# Patient Record
Sex: Male | Born: 1965 | Race: White | Hispanic: No | Marital: Married | Smoking: Never smoker
Health system: Southern US, Community
[De-identification: ages and names within clinical notes are randomized; demographics above are authoritative.]

## PROBLEM LIST (undated history)

## (undated) DIAGNOSIS — E782 Mixed hyperlipidemia: Secondary | ICD-10-CM

## (undated) DIAGNOSIS — K859 Acute pancreatitis without necrosis or infection, unspecified: Secondary | ICD-10-CM

## (undated) DIAGNOSIS — J189 Pneumonia, unspecified organism: Secondary | ICD-10-CM

## (undated) DIAGNOSIS — G4730 Sleep apnea, unspecified: Secondary | ICD-10-CM

## (undated) DIAGNOSIS — I10 Essential (primary) hypertension: Secondary | ICD-10-CM

## (undated) DIAGNOSIS — E119 Type 2 diabetes mellitus without complications: Secondary | ICD-10-CM

## (undated) DIAGNOSIS — N200 Calculus of kidney: Secondary | ICD-10-CM

## (undated) DIAGNOSIS — IMO0002 Reserved for concepts with insufficient information to code with codable children: Secondary | ICD-10-CM

## (undated) HISTORY — PX: EYE SURGERY: SHX253

---

## 1997-01-31 DIAGNOSIS — K859 Acute pancreatitis without necrosis or infection, unspecified: Secondary | ICD-10-CM

## 1997-01-31 HISTORY — DX: Acute pancreatitis without necrosis or infection, unspecified: K85.90

## 2001-05-16 ENCOUNTER — Encounter: Payer: Self-pay | Admitting: Emergency Medicine

## 2001-05-16 ENCOUNTER — Emergency Department (HOSPITAL_COMMUNITY): Admission: EM | Admit: 2001-05-16 | Discharge: 2001-05-16 | Payer: Self-pay | Admitting: Emergency Medicine

## 2002-05-31 ENCOUNTER — Emergency Department (HOSPITAL_COMMUNITY): Admission: EM | Admit: 2002-05-31 | Discharge: 2002-05-31 | Payer: Self-pay | Admitting: *Deleted

## 2002-06-01 ENCOUNTER — Emergency Department (HOSPITAL_COMMUNITY): Admission: EM | Admit: 2002-06-01 | Discharge: 2002-06-01 | Payer: Self-pay | Admitting: Emergency Medicine

## 2009-11-16 ENCOUNTER — Emergency Department (HOSPITAL_COMMUNITY): Admission: EM | Admit: 2009-11-16 | Discharge: 2009-11-16 | Payer: Self-pay | Admitting: Emergency Medicine

## 2011-06-15 ENCOUNTER — Encounter (INDEPENDENT_AMBULATORY_CARE_PROVIDER_SITE_OTHER): Payer: BC Managed Care – PPO | Admitting: Ophthalmology

## 2011-06-15 DIAGNOSIS — H4310 Vitreous hemorrhage, unspecified eye: Secondary | ICD-10-CM

## 2011-06-15 DIAGNOSIS — E11359 Type 2 diabetes mellitus with proliferative diabetic retinopathy without macular edema: Secondary | ICD-10-CM

## 2011-06-15 DIAGNOSIS — H43819 Vitreous degeneration, unspecified eye: Secondary | ICD-10-CM

## 2011-06-15 DIAGNOSIS — H35039 Hypertensive retinopathy, unspecified eye: Secondary | ICD-10-CM

## 2011-06-15 DIAGNOSIS — H2510 Age-related nuclear cataract, unspecified eye: Secondary | ICD-10-CM

## 2011-06-23 ENCOUNTER — Encounter (INDEPENDENT_AMBULATORY_CARE_PROVIDER_SITE_OTHER): Payer: BC Managed Care – PPO | Admitting: Ophthalmology

## 2011-06-23 DIAGNOSIS — E1065 Type 1 diabetes mellitus with hyperglycemia: Secondary | ICD-10-CM

## 2011-06-23 DIAGNOSIS — H4310 Vitreous hemorrhage, unspecified eye: Secondary | ICD-10-CM

## 2011-06-23 DIAGNOSIS — E11311 Type 2 diabetes mellitus with unspecified diabetic retinopathy with macular edema: Secondary | ICD-10-CM

## 2011-06-23 DIAGNOSIS — H35039 Hypertensive retinopathy, unspecified eye: Secondary | ICD-10-CM

## 2011-06-23 DIAGNOSIS — H43819 Vitreous degeneration, unspecified eye: Secondary | ICD-10-CM

## 2011-06-23 DIAGNOSIS — E1039 Type 1 diabetes mellitus with other diabetic ophthalmic complication: Secondary | ICD-10-CM

## 2011-06-23 DIAGNOSIS — E11359 Type 2 diabetes mellitus with proliferative diabetic retinopathy without macular edema: Secondary | ICD-10-CM

## 2011-06-23 DIAGNOSIS — I10 Essential (primary) hypertension: Secondary | ICD-10-CM

## 2011-06-29 LAB — PULMONARY FUNCTION TEST

## 2011-07-04 ENCOUNTER — Other Ambulatory Visit (INDEPENDENT_AMBULATORY_CARE_PROVIDER_SITE_OTHER): Payer: BC Managed Care – PPO | Admitting: Ophthalmology

## 2011-07-04 DIAGNOSIS — H3581 Retinal edema: Secondary | ICD-10-CM

## 2011-07-21 ENCOUNTER — Encounter (INDEPENDENT_AMBULATORY_CARE_PROVIDER_SITE_OTHER): Payer: BC Managed Care – PPO | Admitting: Ophthalmology

## 2011-07-21 DIAGNOSIS — H43819 Vitreous degeneration, unspecified eye: Secondary | ICD-10-CM

## 2011-07-21 DIAGNOSIS — E1139 Type 2 diabetes mellitus with other diabetic ophthalmic complication: Secondary | ICD-10-CM

## 2011-07-21 DIAGNOSIS — E11311 Type 2 diabetes mellitus with unspecified diabetic retinopathy with macular edema: Secondary | ICD-10-CM

## 2011-07-21 DIAGNOSIS — I10 Essential (primary) hypertension: Secondary | ICD-10-CM

## 2011-07-21 DIAGNOSIS — E11359 Type 2 diabetes mellitus with proliferative diabetic retinopathy without macular edema: Secondary | ICD-10-CM

## 2011-07-21 DIAGNOSIS — H35039 Hypertensive retinopathy, unspecified eye: Secondary | ICD-10-CM

## 2011-08-03 ENCOUNTER — Ambulatory Visit (INDEPENDENT_AMBULATORY_CARE_PROVIDER_SITE_OTHER): Payer: BC Managed Care – PPO | Admitting: Ophthalmology

## 2011-08-03 DIAGNOSIS — E1039 Type 1 diabetes mellitus with other diabetic ophthalmic complication: Secondary | ICD-10-CM

## 2011-08-03 DIAGNOSIS — E11359 Type 2 diabetes mellitus with proliferative diabetic retinopathy without macular edema: Secondary | ICD-10-CM

## 2011-08-03 DIAGNOSIS — H4310 Vitreous hemorrhage, unspecified eye: Secondary | ICD-10-CM

## 2011-08-03 NOTE — H&P (Signed)
[name] is an 46 y.o. male.   Chief Complaint: poor vision left eye HPI: longstanding diabetic with vitreous hemorrhage left eye  No past medical history on file.  No past surgical history on file.  No family history on file. Social History:  does not have a smoking history on file. He does not have any smokeless tobacco history on file. His alcohol and drug histories not on file.  Allergies: Allergies not on file  No prescriptions prior to admission    Review of systems otherwise negative  There were no vitals taken for this visit.  Physical exam: Mental status: oriented x3. Eyes: See eye exam associated with this date of surgery in media tab.  Scanned in by scanning center Ears, Nose, Throat: within normal limits Neck: Within Normal limits General: within normal limits Chest: Within normal limits Breast: deferred Heart: Within normal limits Abdomen: Within normal limits LARGE GU: deferred Extremities: within normal limits Skin: within normal limits  Assessment/Plan Vitreous hemorrhage, proliferative diabetic retinopathy Plan: To [facility] for pars plana vitrectomy left eye with laser treatment, gas injection, membrane peel.  Sherrie George 08/03/2011, 12:07 PM

## 2011-08-08 ENCOUNTER — Encounter: Payer: Self-pay | Admitting: Cardiovascular Disease

## 2011-08-19 ENCOUNTER — Encounter (HOSPITAL_COMMUNITY): Payer: Self-pay

## 2011-08-29 ENCOUNTER — Encounter (HOSPITAL_COMMUNITY): Payer: Self-pay | Admitting: *Deleted

## 2011-08-29 ENCOUNTER — Encounter (HOSPITAL_COMMUNITY): Payer: Self-pay | Admitting: Vascular Surgery

## 2011-08-29 MED ORDER — GATIFLOXACIN 0.5 % OP SOLN: 1.0000 [drp] | OPHTHALMIC | Status: DC | PRN

## 2011-08-29 MED ORDER — PHENYLEPHRINE HCL 2.5 % OP SOLN: 1.0000 [drp] | OPHTHALMIC | Status: DC | PRN

## 2011-08-29 MED ORDER — DEXTROSE 5 % IV SOLN
3.0000 g | INTRAVENOUS | Status: DC
  Filled 2011-08-29: qty 3000

## 2011-08-29 MED ORDER — TROPICAMIDE 1 % OP SOLN: 1.0000 [drp] | OPHTHALMIC | Status: DC | PRN

## 2011-08-29 MED ORDER — CYCLOPENTOLATE HCL 1 % OP SOLN: 1.0000 [drp] | OPHTHALMIC | Status: DC | PRN

## 2011-08-29 NOTE — Consult Note (Signed)
Anesthesia Chart Review:  Patient is a 46 year old male scheduled for pars plana vitrectomy with laser treatment, gas injection, membrane peel, left eye on 08/30/11 by Dr. Ashley Royalty.  He is scheduled to be a same day work-up per Dr. Anastasio Auerbach office.  History includes morbid obesity (last documented weight in Epic is 310 with BMI 50), non-smoker, HTN, DM2, pancreatitis '99, OSA type sleeping patterns but no official diagnosis, hypercholesterolemia, kidney stones.  Endocrinologist is Dr. Lacretia Nicks. Adela Lank.  Labs from his office on 07/01/11 indicate that he probably has some underlying CKD as his Cr then was 1.8.  His PCP is Dr. Jeannetta Nap with [facility].  He is out of the office this afternoon, but I spoke with his PA Rosanne Ashing.  He apparently gave medical clearance for this procedure.  EKG (PCP) on 06/23/11 showed ST @ 121, right axis deviation, right BBB, anteroseptal infarct (age undetermined), inferior ST/T wave abnormality, low voltage in precordial leads.  He had a cardiopulmonary exercise test on 06/29/11 that showed an "abnormal response", indeterminate for myocardial dysfunction due to suboptimal peak cardiovascular stress load, severe resting tachycardia, accelerated diastolic blood pressure response with exercise, risk assessment for high risk surgery was indeterminate.  I left a message for Dr. Marylen Ponto to contact me.  I also reviewed with Anesthesiologist Dr. Jacklynn Bue.  Based on his risk factors and stress test results, recommend pre-operative Cardiology evaluation.  I notified Dr. Ashley Royalty.  I called an spoke with Mr. Heinlen as well.  I notified him that [facility] can see him on 09/19/11.  Currently he is very upset, and isn't sure if he will even agree to see a Cardiologist or have eye surgery.  He is going to contact Dr. Anastasio Auerbach office.  I updated Lisa at Dr. Anastasio Auerbach office.  Shonna Chock, PA-C

## 2011-08-30 ENCOUNTER — Ambulatory Visit (HOSPITAL_COMMUNITY): Admission: RE | Admit: 2011-08-30 | Payer: BC Managed Care – PPO | Source: Ambulatory Visit | Admitting: Ophthalmology

## 2011-08-30 ENCOUNTER — Encounter (HOSPITAL_COMMUNITY): Admission: RE | Payer: Self-pay | Source: Ambulatory Visit

## 2011-08-30 HISTORY — DX: Acute pancreatitis without necrosis or infection, unspecified: K85.90

## 2011-08-30 HISTORY — DX: Mixed hyperlipidemia: E78.2

## 2011-08-30 HISTORY — DX: Essential (primary) hypertension: I10

## 2011-08-30 HISTORY — DX: Sleep apnea, unspecified: G47.30

## 2011-08-30 SURGERY — PARS PLANA VITRECTOMY WITH 25 GAUGE
Anesthesia: General | Laterality: Left

## 2011-09-06 ENCOUNTER — Ambulatory Visit (INDEPENDENT_AMBULATORY_CARE_PROVIDER_SITE_OTHER): Payer: BC Managed Care – PPO | Admitting: Ophthalmology

## 2011-09-19 ENCOUNTER — Encounter: Payer: Self-pay | Admitting: *Deleted

## 2011-09-19 ENCOUNTER — Encounter: Payer: Self-pay | Admitting: Cardiovascular Disease

## 2011-09-19 ENCOUNTER — Ambulatory Visit (INDEPENDENT_AMBULATORY_CARE_PROVIDER_SITE_OTHER): Payer: BC Managed Care – PPO | Admitting: Cardiovascular Disease

## 2011-09-19 VITALS — BP 132/77 | HR 119 | Wt 309.0 lb

## 2011-09-19 DIAGNOSIS — Z01810 Encounter for preprocedural cardiovascular examination: Secondary | ICD-10-CM

## 2011-09-19 DIAGNOSIS — R0600 Dyspnea, unspecified: Secondary | ICD-10-CM

## 2011-09-19 DIAGNOSIS — R0989 Other specified symptoms and signs involving the circulatory and respiratory systems: Secondary | ICD-10-CM

## 2011-09-19 DIAGNOSIS — I10 Essential (primary) hypertension: Secondary | ICD-10-CM | POA: Insufficient documentation

## 2011-09-19 DIAGNOSIS — N189 Chronic kidney disease, unspecified: Secondary | ICD-10-CM | POA: Insufficient documentation

## 2011-09-19 DIAGNOSIS — R0609 Other forms of dyspnea: Secondary | ICD-10-CM

## 2011-09-19 DIAGNOSIS — I4892 Unspecified atrial flutter: Secondary | ICD-10-CM

## 2011-09-19 DIAGNOSIS — E782 Mixed hyperlipidemia: Secondary | ICD-10-CM | POA: Insufficient documentation

## 2011-09-19 DIAGNOSIS — K859 Acute pancreatitis without necrosis or infection, unspecified: Secondary | ICD-10-CM | POA: Insufficient documentation

## 2011-09-19 DIAGNOSIS — G4730 Sleep apnea, unspecified: Secondary | ICD-10-CM | POA: Insufficient documentation

## 2011-09-19 DIAGNOSIS — E119 Type 2 diabetes mellitus without complications: Secondary | ICD-10-CM

## 2011-09-19 DIAGNOSIS — E669 Obesity, unspecified: Secondary | ICD-10-CM | POA: Insufficient documentation

## 2011-09-19 DIAGNOSIS — I4891 Unspecified atrial fibrillation: Secondary | ICD-10-CM

## 2011-09-19 DIAGNOSIS — E1111 Type 2 diabetes mellitus with ketoacidosis with coma: Secondary | ICD-10-CM | POA: Insufficient documentation

## 2011-09-19 DIAGNOSIS — H4310 Vitreous hemorrhage, unspecified eye: Secondary | ICD-10-CM

## 2011-09-19 DIAGNOSIS — Z01818 Encounter for other preprocedural examination: Secondary | ICD-10-CM

## 2011-09-19 DIAGNOSIS — IMO0002 Reserved for concepts with insufficient information to code with codable children: Secondary | ICD-10-CM

## 2011-09-19 HISTORY — DX: Unspecified atrial fibrillation: I48.91

## 2011-09-19 HISTORY — DX: Reserved for concepts with insufficient information to code with codable children: IMO0002

## 2011-09-19 LAB — CBC WITH DIFFERENTIAL/PLATELET
Basophils Relative: 0.3 % (ref 0.0–3.0)
Eosinophils Relative: 3.7 % (ref 0.0–5.0)
HCT: 47.9 % (ref 39.0–52.0)
MCV: 83.8 fl (ref 78.0–100.0)
Monocytes Absolute: 0.9 10*3/uL (ref 0.1–1.0)
Neutrophils Relative %: 65.2 % (ref 43.0–77.0)
RBC: 5.72 Mil/uL (ref 4.22–5.81)
WBC: 9.5 10*3/uL (ref 4.5–10.5)

## 2011-09-19 LAB — BASIC METABOLIC PANEL
BUN: 34 mg/dL — ABNORMAL HIGH (ref 6–23)
CO2: 33 mEq/L — ABNORMAL HIGH (ref 19–32)
Chloride: 97 mEq/L (ref 96–112)
GFR: 66.05 mL/min (ref >60.00)
Glucose, Bld: 177 mg/dL — ABNORMAL HIGH (ref 70–99)
Potassium: 3.8 mEq/L (ref 3.5–5.1)

## 2011-09-19 NOTE — Patient Instructions (Addendum)
Your physician recommends that you schedule a follow-up appointment in: AFTER TEST DONE  WITH DR [facility] Your physician has recommended you make the following change in your medication: INCREASE TENORMIN TO  100 MG 2 TABS TWICE DAILY START XARELTO  20 MG  1 WITH EVENING MEAL You have been referred to  EP  FIRST AVAILABLE FOR POSSIBLE  FLUTTER ABLATION Your physician has requested that you have a lexiscan myoview. For further information please visit [email or web address] Please follow instruction sheet, as given. 2 DAY  PROTOCOL DX PRE OP Your physician has requested that you have an echocardiogram. Echocardiography is a painless test that uses sound waves to create images of your heart. It provides your doctor with information about the size and shape of your heart and how well your heart's chambers and valves are working. This procedure takes approximately one hour. There are no restrictions for this procedure. DX PRE OP  Your physician has requested that you have a TEE/Cardioversion. During a TEE, sound waves are used to create images of your heart. It provides your doctor with information about the size and shape of your heart and how well your heart's chambers and valves are working. In this test, a transducer is attached to the end of a flexible tube that is guided down you throat and into your esophagus (the tube leading from your mouth to your stomach) to get a more detailed image of your heart. Once the TEE has determined that a blood clot is not present, the cardioversion begins. Electrical Cardioversion uses a jolt of electricity to your heart either through paddles or wired patches attached to your chest. This is a controlled, usually prescheduled, procedure. This procedure is done at the hospital and you are not awake during the procedure. You usually go home the day of the procedure. Please see the instruction sheet given to you today for more information. DX AFLUTTER  DO NEXT WEEK  WITH DR  Eden Emms  AFTER TESTS Your physician recommends that you return for lab work in: TODAY  BMET CBC INR  DX V72.81  A FLUTTER

## 2011-09-19 NOTE — Assessment & Plan Note (Signed)
Cholesterol is at goal.  Continue current dose of statin and diet Rx.  No myalgias or side effects.  F/U  LFT's in 6 months. No results found for this basename: LDLCALC             

## 2011-09-19 NOTE — Assessment & Plan Note (Signed)
Responable fo rapid HR. Multiple issues regarding anticoagulation and rate control as well as Rx.  Increase atenolol.  Echo and two day myovue. Xarelto will check Cr.  TEE/DCC next week and F/U EPS for ? Flutter ablation.  Risk of stroke and rational for Rx discussed with patient

## 2011-09-19 NOTE — Progress Notes (Signed)
Patient ID: [name], male   DOB: 02/05/1965, 46 y.o.   MRN: [id number] 46 yo DM 21 years.  Sent by Dr Mathews for preop clearance Morbidly obese.  Poor diabetic control with A1 as high as 12.  No previous cardiac issues Had a cardiopulm stress test at [facility] 5/29  And could not exercise but 2.5 minutes with no obvious cardiac limitation.  Noted to have higher HR last few months.  Preop evaluation HR 115 ECG read as sinus tachycardia but is in fib/flutter.  Confirmed on ECG in our office Flutter rate of 119 on Atenolol 75mg/day already.  Denies chest pain palpitations.  Sees Dr Powell not clear if kidneys a bad.  Had edema and lost 40 lbs after being put on lasix.  More dyspnic last few weeks  Has comtemplated bariatric surgery .  Seen Das[facility] at [facility]  Dad had successful surgery.    Has diabetic retinopathy.  Has had injections and laser surgery before  Needed more surgery on left eye requiring anesthesia which was canceled due to need for cardiac  evaluaiton and tachycardia  Long discussion with patient about diagnosis of flutter.  Need for anticoagulation  Use xarelto and dose depending on BMET check today.  Continue beta blocker for rate control Will need echo and ischemic evaluation.  Discussed TEE/DCC as quickest way to restore rhythm   ROS: Denies fever, malais, weight loss, blurry vision, decreased visual acuity, cough, sputum, SOB, hemoptysis, pleuritic pain, palpitaitons, heartburn, abdominal pain, melena, lower extremity edema, claudication, or rash.  All other systems reviewed and negative   General: Affect appropriate Obese white male HEENT: normal Neck supple with no adenopathy JVP normal no bruits no thyromegaly Lungs clear with no wheezing and good diaphragmatic motion Heart:  S1/S2 no murmur,rub, gallop or click PMI normal Abdomen: benighn, BS positve, no tenderness, no AAA no bruit.  No HSM or HJR Distal pulses intact with no bruits Trace edema Neuro  non-focal Skin warm and dry No muscular weakness  Medications Current Outpatient Prescriptions  Medication Sig Dispense Refill  . aspirin EC 81 MG tablet Take 81 mg by mouth daily.      . atenolol (TENORMIN) 50 MG tablet 2 tabs am and 1 tab pm      . Canagliflozin (INVOKANA) 300 MG TABS Take 1 tablet by mouth daily.      . furosemide (LASIX) 40 MG tablet Take 40 mg by mouth daily.      . gemfibrozil (LOPID) 600 MG tablet Take 600 mg by mouth 2 (two) times daily before a meal.      . insulin aspart protamine-insulin aspart (NOVOLOG 70/30) (70-30) 100 UNIT/ML injection Inject 80 Units into the skin 2 (two) times daily with a meal.      . lisinopril (PRINIVIL,ZESTRIL) 20 MG tablet Take 20 mg by mouth daily.      . metFORMIN (GLUCOPHAGE) 1000 MG tablet Take 1,000 mg by mouth 2 (two) times daily with a meal.      . niacin 500 MG tablet Take 500 mg by mouth 2 (two) times daily with a meal.      . pravastatin (PRAVACHOL) 40 MG tablet Take 40 mg by mouth 2 (two) times daily.      . vitamin C (ASCORBIC ACID) 500 MG tablet Take 500 mg by mouth daily.       No current facility-administered medications for this visit.   Facility-Administered Medications Ordered in Other Visits  Medication Dose Route Frequency Provider   Last Rate Last Dose  . ceFAZolin (ANCEF) 3 g in dextrose 5 % 50 mL IVPB  3 g Intravenous 30 min Pre-Op John D Matthews, MD      . cyclopentolate (CYCLODRYL,CYCLOGYL) 1 % ophthalmic solution 1 drop  1 drop Left Eye PRN John D Matthews, MD      . gatifloxacin (ZYMAXID) 0.5 % ophthalmic drops 1 drop  1 drop Left Eye PRN John D Matthews, MD      . phenylephrine (MYDFRIN) 2.5 % ophthalmic solution 1 drop  1 drop Left Eye PRN John D Matthews, MD      . tropicamide (MYDRIACYL) 1 % ophthalmic solution 1 drop  1 drop Left Eye PRN John D Matthews, MD        Allergies Review of patient's allergies indicates no known allergies.  Family History: No family history on file.  Social  History: History   Social History  . Marital Status: Married    Spouse Name: N/A    Number of Children: N/A  . Years of Education: N/A   Occupational History  . Not on file.   Social History Main Topics  . Smoking status: Never Smoker   . Smokeless tobacco: Not on file  . Alcohol Use: No     Monthly - < 3 beers a month  . Drug Use: No  . Sexually Active:    Other Topics Concern  . Not on file   Social History Narrative  . No narrative on file    Electrocardiogram: Atrial flutter rate 119  RBBB  Assessment and Plan   

## 2011-09-19 NOTE — Assessment & Plan Note (Signed)
Will check BMET today to see if xarelto needs to be decreased in dosage  Try to get notes form Dr Lowell Guitar

## 2011-09-19 NOTE — Assessment & Plan Note (Signed)
Explained to him the connection between his obesity poor BS control and eye problems.  Again bariatric surgery would be his best option Discussed low carb diet.  Target hemoglobin A1c is 6.5 or less.  Continue current medications.

## 2011-09-19 NOTE — Assessment & Plan Note (Signed)
Well controlled.  Continue current medications and low sodium Dash type diet.    

## 2011-09-19 NOTE — Assessment & Plan Note (Signed)
He should have bariatric surgery.  Only thing that would materially change his life.  Will fix heart issues first then he will F/U in [facility]

## 2011-09-19 NOTE — Assessment & Plan Note (Signed)
Will have to postpone eye surgery for time being

## 2011-09-19 NOTE — Assessment & Plan Note (Signed)
Clinicall diagnosis  Advised patient to get tested as there is a relationship between this and PAF.  He would have trouble wearing CPAP.

## 2011-09-20 ENCOUNTER — Telehealth: Payer: Self-pay | Admitting: *Deleted

## 2011-09-20 ENCOUNTER — Encounter: Payer: Self-pay | Admitting: *Deleted

## 2011-09-20 NOTE — Telephone Encounter (Signed)
LEFT MESSAGE FOR PT TO CALL BACK RE SCHEDULING TEE CARDIOVERSION NEXT WEEK FOR A FLUTTER .[name]

## 2011-09-20 NOTE — Telephone Encounter (Signed)
TEE CARDIOVERSION SCHEDULED FOR 09-30-11 AT 12:00 WITH DR Eden Emms

## 2011-09-21 ENCOUNTER — Ambulatory Visit (HOSPITAL_COMMUNITY): Payer: BC Managed Care – PPO | Attending: Cardiology

## 2011-09-21 ENCOUNTER — Other Ambulatory Visit: Payer: Self-pay

## 2011-09-21 DIAGNOSIS — I4892 Unspecified atrial flutter: Secondary | ICD-10-CM | POA: Insufficient documentation

## 2011-09-21 DIAGNOSIS — I10 Essential (primary) hypertension: Secondary | ICD-10-CM | POA: Insufficient documentation

## 2011-09-21 DIAGNOSIS — E119 Type 2 diabetes mellitus without complications: Secondary | ICD-10-CM | POA: Insufficient documentation

## 2011-09-21 DIAGNOSIS — Z01818 Encounter for other preprocedural examination: Secondary | ICD-10-CM

## 2011-09-21 DIAGNOSIS — R0600 Dyspnea, unspecified: Secondary | ICD-10-CM

## 2011-09-23 ENCOUNTER — Other Ambulatory Visit: Payer: Self-pay | Admitting: Cardiovascular Disease

## 2011-09-23 DIAGNOSIS — I4892 Unspecified atrial flutter: Secondary | ICD-10-CM

## 2011-09-26 ENCOUNTER — Telehealth: Payer: Self-pay | Admitting: Cardiovascular Disease

## 2011-09-26 MED ORDER — RIVAROXABAN 20 MG PO TABS: 20.0000 mg | ORAL_TABLET | Freq: Every day | ORAL | Status: DC

## 2011-09-26 NOTE — Telephone Encounter (Signed)
Pt as given samples of xarelto, and rx was to be called into [facility] [place], and as of today that there, pls call in as ap

## 2011-09-29 ENCOUNTER — Ambulatory Visit (HOSPITAL_COMMUNITY): Payer: BC Managed Care – PPO | Admitting: Radiology

## 2011-09-29 ENCOUNTER — Encounter (HOSPITAL_COMMUNITY): Payer: Self-pay | Admitting: Radiology

## 2011-09-29 NOTE — Progress Notes (Deleted)
[facility] SITE 3 NUCLEAR MED [address] [place] [address] [phone]  Cardiology Nuclear Med Study  [name] is a 46 y.o. male     MRN : [id number]     DOB: 26-Apr-1965  Procedure Date: 09/29/2011  Nuclear Med Background Indication for Stress Test:  Evaluation for Ischemia, Abnormal EKG(Atrial Flutter), and Pending Surgical Clearance for (L) eye surgery by Dr. Alan Mulder History:  {CHL HISTORY STRESS OZHY:86578} Cardiac Risk Factors: Hypertension, IDDM Type 2, Lipids and RBBB  Symptoms:  {CHL SYMPTOMS STRESS IONG:29528413}   Nuclear Pre-Procedure Caffeine/Decaff Intake:  None > 12 hrs NPO After: 7:30am   Lungs:  {Exam; lungs brief:12271} O2 Sat: ***% on {Exam; oxygen delivery:30093}. IV 0.9% NS with Angio Cath:  22g  IV Site: R Antecubital x 1, tolerated  IV Started by:  Irean Hong, RN  Chest Size (in):  58 Cup Size: n/a  Height: 5\' 6"  (1.676 m)  Weight:  306 lb (138.801 kg)  BMI:  Body mass index is 49.39 kg/(m^2). Tech Comments:  Held atenolol x 24 hrs. Full dose Novolog insulin 70/30 this am with breakfast    Nuclear Med Study 1 or 2 day study: 2 day  Stress Test Type:  {CHL STRESS TEST KGMW:10272536}  Reading MD: Charlton Haws, MD  Order Authorizing Provider:  Charlton Haws, MD  Resting Radionuclide: Technetium 28m Tetrofosmin  Resting Radionuclide Dose: *** mCi   Stress Radionuclide:  Technetium 25m Tetrofosmin  Stress Radionuclide Dose: *** mCi           Stress Protocol Rest HR: *** Stress HR: ***  Rest BP: *** Stress BP: ***  Exercise Time (min): {NA AND WILDCARD:21589} METS: {NA AND UYQIHKVQ:25956}          Dose of Adenosine (mg):  {NA AND LOVFIEPP:29518} Dose of Lexiscan: {CHL CARD WILDCARD AND 0.4:21590} mg  Dose of Atropine (mg): {NA AND ACZYSAYT:01601} Dose of Dobutamine: {NA AND WILDCARD:21589} mcg/kg/min (at max HR)  Stress Test Technologist: {CHL LB STRESS TEST TECHNOLOGIST:21021024}  Nuclear Technologist:  {CHL LB  NUCLEAR TECHNOLOGIST:21021025}     Rest Procedure:  {CHL REST PROCEDURE NUCLEAR:21021027} Rest ECG: {CHL REST UXN:23557}  Stress Procedure:  {CHL STRESS PROCEDURE NUCLEAR:21021028} Stress ECG: {CHL CAR STRESS ECG:21561}  QPS Raw Data Images:  {CHL RAW DATA IMAGES NUC:21021029} Stress Images:  {CHL STRESS IMAGES NUC:21021030} Rest Images:  {CHL REST IMAGES NUC:21021031} Subtraction (SDS):  {CHL SUBTRACTION (SDS) NUC:21021032} Transient Ischemic Dilatation (Normal <1.22):  *** Lung/Heart Ratio (Normal <0.45):  ***  Quantitative Gated Spect Images QGS EDV:  *** ml QGS ESV:  *** ml  Impression Exercise Capacity:  {CHL EXERCISE CAPACITY NUC:21021037} BP Response:  {CHL BP RESPONSE NUC:21021038} Clinical Symptoms:  {CHL CLINICAL SYMPTOMS NUC:21021039} ECG Impression:  {CHL ECG IMPRESSION NUC:21021040} Comparison with Prior Nuclear Study: {CHL NUCLEAR STUDY COMPARISON:21562}  Overall Impression:  {CHL OVERALL IMPRESSION NUC:21021041}  LV Ejection Fraction: {CHL CARD STUDY NOT GATED:21592:o}.  LV Wall Motion:  {CHL CARD QGS:21591:o}

## 2011-09-29 NOTE — Progress Notes (Signed)
Patient ID: [name], male   DOB: May 12, 1965, 46 y.o.   MRN: [id number] Patient came in today for a Lexiscan Myoview, but due to resting HR of 143 bpm, Dr. Eden Emms was consulted and study was cancelled and rescheduled for next week. He is scheduled for a cardioversion tomorrow, 8/30, and a Rest only Myoview 9/03, and Lexiscan only 10/06/11. He was also given his AM dose of Atenolol 50 mg.(x2).    W.Jisele Liska,RT-N

## 2011-09-30 ENCOUNTER — Encounter (HOSPITAL_COMMUNITY): Payer: Self-pay | Admitting: Anesthesiology

## 2011-09-30 ENCOUNTER — Ambulatory Visit (HOSPITAL_COMMUNITY): Payer: BC Managed Care – PPO | Admitting: Anesthesiology

## 2011-09-30 ENCOUNTER — Ambulatory Visit (HOSPITAL_COMMUNITY)
Admission: RE | Admit: 2011-09-30 | Discharge: 2011-09-30 | Disposition: A | Discharge status: Home / Self Care | Payer: BC Managed Care – PPO | Source: Ambulatory Visit | Attending: Cardiovascular Disease | Admitting: Cardiovascular Disease

## 2011-09-30 ENCOUNTER — Encounter (HOSPITAL_COMMUNITY)
Admission: RE | Disposition: A | Discharge status: Home / Self Care | Payer: Self-pay | Source: Ambulatory Visit | Attending: Cardiovascular Disease

## 2011-09-30 ENCOUNTER — Encounter (HOSPITAL_COMMUNITY): Payer: Self-pay | Admitting: *Deleted

## 2011-09-30 DIAGNOSIS — Z794 Long term (current) use of insulin: Secondary | ICD-10-CM | POA: Insufficient documentation

## 2011-09-30 DIAGNOSIS — Z79899 Other long term (current) drug therapy: Secondary | ICD-10-CM | POA: Insufficient documentation

## 2011-09-30 DIAGNOSIS — I4510 Unspecified right bundle-branch block: Secondary | ICD-10-CM | POA: Insufficient documentation

## 2011-09-30 DIAGNOSIS — E669 Obesity, unspecified: Secondary | ICD-10-CM | POA: Insufficient documentation

## 2011-09-30 DIAGNOSIS — E11319 Type 2 diabetes mellitus with unspecified diabetic retinopathy without macular edema: Secondary | ICD-10-CM | POA: Insufficient documentation

## 2011-09-30 DIAGNOSIS — I4892 Unspecified atrial flutter: Secondary | ICD-10-CM

## 2011-09-30 DIAGNOSIS — Z7902 Long term (current) use of antithrombotics/antiplatelets: Secondary | ICD-10-CM | POA: Insufficient documentation

## 2011-09-30 DIAGNOSIS — E1139 Type 2 diabetes mellitus with other diabetic ophthalmic complication: Secondary | ICD-10-CM | POA: Insufficient documentation

## 2011-09-30 HISTORY — DX: Reserved for concepts with insufficient information to code with codable children: IMO0002

## 2011-09-30 HISTORY — PX: TEE WITHOUT CARDIOVERSION: SHX5443

## 2011-09-30 HISTORY — PX: CARDIOVERSION: SHX1299

## 2011-09-30 SURGERY — ECHOCARDIOGRAM, TRANSESOPHAGEAL
Anesthesia: Monitor Anesthesia Care

## 2011-09-30 MED ORDER — SODIUM CHLORIDE 0.9 % IV SOLN
INTRAVENOUS | Status: DC | PRN
  Administered 2011-09-30: 12:00:00 via INTRAVENOUS

## 2011-09-30 MED ORDER — SODIUM CHLORIDE 0.9 % IV SOLN: INTRAVENOUS | Status: DC

## 2011-09-30 MED ORDER — BUTAMBEN-TETRACAINE-BENZOCAINE 2-2-14 % EX AERO
INHALATION_SPRAY | CUTANEOUS | Status: DC | PRN
  Administered 2011-09-30: 2 via TOPICAL

## 2011-09-30 MED ORDER — MIDAZOLAM HCL 10 MG/2ML IJ SOLN
INTRAMUSCULAR | Status: DC | PRN
  Administered 2011-09-30: 3 mg via INTRAVENOUS
  Administered 2011-09-30: 2 mg via INTRAVENOUS

## 2011-09-30 MED ORDER — MIDAZOLAM HCL 5 MG/ML IJ SOLN
INTRAMUSCULAR | Status: AC
  Filled 2011-09-30: qty 1

## 2011-09-30 MED ORDER — FENTANYL CITRATE 0.05 MG/ML IJ SOLN
INTRAMUSCULAR | Status: AC
  Filled 2011-09-30: qty 2

## 2011-09-30 MED ORDER — PROPOFOL 10 MG/ML IV EMUL
INTRAVENOUS | Status: DC | PRN
  Administered 2011-09-30: 40 mg via INTRAVENOUS

## 2011-09-30 MED ORDER — FENTANYL CITRATE 0.05 MG/ML IJ SOLN
INTRAMUSCULAR | Status: DC | PRN
  Administered 2011-09-30 (×2): 25 ug via INTRAVENOUS

## 2011-09-30 NOTE — Interval H&P Note (Signed)
History and Physical Interval Note:  09/30/2011 10:32 AM  [name]  has presented today for surgery, with the diagnosis of aflutter  The various methods of treatment have been discussed with the patient and family. After consideration of risks, benefits and other options for treatment, the patient has consented to  Procedure(s) (LRB): TRANSESOPHAGEAL ECHOCARDIOGRAM (TEE) (N/A) CARDIOVERSION (N/A) as a surgical intervention .  The patient's history has been reviewed, patient examined, no change in status, stable for surgery.  I have reviewed the patient's chart and labs.  Questions were answered to the patient's satisfaction.     Charlton Haws

## 2011-09-30 NOTE — Progress Notes (Signed)
  Echocardiogram Echocardiogram Transesophageal has been performed.  Juri Dinning 09/30/2011, 12:52 PM

## 2011-09-30 NOTE — Transfer of Care (Signed)
Immediate Anesthesia Transfer of Care Note  Patient: [name]  Procedure(s) Performed: Procedure(s) (LRB): TRANSESOPHAGEAL ECHOCARDIOGRAM (TEE) (N/A) CARDIOVERSION (N/A)  Patient Location: PACU and Endoscopy Unit  Anesthesia Type: General  Level of Consciousness: awake, alert  and oriented  Airway & Oxygen Therapy: Patient Spontanous Breathing and Patient connected to nasal cannula oxygen  Post-op Assessment: Report given to PACU RN, Post -op Vital signs reviewed and stable and Patient moving all extremities X 4  Post vital signs: Reviewed and stable  Complications: No apparent anesthesia complications

## 2011-09-30 NOTE — Anesthesia Postprocedure Evaluation (Signed)
  Anesthesia Post-op Note  Patient: [name]  Procedure(s) Performed: Procedure(s) (LRB): TRANSESOPHAGEAL ECHOCARDIOGRAM (TEE) (N/A) CARDIOVERSION (N/A)  Patient Location: PACU and Endoscopy Unit  Anesthesia Type: General  Level of Consciousness: awake, alert  and oriented  Airway and Oxygen Therapy: Patient Spontanous Breathing and Patient connected to nasal cannula oxygen  Post-op Pain: none  Post-op Assessment: Post-op Vital signs reviewed, Patient's Cardiovascular Status Stable, Respiratory Function Stable, Patent Airway, No signs of Nausea or vomiting, Adequate PO intake and Pain level controlled  Post-op Vital Signs: Reviewed and stable  Complications: No apparent anesthesia complications

## 2011-09-30 NOTE — Anesthesia Postprocedure Evaluation (Signed)
  Anesthesia Post-op Note  Patient: [name]  Procedure(s) Performed: Procedure(s) (LRB): TRANSESOPHAGEAL ECHOCARDIOGRAM (TEE) (N/A) CARDIOVERSION (N/A)  Patient Location: PACU and Short Stay  Anesthesia Type: MAC  Level of Consciousness: awake  Airway and Oxygen Therapy: Patient Spontanous Breathing  Post-op Pain: mild  Post-op Assessment: Post-op Vital signs reviewed  Post-op Vital Signs: Reviewed  Complications: No apparent anesthesia complications

## 2011-09-30 NOTE — Progress Notes (Signed)
1235 After TEE procedure, anesthesia (MD and CRNA) resumed care of pt providing propofol for cardioversion.    1241 Pt cardioverted at 150 joules to sinus rhythm.

## 2011-09-30 NOTE — Progress Notes (Signed)
Chest shaved by Claudie Revering RN

## 2011-09-30 NOTE — CV Procedure (Signed)
TEE:  5mg  versed and 50ug fentanyl Mild MR normal EF 60% No LAA thrombus  DCC with 150J biphasic Atrial flutter 133 to SR rate 58 bpm  On xarelto No immediate neurologic sequelae  40mg  of Propofol  Charlton Haws

## 2011-09-30 NOTE — Anesthesia Preprocedure Evaluation (Addendum)
Anesthesia Evaluation  Patient identified by MRN, date of birth, ID band Patient awake    Reviewed: Allergy & Precautions, H&P , NPO status , Patient's Chart, lab work & pertinent test results, reviewed documented beta blocker date and time   Airway Mallampati: II      Dental  (+) Dental Advidsory Given and Teeth Intact   Pulmonary sleep apnea and Continuous Positive Airway Pressure Ventilation ,          Cardiovascular hypertension, + dysrhythmias     Neuro/Psych    GI/Hepatic   Endo/Other  Well Controlled, Type 2  Renal/GU Renal InsufficiencyRenal disease     Musculoskeletal   Abdominal   Peds  Hematology   Anesthesia Other Findings   Reproductive/Obstetrics                          Anesthesia Physical Anesthesia Plan  ASA: III  Anesthesia Plan: MAC   Post-op Pain Management:    Induction: Intravenous  Airway Management Planned: Mask  Additional Equipment:   Intra-op Plan:   Post-operative Plan:   Informed Consent:   Dental Advisory Given  Plan Discussed with: CRNA, Anesthesiologist and Surgeon  Anesthesia Plan Comments:        Anesthesia Quick Evaluation

## 2011-09-30 NOTE — H&P (View-Only) (Signed)
Patient ID: [name], male   DOB: 06-16-1965, 46 y.o.   MRN: [id number] 46 yo DM 21 years.  Sent by Dr Jerolyn Center for preop clearance Morbidly obese.  Poor diabetic control with A1 as high as 12.  No previous cardiac issues Had a cardiopulm stress test at [facility] 5/29  And could not exercise but 2.5 minutes with no obvious cardiac limitation.  Noted to have higher HR last few months.  Preop evaluation HR 115 ECG read as sinus tachycardia but is in fib/flutter.  Confirmed on ECG in our office Flutter rate of 119 on Atenolol 75mg /day already.  Denies chest pain palpitations.  Sees Dr Lowell Guitar not clear if kidneys a bad.  Had edema and lost 40 lbs after being put on lasix.  More dyspnic last few weeks  Has comtemplated bariatric surgery .  Seen Dasher at [facility]  Dad had successful surgery.    Has diabetic retinopathy.  Has had injections and laser surgery before  Needed more surgery on left eye requiring anesthesia which was canceled due to need for cardiac  evaluaiton and tachycardia  Long discussion with patient about diagnosis of flutter.  Need for anticoagulation  Use xarelto and dose depending on BMET check today.  Continue beta blocker for rate control Will need echo and ischemic evaluation.  Discussed TEE/DCC as quickest way to restore rhythm   ROS: Denies fever, malais, weight loss, blurry vision, decreased visual acuity, cough, sputum, SOB, hemoptysis, pleuritic pain, palpitaitons, heartburn, abdominal pain, melena, lower extremity edema, claudication, or rash.  All other systems reviewed and negative   General: Affect appropriate Obese white male HEENT: normal Neck supple with no adenopathy JVP normal no bruits no thyromegaly Lungs clear with no wheezing and good diaphragmatic motion Heart:  S1/S2 no murmur,rub, gallop or click PMI normal Abdomen: benighn, BS positve, no tenderness, no AAA no bruit.  No HSM or HJR Distal pulses intact with no bruits Trace edema Neuro  non-focal Skin warm and dry No muscular weakness  Medications Current Outpatient Prescriptions  Medication Sig Dispense Refill  . aspirin EC 81 MG tablet Take 81 mg by mouth daily.      Marland Kitchen atenolol (TENORMIN) 50 MG tablet 2 tabs am and 1 tab pm      . Canagliflozin (INVOKANA) 300 MG TABS Take 1 tablet by mouth daily.      . furosemide (LASIX) 40 MG tablet Take 40 mg by mouth daily.      Marland Kitchen gemfibrozil (LOPID) 600 MG tablet Take 600 mg by mouth 2 (two) times daily before a meal.      . insulin aspart protamine-insulin aspart (NOVOLOG 70/30) (70-30) 100 UNIT/ML injection Inject 80 Units into the skin 2 (two) times daily with a meal.      . lisinopril (PRINIVIL,ZESTRIL) 20 MG tablet Take 20 mg by mouth daily.      . metFORMIN (GLUCOPHAGE) 1000 MG tablet Take 1,000 mg by mouth 2 (two) times daily with a meal.      . niacin 500 MG tablet Take 500 mg by mouth 2 (two) times daily with a meal.      . pravastatin (PRAVACHOL) 40 MG tablet Take 40 mg by mouth 2 (two) times daily.      . vitamin C (ASCORBIC ACID) 500 MG tablet Take 500 mg by mouth daily.       No current facility-administered medications for this visit.   Facility-Administered Medications Ordered in Other Visits  Medication Dose Route Frequency Provider  Last Rate Last Dose  . ceFAZolin (ANCEF) 3 g in dextrose 5 % 50 mL IVPB  3 g Intravenous 30 min Pre-Op Sherrie George, MD      . cyclopentolate (CYCLODRYL,CYCLOGYL) 1 % ophthalmic solution 1 drop  1 drop Left Eye PRN Sherrie George, MD      . gatifloxacin (ZYMAXID) 0.5 % ophthalmic drops 1 drop  1 drop Left Eye PRN Sherrie George, MD      . phenylephrine (MYDFRIN) 2.5 % ophthalmic solution 1 drop  1 drop Left Eye PRN Sherrie George, MD      . tropicamide (MYDRIACYL) 1 % ophthalmic solution 1 drop  1 drop Left Eye PRN Sherrie George, MD        Allergies Review of patient's allergies indicates no known allergies.  Family History: No family history on file.  Social  History: History   Social History  . Marital Status: Married    Spouse Name: N/A    Number of Children: N/A  . Years of Education: N/A   Occupational History  . Not on file.   Social History Main Topics  . Smoking status: Never Smoker   . Smokeless tobacco: Not on file  . Alcohol Use: No     Monthly - < 3 beers a month  . Drug Use: No  . Sexually Active:    Other Topics Concern  . Not on file   Social History Narrative  . No narrative on file    Electrocardiogram: Atrial flutter rate 119  RBBB  Assessment and Plan

## 2011-09-30 NOTE — Preoperative (Signed)
Beta Blockers   Reason not to administer Beta Blockers:Not Applicable 

## 2011-10-04 ENCOUNTER — Encounter (HOSPITAL_COMMUNITY): Payer: Self-pay | Admitting: Cardiovascular Disease

## 2011-10-04 ENCOUNTER — Ambulatory Visit (HOSPITAL_COMMUNITY): Payer: BC Managed Care – PPO | Attending: Cardiology | Admitting: Radiology

## 2011-10-04 VITALS — BP 110/56 | Ht 66.0 in | Wt 304.0 lb

## 2011-10-04 DIAGNOSIS — R0609 Other forms of dyspnea: Secondary | ICD-10-CM | POA: Insufficient documentation

## 2011-10-04 DIAGNOSIS — I10 Essential (primary) hypertension: Secondary | ICD-10-CM | POA: Insufficient documentation

## 2011-10-04 DIAGNOSIS — R000 Tachycardia, unspecified: Secondary | ICD-10-CM | POA: Insufficient documentation

## 2011-10-04 DIAGNOSIS — E782 Mixed hyperlipidemia: Secondary | ICD-10-CM

## 2011-10-04 DIAGNOSIS — I4510 Unspecified right bundle-branch block: Secondary | ICD-10-CM | POA: Insufficient documentation

## 2011-10-04 DIAGNOSIS — R0989 Other specified symptoms and signs involving the circulatory and respiratory systems: Secondary | ICD-10-CM | POA: Insufficient documentation

## 2011-10-04 DIAGNOSIS — R0602 Shortness of breath: Secondary | ICD-10-CM

## 2011-10-04 DIAGNOSIS — E119 Type 2 diabetes mellitus without complications: Secondary | ICD-10-CM | POA: Insufficient documentation

## 2011-10-04 DIAGNOSIS — R002 Palpitations: Secondary | ICD-10-CM | POA: Insufficient documentation

## 2011-10-04 DIAGNOSIS — I4892 Unspecified atrial flutter: Secondary | ICD-10-CM

## 2011-10-04 MED ORDER — TECHNETIUM TC 99M TETROFOSMIN IV KIT
30.0000 | PACK | Freq: Once | INTRAVENOUS | Status: AC | PRN
  Administered 2011-10-04: 30 via INTRAVENOUS

## 2011-10-05 ENCOUNTER — Ambulatory Visit (INDEPENDENT_AMBULATORY_CARE_PROVIDER_SITE_OTHER): Payer: BC Managed Care – PPO | Admitting: Internal Medicine

## 2011-10-05 ENCOUNTER — Encounter: Payer: Self-pay | Admitting: Internal Medicine

## 2011-10-05 VITALS — BP 120/78 | HR 67 | Ht 65.0 in | Wt 306.6 lb

## 2011-10-05 DIAGNOSIS — G4730 Sleep apnea, unspecified: Secondary | ICD-10-CM

## 2011-10-05 DIAGNOSIS — I4892 Unspecified atrial flutter: Secondary | ICD-10-CM

## 2011-10-05 DIAGNOSIS — E669 Obesity, unspecified: Secondary | ICD-10-CM

## 2011-10-05 NOTE — Patient Instructions (Signed)
Your physician recommends that you schedule a follow-up appointment in: 6 months with Dr. Ladona Ridgel. You will receive a letter in the mail 1 to 2 months in advance to make an appointment if you do not receive a letter call the office to schedule the appointment.

## 2011-10-06 ENCOUNTER — Ambulatory Visit (HOSPITAL_COMMUNITY): Payer: BC Managed Care – PPO | Attending: Cardiology | Admitting: Radiology

## 2011-10-06 ENCOUNTER — Encounter: Payer: Self-pay | Admitting: Internal Medicine

## 2011-10-06 VITALS — BP 110/56

## 2011-10-06 DIAGNOSIS — Z01818 Encounter for other preprocedural examination: Secondary | ICD-10-CM | POA: Insufficient documentation

## 2011-10-06 DIAGNOSIS — I4892 Unspecified atrial flutter: Secondary | ICD-10-CM | POA: Insufficient documentation

## 2011-10-06 DIAGNOSIS — R0989 Other specified symptoms and signs involving the circulatory and respiratory systems: Secondary | ICD-10-CM | POA: Insufficient documentation

## 2011-10-06 DIAGNOSIS — R0609 Other forms of dyspnea: Secondary | ICD-10-CM | POA: Insufficient documentation

## 2011-10-06 MED ORDER — TECHNETIUM TC 99M TETROFOSMIN IV KIT
30.0000 | PACK | Freq: Once | INTRAVENOUS | Status: AC | PRN
  Administered 2011-10-06: 30 via INTRAVENOUS

## 2011-10-06 MED ORDER — REGADENOSON 0.4 MG/5ML IV SOLN
0.4000 mg | Freq: Once | INTRAVENOUS | Status: AC
  Administered 2011-10-06: 0.4 mg via INTRAVENOUS

## 2011-10-06 NOTE — Progress Notes (Signed)
HPI [name] is referred today for evaluation of atrial flutter by Dr. Eden Emms. The patient was hospitalized several weeks ago with atrial flutter and was cardioverted to NSR. He has had atrial flutter in the past and is anti-coagulated. He notes sob and weakness when he is out of rhythm. He denies chest pain or sob in NSR. He has a h/o loud snoring and is pending evaluation for a sleep study. The patient also has massive obesity with a BMI of 51! In the interim he has pursued an initial evaluation for bariatric surgery. His father has had this procedure and lost over 200 pounds. The patient denies syncope. He is fairly sedentary. No Known Allergies   Current Outpatient Prescriptions  Medication Sig Dispense Refill  . aspirin EC 81 MG tablet Take 81 mg by mouth daily.      Marland Kitchen atenolol (TENORMIN) 50 MG tablet 2 tabs am and 1 tab pm      . Canagliflozin (INVOKANA) 300 MG TABS Take 1 tablet by mouth daily.      . furosemide (LASIX) 40 MG tablet Take 40 mg by mouth daily.      Marland Kitchen gemfibrozil (LOPID) 600 MG tablet Take 600 mg by mouth 2 (two) times daily before a meal.      . insulin aspart protamine-insulin aspart (NOVOLOG 70/30) (70-30) 100 UNIT/ML injection Inject 80 Units into the skin 2 (two) times daily with a meal.      . lisinopril (PRINIVIL,ZESTRIL) 20 MG tablet Take 20 mg by mouth daily.      . metFORMIN (GLUCOPHAGE) 1000 MG tablet Take 1,000 mg by mouth 2 (two) times daily with a meal.      . niacin 500 MG tablet Take 500 mg by mouth 2 (two) times daily with a meal.      . pravastatin (PRAVACHOL) 40 MG tablet Take 40 mg by mouth 2 (two) times daily.      . Rivaroxaban (XARELTO) 20 MG TABS Take 1 tablet (20 mg total) by mouth daily.  30 tablet  12  . vitamin C (ASCORBIC ACID) 500 MG tablet Take 500 mg by mouth daily.       No current facility-administered medications for this visit.   Facility-Administered Medications Ordered in Other Visits  Medication Dose Route Frequency Provider Last  Rate Last Dose  . regadenoson (LEXISCAN) injection SOLN 0.4 mg  0.4 mg Intravenous Once Cassell Clement, MD   0.4 mg at 10/06/11 1305  . technetium tetrofosmin (TC-MYOVIEW) injection 30 milli Curie  30 milli Curie Intravenous Once PRN Cassell Clement, MD   30 milli Curie at 10/06/11 1306     Past Medical History  Diagnosis Date  . Hypertension   . Diabetes mellitus   . Chronic kidney disease     Kidney stones  . Pancreatitis 1999  . Elevated cholesterol with elevated triglycerides   . Sleep apnea     nO OFFICALLY DIAGNOSIED, SNORES LOUDLY AND WIFE HAS WITNESSED APNEA WHEN HE IS SLEEPING  . Atrial fib/flutter, transient 09/19/2011    ROS:   All systems reviewed and negative except as noted in the HPI.   Past Surgical History  Procedure Date  . No surgical history   . Tee without cardioversion 09/30/2011    Procedure: TRANSESOPHAGEAL ECHOCARDIOGRAM (TEE);  Surgeon: Wendall Stade, MD;  Location: [facility] ENDOSCOPY;  Service: Cardiovascular;  Laterality: N/A;  kristine/ebp/beverly ( or scheduling)/ time rescheduled from 1300 to 1200 talked ( mary)  . Cardioversion 09/30/2011  Procedure: CARDIOVERSION;  Surgeon: Wendall Stade, MD;  Location: [facility] ENDOSCOPY;  Service: Cardiovascular;  Laterality: N/A;     No family history on file.   History   Social History  . Marital Status: Married    Spouse Name: N/A    Number of Children: N/A  . Years of Education: N/A   Occupational History  . Not on file.   Social History Main Topics  . Smoking status: Never Smoker   . Smokeless tobacco: Not on file  . Alcohol Use: No     Monthly - < 3 beers a month  . Drug Use: No  . Sexually Active:    Other Topics Concern  . Not on file   Social History Narrative  . No narrative on file     BP 120/78  Pulse 67  Ht 5\' 5"  (1.651 m)  Wt 306 lb 9.6 oz (139.073 kg)  BMI 51.02 kg/m2  Physical Exam:  Morbidly obese appearing middle aged man, NAD HEENT: Unremarkable Neck:  No JVD, no  thyromegally Lungs:  Clear with no wheezes, rales, or rhonchi HEART:  Regular rate rhythm, no murmurs, no rubs, no clicks Abd:  soft, positive bowel sounds, massive obesity, no organomegally, no rebound, no guarding Ext:  2 plus pulses, no edema, no cyanosis, no clubbing Skin:  No rashes no nodules Neuro:  CN II through XII intact, motor grossly intact  EKG NSR with IRBBB, anterior MI .   Assess/Plan:

## 2011-10-06 NOTE — Assessment & Plan Note (Signed)
I have strongly encouraged he look into bariatric surgery. His relatively young age would be all the more incentive for him to proceed.

## 2011-10-06 NOTE — Assessment & Plan Note (Signed)
I discussed the treatment options with the patient. While he could be successfully ablated, his undiagnosed and untreated sleep apnea would almost certainly result in the development of atrial fibrillation. As he is currently maintaining NSR, I would recommend that the patient hold off on catheter ablation for now, unless he develops recurrent, symptomatic flutter.

## 2011-10-06 NOTE — Assessment & Plan Note (Signed)
He is in the process of undergoing sleep evaluation. Once he has begun to utilize CPAP/BiPAP, I think he would be more likely to stay in rhythm after atrial flutter ablation.

## 2011-10-06 NOTE — Progress Notes (Signed)
[facility] SITE 3 NUCLEAR MED [address] [place] [address] [phone]  Cardiology Nuclear Med Study  [name] is a 46 y.o. male     MRN : [id number]     DOB: 1965-08-11  Procedure Date: 10/06/2011  Nuclear Med Background Indication for Stress Test:  Evaluation for Ischemia and Surgical Clearance- (L Eye Surgery) History:  09/30/2011 Cardioversion/A-Flutter, 8/13 TEE- EF 55-60% Cardiac Risk Factors: Hypertension, IDDM Type 2, Lipids and RBBB  Symptoms:  DOE, Fatigue, Palpitations and Rapid HR   Nuclear Pre-Procedure Caffeine/Decaff Intake:  None NPO After: 8:00am   Lungs:  clear O2 Sat: 96% on room air. IV 0.9% NS with Angio Cath:  22g  IV Site: R Hand  IV Started by:  Doyne Keel, CNMT  Chest Size (in):  54+ Cup Size: n/a  Height: 5\' 6"  (1.676 m)  Weight:  304 lb (137.893 kg)  BMI:  Body mass index is 49.07 kg/(m^2). Tech Comments:  Patient took all medicines as prescribed    Nuclear Med Study 1 or 2 day study: 2 day  Stress Test Type:  Eugenie Birks  Reading MD: Cassell Clement, MD  Order Authorizing Provider:  P. Eden Emms, MD  Resting Radionuclide: Technetium 35m Tetrofosmin  Resting Radionuclide Dose: 33.0 mCi on 10/04/11   Stress Radionuclide:  Technetium 36m Tetrofosmin  Stress Radionuclide Dose: 33.0 mCi on 10/06/11           Stress Protocol Rest HR: 63 Stress HR: 71  Rest BP: 110/56 Stress BP: 111/67  Exercise Time (min): n/a METS: n/a   Predicted Max HR: 172 bpm % Max HR: 41.28 bpm Rate Pressure Product: 7881   Dose of Adenosine (mg):  n/a Dose of Lexiscan: 0.4 mg  Dose of Atropine (mg): n/a Dose of Dobutamine: n/a mcg/kg/min (at max HR)  Stress Test Technologist: Bonnita Levan, RN  Nuclear Technologist:  Domenic Polite, CNMT     Rest Procedure:  Myocardial perfusion imaging was performed at rest 45 minutes following the intravenous administration of Technetium 70m Tetrofosmin. Rest ECG: Sinus Rhythm  Stress Procedure:  The patient  received IV Lexiscan 0.4 mg over 15-seconds.  Technetium 64m Tetrofosmin injected at 30-seconds.  There were no significant changes with Lexiscan.  Quantitative spect images were obtained after a 45 minute delay. Stress ECG: No significant change from baseline ECG  QPS Raw Data Images:  Normal; no motion artifact; normal heart/lung ratio. Stress Images:  Normal homogeneous uptake in all areas of the myocardium. Rest Images:  Normal homogeneous uptake in all areas of the myocardium. Subtraction (SDS):  No evidence of ischemia. Transient Ischemic Dilatation (Normal <1.22):  0.87 Lung/Heart Ratio (Normal <0.45):  0.42  Quantitative Gated Spect Images QGS EDV:  89 ml QGS ESV:  24 ml  Impression Exercise Capacity:  Lexiscan with no exercise. BP Response:  Normal blood pressure response. Clinical Symptoms:  No chest pain. ECG Impression:  No significant ST segment change suggestive of ischemia. Comparison with Prior Nuclear Study: No images to compare  Overall Impression:  Normal stress nuclear study.  LV Ejection Fraction: 73%.  LV Wall Motion:  NL LV Function; NL Wall Motion  [employer]

## 2011-10-07 ENCOUNTER — Encounter: Payer: Self-pay | Admitting: Cardiovascular Disease

## 2011-10-07 ENCOUNTER — Ambulatory Visit (INDEPENDENT_AMBULATORY_CARE_PROVIDER_SITE_OTHER): Payer: BC Managed Care – PPO | Admitting: Cardiovascular Disease

## 2011-10-07 VITALS — BP 123/79 | HR 65 | Wt 305.0 lb

## 2011-10-07 DIAGNOSIS — E669 Obesity, unspecified: Secondary | ICD-10-CM

## 2011-10-07 DIAGNOSIS — I4892 Unspecified atrial flutter: Secondary | ICD-10-CM

## 2011-10-07 DIAGNOSIS — I10 Essential (primary) hypertension: Secondary | ICD-10-CM

## 2011-10-07 DIAGNOSIS — E119 Type 2 diabetes mellitus without complications: Secondary | ICD-10-CM

## 2011-10-07 DIAGNOSIS — E782 Mixed hyperlipidemia: Secondary | ICD-10-CM

## 2011-10-07 DIAGNOSIS — G4730 Sleep apnea, unspecified: Secondary | ICD-10-CM

## 2011-10-07 MED ORDER — ATENOLOL 50 MG PO TABS: 50.0000 mg | ORAL_TABLET | ORAL | Status: DC

## 2011-10-07 NOTE — Assessment & Plan Note (Signed)
Discussed low carb diet.  Target hemoglobin A1c is 6.5 or less.  Continue current medications.  

## 2011-10-07 NOTE — Assessment & Plan Note (Signed)
Willing to wear CPaP Primary arranging study.  Relationship between obesity sleep apnea and atrial arrhythmias discussed at length

## 2011-10-07 NOTE — Progress Notes (Signed)
PT AWARE OF TEST RESULTS AT TODAYS OFFICE VISIT./CY

## 2011-10-07 NOTE — Assessment & Plan Note (Signed)
Cholesterol is at goal.  Continue current dose of statin and diet Rx.  No myalgias or side effects.  F/U  LFT's in 6 months. No results found for this basename: LDLCALC             

## 2011-10-07 NOTE — Assessment & Plan Note (Signed)
Well controlled.  Continue current medications and low sodium Dash type diet.    

## 2011-10-07 NOTE — Assessment & Plan Note (Signed)
S/P TEE/DCC maint NSR  EF normal.  Continue xarelto 4 weeks post [facility] and then D/C Consider flutter ablation after Rx sleep apnea and weight loss

## 2011-10-07 NOTE — Assessment & Plan Note (Signed)
Referred for bariatric surgery [facility] or Dr Dasher HP or Dr Scarlette Calico

## 2011-10-07 NOTE — Patient Instructions (Signed)
Your physician wants you to follow-up in: 6 months with dr nishan   You will receive a reminder letter in the mail two months in advance. If you don't receive a letter, please call our office to schedule the follow-up appointment. Your physician recommends that you continue on your current medications as directed. Please refer to the Current Medication list given to you today. 

## 2011-10-07 NOTE — Progress Notes (Signed)
Nuclear report routed to Dr. Nishan. Kamron Portee H  

## 2011-10-07 NOTE — Progress Notes (Signed)
Patient ID: [name], male   DOB: Jan 05, 1966, 46 y.o.   MRN: [id number] 46 yo with atrial flutter.  S/P TEE/DCC on 8/30  Echo showed normal EF.  F/U myovue reviewed and normal with no ischemia or infarct.   Feels much better with no dyspnea.  Saw GT and no ablation planned.  Agree that he needs to F/U with sleep study and CPAP and then  Bariatric surgery.  Clear to have this.  No palpitatoins or chest pain.  No bleeding Told him to stop xarelto at end of month  Needs refill on atenolol  ROS: Denies fever, malais, weight loss, blurry vision, decreased visual acuity, cough, sputum, SOB, hemoptysis, pleuritic pain, palpitaitons, heartburn, abdominal pain, melena, lower extremity edema, claudication, or rash.  All other systems reviewed and negative  General: Affect appropriate Healthy:  appears stated age HEENT: normal Neck supple with no adenopathy JVP normal no bruits no thyromegaly Lungs clear with no wheezing and good diaphragmatic motion Heart:  S1/S2 no murmur, no rub, gallop or click PMI normal Abdomen: benighn, BS positve, no tenderness, no AAA no bruit.  No HSM or HJR Distal pulses intact with no bruits No edema Neuro non-focal Skin warm and dry No muscular weakness   Current Outpatient Prescriptions  Medication Sig Dispense Refill  . aspirin EC 81 MG tablet Take 81 mg by mouth daily.      Marland Kitchen atenolol (TENORMIN) 50 MG tablet Take 1 tablet (50 mg total) by mouth as directed. 2 tabs am and 1 tab pm  90 tablet  11  . Canagliflozin (INVOKANA) 300 MG TABS Take 1 tablet by mouth daily.      . furosemide (LASIX) 40 MG tablet Take 40 mg by mouth daily.      Marland Kitchen gemfibrozil (LOPID) 600 MG tablet Take 600 mg by mouth 2 (two) times daily before a meal.      . insulin aspart protamine-insulin aspart (NOVOLOG 70/30) (70-30) 100 UNIT/ML injection Inject 80 Units into the skin 2 (two) times daily with a meal.      . lisinopril (PRINIVIL,ZESTRIL) 20 MG tablet Take 20 mg by mouth daily.        . metFORMIN (GLUCOPHAGE) 1000 MG tablet Take 1,000 mg by mouth 2 (two) times daily with a meal.      . niacin 500 MG tablet Take 500 mg by mouth 2 (two) times daily with a meal.      . pravastatin (PRAVACHOL) 40 MG tablet Take 40 mg by mouth 2 (two) times daily.      . Rivaroxaban (XARELTO) 20 MG TABS Take 1 tablet (20 mg total) by mouth daily.  30 tablet  12  . vitamin C (ASCORBIC ACID) 500 MG tablet Take 500 mg by mouth daily.      Marland Kitchen DISCONTD: atenolol (TENORMIN) 50 MG tablet 2 tabs am and 1 tab pm       No current facility-administered medications for this visit.   Facility-Administered Medications Ordered in Other Visits  Medication Dose Route Frequency Provider Last Rate Last Dose  . regadenoson (LEXISCAN) injection SOLN 0.4 mg  0.4 mg Intravenous Once Cassell Clement, MD   0.4 mg at 10/06/11 1305  . technetium tetrofosmin (TC-MYOVIEW) injection 30 milli Curie  30 milli Curie Intravenous Once PRN Cassell Clement, MD   30 milli Curie at 10/06/11 1306    Allergies  Review of patient's allergies indicates no known allergies.  Electrocardiogram:  Assessment and Plan

## 2011-11-08 NOTE — Addendum Note (Signed)
Addendum  created 11/08/11 1040 by Carmela Rima, CRNA   Modules edited:Anesthesia Events

## 2011-11-08 NOTE — Addendum Note (Signed)
Addendum  created 11/08/11 1040 by Devlyn Retter F Katanya Schlie, CRNA   Modules edited:Anesthesia Events    

## 2011-11-16 ENCOUNTER — Ambulatory Visit (INDEPENDENT_AMBULATORY_CARE_PROVIDER_SITE_OTHER): Payer: BC Managed Care – PPO | Admitting: Ophthalmology

## 2011-11-16 DIAGNOSIS — H2510 Age-related nuclear cataract, unspecified eye: Secondary | ICD-10-CM

## 2011-11-16 DIAGNOSIS — H35039 Hypertensive retinopathy, unspecified eye: Secondary | ICD-10-CM

## 2011-11-16 DIAGNOSIS — E11359 Type 2 diabetes mellitus with proliferative diabetic retinopathy without macular edema: Secondary | ICD-10-CM

## 2011-11-16 DIAGNOSIS — H4310 Vitreous hemorrhage, unspecified eye: Secondary | ICD-10-CM

## 2011-11-16 DIAGNOSIS — H43819 Vitreous degeneration, unspecified eye: Secondary | ICD-10-CM

## 2011-11-16 DIAGNOSIS — I10 Essential (primary) hypertension: Secondary | ICD-10-CM

## 2011-11-16 DIAGNOSIS — E1039 Type 1 diabetes mellitus with other diabetic ophthalmic complication: Secondary | ICD-10-CM

## 2011-11-16 NOTE — H&P (Signed)
[name] is an 46 y.o. male.   Chief Complaint: severe floaters left eye  HPI: [name] diabetic with vitreous hemorrhage  Past Medical History  Diagnosis Date  . Hypertension   . Diabetes mellitus   . Chronic kidney disease     Kidney stones  . Pancreatitis 1999  . Elevated cholesterol with elevated triglycerides   . Sleep apnea     nO OFFICALLY DIAGNOSIED, SNORES LOUDLY AND WIFE HAS WITNESSED APNEA WHEN HE IS SLEEPING  . Atrial fib/flutter, transient 09/19/2011    Past Surgical History  Procedure Date  . No surgical history   . Tee without cardioversion 09/30/2011    Procedure: TRANSESOPHAGEAL ECHOCARDIOGRAM (TEE);  Surgeon: Wendall Stade, MD;  Location: [facility] ENDOSCOPY;  Service: Cardiovascular;  Laterality: N/A;  kristine/ebp/beverly ( or scheduling)/ time rescheduled from 1300 to 1200 talked ( mary)  . Cardioversion 09/30/2011    Procedure: CARDIOVERSION;  Surgeon: Wendall Stade, MD;  Location: [facility] ENDOSCOPY;  Service: Cardiovascular;  Laterality: N/A;    No family history on file. Social History:  reports that he has never smoked. He does not have any smokeless tobacco history on file. He reports that he does not drink alcohol or use illicit drugs.  Allergies: No Known Allergies  No prescriptions prior to admission    Review of systems otherwise negative  There were no vitals taken for this visit.  Physical exam: Mental status: oriented x3. Eyes: See eye exam associated with this date of surgery in media tab.  Scanned in by scanning center Ears, Nose, Throat: within normal limits Neck: Within Normal limits General: within normal limits Chest: Within normal limits Breast: deferred Heart: Within normal limits Abdomen: Within normal limits GU: deferred Extremities: within normal limits Skin: within normal limits  Assessment/Plan Diabetic vitreous hemorrhage left eye Plan: To [facility] for Pars plana vitrectomy, laser treatment, gas injection,  membrane peel left eye  MATTHEWS, JOHN D 11/16/2011, 12:40 PM

## 2011-11-23 ENCOUNTER — Encounter (HOSPITAL_COMMUNITY): Payer: Self-pay | Admitting: Pharmacy Technician

## 2011-12-07 ENCOUNTER — Encounter (HOSPITAL_COMMUNITY): Payer: Self-pay | Admitting: *Deleted

## 2011-12-07 MED ORDER — CEFAZOLIN SODIUM-DEXTROSE 2-3 GM-% IV SOLR
2.0000 g | INTRAVENOUS | Status: AC
  Administered 2011-12-08: 2 g via INTRAVENOUS
  Filled 2011-12-07: qty 50

## 2011-12-08 ENCOUNTER — Encounter (HOSPITAL_COMMUNITY): Payer: Self-pay | Admitting: Anesthesiology

## 2011-12-08 ENCOUNTER — Encounter (HOSPITAL_COMMUNITY): Payer: Self-pay | Admitting: *Deleted

## 2011-12-08 ENCOUNTER — Ambulatory Visit (HOSPITAL_COMMUNITY): Payer: BC Managed Care – PPO

## 2011-12-08 ENCOUNTER — Ambulatory Visit (HOSPITAL_COMMUNITY)
Admission: RE | Admit: 2011-12-08 | Discharge: 2011-12-09 | Disposition: A | Discharge status: Home / Self Care | Payer: BC Managed Care – PPO | Source: Ambulatory Visit | Attending: Ophthalmology | Admitting: Ophthalmology

## 2011-12-08 ENCOUNTER — Encounter (HOSPITAL_COMMUNITY): Payer: Self-pay | Admitting: General Practice

## 2011-12-08 ENCOUNTER — Encounter (HOSPITAL_COMMUNITY)
Admission: RE | Disposition: A | Discharge status: Home / Self Care | Payer: Self-pay | Source: Ambulatory Visit | Attending: Ophthalmology

## 2011-12-08 ENCOUNTER — Ambulatory Visit (HOSPITAL_COMMUNITY): Payer: BC Managed Care – PPO | Admitting: Anesthesiology

## 2011-12-08 DIAGNOSIS — H4310 Vitreous hemorrhage, unspecified eye: Secondary | ICD-10-CM | POA: Insufficient documentation

## 2011-12-08 DIAGNOSIS — E11359 Type 2 diabetes mellitus with proliferative diabetic retinopathy without macular edema: Secondary | ICD-10-CM

## 2011-12-08 DIAGNOSIS — I10 Essential (primary) hypertension: Secondary | ICD-10-CM | POA: Insufficient documentation

## 2011-12-08 DIAGNOSIS — I4891 Unspecified atrial fibrillation: Secondary | ICD-10-CM | POA: Insufficient documentation

## 2011-12-08 DIAGNOSIS — E1065 Type 1 diabetes mellitus with hyperglycemia: Secondary | ICD-10-CM

## 2011-12-08 DIAGNOSIS — E1139 Type 2 diabetes mellitus with other diabetic ophthalmic complication: Secondary | ICD-10-CM | POA: Insufficient documentation

## 2011-12-08 DIAGNOSIS — E1039 Type 1 diabetes mellitus with other diabetic ophthalmic complication: Secondary | ICD-10-CM

## 2011-12-08 DIAGNOSIS — G4730 Sleep apnea, unspecified: Secondary | ICD-10-CM | POA: Insufficient documentation

## 2011-12-08 HISTORY — PX: PARS PLANA VITRECTOMY: SHX2166

## 2011-12-08 HISTORY — DX: Calculus of kidney: N20.0

## 2011-12-08 HISTORY — DX: Type 2 diabetes mellitus without complications: E11.9

## 2011-12-08 HISTORY — DX: Pneumonia, unspecified organism: J18.9

## 2011-12-08 LAB — BASIC METABOLIC PANEL
BUN: 24 mg/dL — ABNORMAL HIGH (ref 6–23)
Calcium: 10.1 mg/dL (ref 8.4–10.5)
GFR calc Af Amer: >90 mL/min (ref >90)
GFR calc non Af Amer: >90 mL/min (ref >90)
Glucose, Bld: 156 mg/dL — ABNORMAL HIGH (ref 70–99)
Sodium: 141 mEq/L (ref 135–145)

## 2011-12-08 LAB — GLUCOSE, CAPILLARY
Glucose-Capillary: 175 mg/dL — ABNORMAL HIGH (ref 70–99)
Glucose-Capillary: 115 mg/dL — ABNORMAL HIGH (ref 70–99)
Glucose-Capillary: 304 mg/dL — ABNORMAL HIGH (ref 70–99)

## 2011-12-08 LAB — CBC
MCH: 29 pg (ref 26.0–34.0)
MCHC: 34 g/dL (ref 30.0–36.0)
Platelets: 213 10*3/uL (ref 150–400)

## 2011-12-08 LAB — SURGICAL PCR SCREEN: MRSA, PCR: NEGATIVE

## 2011-12-08 SURGERY — PARS PLANA VITRECTOMY WITH 25 GAUGE
Anesthesia: General | Laterality: Left | Wound class: Clean

## 2011-12-08 MED ORDER — BRIMONIDINE TARTRATE 0.2 % OP SOLN
1.0000 [drp] | Freq: Two times a day (BID) | OPHTHALMIC | Status: DC
  Filled 2011-12-08 (×2): qty 5

## 2011-12-08 MED ORDER — ONDANSETRON HCL 4 MG/2ML IJ SOLN: 4.0000 mg | Freq: Four times a day (QID) | INTRAMUSCULAR | Status: DC | PRN

## 2011-12-08 MED ORDER — INSULIN ASPART 100 UNIT/ML ~~LOC~~ SOLN
0.0000 [IU] | SUBCUTANEOUS | Status: DC
  Administered 2011-12-08: 11 [IU] via SUBCUTANEOUS
  Administered 2011-12-09 (×2): 8 [IU] via SUBCUTANEOUS
  Administered 2011-12-09: 5 [IU] via SUBCUTANEOUS

## 2011-12-08 MED ORDER — LIDOCAINE HCL 2 % IJ SOLN
INTRAMUSCULAR | Status: AC
  Filled 2011-12-08: qty 20

## 2011-12-08 MED ORDER — SUCCINYLCHOLINE CHLORIDE 20 MG/ML IJ SOLN
INTRAMUSCULAR | Status: DC | PRN
  Administered 2011-12-08: 100 mg via INTRAVENOUS

## 2011-12-08 MED ORDER — ASPIRIN EC 81 MG PO TBEC
81.0000 mg | DELAYED_RELEASE_TABLET | Freq: Every day | ORAL | Status: DC
  Filled 2011-12-08: qty 1

## 2011-12-08 MED ORDER — FUROSEMIDE 40 MG PO TABS
40.0000 mg | ORAL_TABLET | Freq: Every day | ORAL | Status: DC
  Filled 2011-12-08: qty 1

## 2011-12-08 MED ORDER — TEMAZEPAM 15 MG PO CAPS: 15.0000 mg | ORAL_CAPSULE | Freq: Every evening | ORAL | Status: DC | PRN

## 2011-12-08 MED ORDER — MORPHINE SULFATE 2 MG/ML IJ SOLN: 1.0000 mg | INTRAMUSCULAR | Status: DC | PRN

## 2011-12-08 MED ORDER — METFORMIN HCL 500 MG PO TABS
1000.0000 mg | ORAL_TABLET | Freq: Two times a day (BID) | ORAL | Status: DC
Start: 2011-12-08 — End: 2011-12-09
  Administered 2011-12-09: 1000 mg via ORAL
  Filled 2011-12-08 (×4): qty 2

## 2011-12-08 MED ORDER — GENTAMICIN SULFATE 40 MG/ML IJ SOLN
INTRAMUSCULAR | Status: AC
  Filled 2011-12-08: qty 2

## 2011-12-08 MED ORDER — CANAGLIFLOZIN 300 MG PO TABS: 1.0000 | ORAL_TABLET | Freq: Every day | ORAL | Status: DC

## 2011-12-08 MED ORDER — ATENOLOL 50 MG PO TABS
50.0000 mg | ORAL_TABLET | Freq: Two times a day (BID) | ORAL | Status: DC
  Filled 2011-12-08 (×3): qty 2

## 2011-12-08 MED ORDER — EPINEPHRINE HCL 1 MG/ML IJ SOLN
INTRAMUSCULAR | Status: AC
  Filled 2011-12-08: qty 1

## 2011-12-08 MED ORDER — CYCLOPENTOLATE HCL 1 % OP SOLN
1.0000 [drp] | OPHTHALMIC | Status: AC | PRN
  Administered 2011-12-08 (×3): 1 [drp] via OPHTHALMIC
  Filled 2011-12-08: qty 2

## 2011-12-08 MED ORDER — DEXAMETHASONE SODIUM PHOSPHATE 10 MG/ML IJ SOLN
INTRAMUSCULAR | Status: AC
  Filled 2011-12-08: qty 1

## 2011-12-08 MED ORDER — SODIUM CHLORIDE 0.9 % IJ SOLN
INTRAMUSCULAR | Status: DC | PRN
  Administered 2011-12-08: 13:00:00

## 2011-12-08 MED ORDER — SIMVASTATIN 10 MG PO TABS
10.0000 mg | ORAL_TABLET | Freq: Every day | ORAL | Status: DC
  Filled 2011-12-08 (×2): qty 1

## 2011-12-08 MED ORDER — MIDAZOLAM HCL 5 MG/5ML IJ SOLN
INTRAMUSCULAR | Status: DC | PRN
  Administered 2011-12-08: 2 mg via INTRAVENOUS

## 2011-12-08 MED ORDER — INSULIN ASPART PROT & ASPART (70-30 MIX) 100 UNIT/ML ~~LOC~~ SUSP
80.0000 [IU] | Freq: Two times a day (BID) | SUBCUTANEOUS | Status: DC
  Administered 2011-12-08 – 2011-12-09 (×2): 80 [IU] via SUBCUTANEOUS
  Filled 2011-12-08: qty 3

## 2011-12-08 MED ORDER — SODIUM CHLORIDE 0.9 % IV SOLN
INTRAVENOUS | Status: DC | PRN
  Administered 2011-12-08 (×2): via INTRAVENOUS

## 2011-12-08 MED ORDER — INSULIN ASPART 100 UNIT/ML ~~LOC~~ SOLN
0.0000 [IU] | Freq: Once | SUBCUTANEOUS | Status: AC
  Administered 2011-12-08: 8 [IU] via SUBCUTANEOUS

## 2011-12-08 MED ORDER — SODIUM HYALURONATE 10 MG/ML IO SOLN
INTRAOCULAR | Status: AC
  Filled 2011-12-08: qty 0.85

## 2011-12-08 MED ORDER — LISINOPRIL 20 MG PO TABS
20.0000 mg | ORAL_TABLET | Freq: Every day | ORAL | Status: DC
  Filled 2011-12-08: qty 1

## 2011-12-08 MED ORDER — INFLUENZA VIRUS VACC SPLIT PF IM SUSP
0.5000 mL | INTRAMUSCULAR | Status: DC
  Filled 2011-12-08: qty 0.5

## 2011-12-08 MED ORDER — GLYCOPYRROLATE 0.2 MG/ML IJ SOLN
INTRAMUSCULAR | Status: DC | PRN
  Administered 2011-12-08: 0.4 mg via INTRAVENOUS

## 2011-12-08 MED ORDER — POLYMYXIN B SULFATE 500000 UNITS IJ SOLR
INTRAMUSCULAR | Status: AC
  Filled 2011-12-08: qty 1

## 2011-12-08 MED ORDER — SODIUM CHLORIDE 0.9 % IV SOLN
INTRAVENOUS | Status: DC
  Administered 2011-12-08: 12:00:00 via INTRAVENOUS

## 2011-12-08 MED ORDER — TROPICAMIDE 1 % OP SOLN
1.0000 [drp] | OPHTHALMIC | Status: AC | PRN
  Administered 2011-12-08 (×3): 1 [drp] via OPHTHALMIC
  Filled 2011-12-08: qty 3

## 2011-12-08 MED ORDER — MAGNESIUM HYDROXIDE 400 MG/5ML PO SUSP: 15.0000 mL | Freq: Four times a day (QID) | ORAL | Status: DC | PRN

## 2011-12-08 MED ORDER — FENTANYL CITRATE 0.05 MG/ML IJ SOLN
INTRAMUSCULAR | Status: DC | PRN
  Administered 2011-12-08: 100 ug via INTRAVENOUS

## 2011-12-08 MED ORDER — GATIFLOXACIN 0.5 % OP SOLN
1.0000 [drp] | OPHTHALMIC | Status: AC | PRN
  Administered 2011-12-08 (×3): 1 [drp] via OPHTHALMIC
  Filled 2011-12-08: qty 2.5

## 2011-12-08 MED ORDER — NEOSTIGMINE METHYLSULFATE 1 MG/ML IJ SOLN
INTRAMUSCULAR | Status: DC | PRN
  Administered 2011-12-08: 3 mg via INTRAVENOUS

## 2011-12-08 MED ORDER — LIDOCAINE HCL (CARDIAC) 20 MG/ML IV SOLN
INTRAVENOUS | Status: DC | PRN
  Administered 2011-12-08: 80 mg via INTRAVENOUS

## 2011-12-08 MED ORDER — ACETAMINOPHEN 325 MG PO TABS: 325.0000 mg | ORAL_TABLET | ORAL | Status: DC | PRN

## 2011-12-08 MED ORDER — INSULIN GLARGINE 100 UNIT/ML ~~LOC~~ SOLN
5.0000 [IU] | Freq: Every day | SUBCUTANEOUS | Status: DC
  Administered 2011-12-08: 5 [IU] via SUBCUTANEOUS

## 2011-12-08 MED ORDER — GATIFLOXACIN 0.5 % OP SOLN
1.0000 [drp] | Freq: Four times a day (QID) | OPHTHALMIC | Status: DC
  Filled 2011-12-08: qty 2.5

## 2011-12-08 MED ORDER — DOCUSATE SODIUM 100 MG PO CAPS
100.0000 mg | ORAL_CAPSULE | Freq: Two times a day (BID) | ORAL | Status: DC
  Administered 2011-12-08: 100 mg via ORAL

## 2011-12-08 MED ORDER — EPINEPHRINE HCL 1 MG/ML IJ SOLN
INTRAOCULAR | Status: DC | PRN
  Administered 2011-12-08: 13:00:00

## 2011-12-08 MED ORDER — ONDANSETRON HCL 4 MG/2ML IJ SOLN: 4.0000 mg | Freq: Once | INTRAMUSCULAR | Status: DC | PRN

## 2011-12-08 MED ORDER — MUPIROCIN 2 % EX OINT
TOPICAL_OINTMENT | CUTANEOUS | Status: AC
  Filled 2011-12-08: qty 22

## 2011-12-08 MED ORDER — BACITRACIN-POLYMYXIN B 500-10000 UNIT/GM OP OINT
TOPICAL_OINTMENT | OPHTHALMIC | Status: DC | PRN
  Administered 2011-12-08: 1 via OPHTHALMIC

## 2011-12-08 MED ORDER — GEMFIBROZIL 600 MG PO TABS
600.0000 mg | ORAL_TABLET | Freq: Two times a day (BID) | ORAL | Status: DC
  Filled 2011-12-08 (×4): qty 1

## 2011-12-08 MED ORDER — ACETAZOLAMIDE SODIUM 500 MG IJ SOLR
INTRAMUSCULAR | Status: AC
  Filled 2011-12-08: qty 500

## 2011-12-08 MED ORDER — RIVAROXABAN 20 MG PO TABS
20.0000 mg | ORAL_TABLET | Freq: Every day | ORAL | Status: DC
  Filled 2011-12-08: qty 1

## 2011-12-08 MED ORDER — SODIUM CHLORIDE 0.45 % IV SOLN
INTRAVENOUS | Status: DC
  Administered 2011-12-08 – 2011-12-09 (×2): 20 mL/h via INTRAVENOUS

## 2011-12-08 MED ORDER — BSS IO SOLN
INTRAOCULAR | Status: AC
  Filled 2011-12-08: qty 15

## 2011-12-08 MED ORDER — HYALURONIDASE HUMAN 150 UNIT/ML IJ SOLN
INTRAMUSCULAR | Status: AC
  Filled 2011-12-08: qty 1

## 2011-12-08 MED ORDER — PHENYLEPHRINE HCL 10 MG/ML IJ SOLN
INTRAMUSCULAR | Status: DC | PRN
  Administered 2011-12-08: 120 ug via INTRAVENOUS
  Administered 2011-12-08 (×2): 80 ug via INTRAVENOUS

## 2011-12-08 MED ORDER — BACITRACIN-POLYMYXIN B 500-10000 UNIT/GM OP OINT
1.0000 "application " | TOPICAL_OINTMENT | Freq: Four times a day (QID) | OPHTHALMIC | Status: DC
  Filled 2011-12-08: qty 3.5

## 2011-12-08 MED ORDER — BACITRACIN-POLYMYXIN B 500-10000 UNIT/GM OP OINT
TOPICAL_OINTMENT | OPHTHALMIC | Status: AC
  Filled 2011-12-08: qty 3.5

## 2011-12-08 MED ORDER — MINERAL OIL LIGHT 100 % EX OIL
TOPICAL_OIL | CUTANEOUS | Status: AC
  Filled 2011-12-08: qty 25

## 2011-12-08 MED ORDER — PREDNISOLONE ACETATE 1 % OP SUSP
1.0000 [drp] | Freq: Four times a day (QID) | OPHTHALMIC | Status: DC
  Filled 2011-12-08: qty 1
  Filled 2011-12-08: qty 5

## 2011-12-08 MED ORDER — TETRACAINE HCL 0.5 % OP SOLN
2.0000 [drp] | Freq: Once | OPHTHALMIC | Status: DC
  Filled 2011-12-08: qty 2

## 2011-12-08 MED ORDER — NIACIN 500 MG PO TABS
500.0000 mg | ORAL_TABLET | Freq: Two times a day (BID) | ORAL | Status: DC
  Filled 2011-12-08 (×4): qty 1

## 2011-12-08 MED ORDER — HEMOSTATIC AGENTS (NO CHARGE) OPTIME
TOPICAL | Status: DC | PRN
  Administered 2011-12-08: 1 via TOPICAL

## 2011-12-08 MED ORDER — ROCURONIUM BROMIDE 100 MG/10ML IV SOLN
INTRAVENOUS | Status: DC | PRN
  Administered 2011-12-08: 30 mg via INTRAVENOUS

## 2011-12-08 MED ORDER — BUPIVACAINE HCL (PF) 0.75 % IJ SOLN
INTRAMUSCULAR | Status: DC | PRN
  Administered 2011-12-08: 10 mL

## 2011-12-08 MED ORDER — PROPOFOL 10 MG/ML IV BOLUS
INTRAVENOUS | Status: DC | PRN
  Administered 2011-12-08: 170 mg via INTRAVENOUS

## 2011-12-08 MED ORDER — HYPROMELLOSE (GONIOSCOPIC) 2.5 % OP SOLN
OPHTHALMIC | Status: AC
  Filled 2011-12-08: qty 15

## 2011-12-08 MED ORDER — BSS PLUS IO SOLN
INTRAOCULAR | Status: AC
  Filled 2011-12-08: qty 500

## 2011-12-08 MED ORDER — ACETAZOLAMIDE SODIUM 500 MG IJ SOLR
500.0000 mg | Freq: Once | INTRAMUSCULAR | Status: AC
  Administered 2011-12-09: 500 mg via INTRAVENOUS
  Filled 2011-12-08: qty 500

## 2011-12-08 MED ORDER — HYDROCODONE-ACETAMINOPHEN 5-325 MG PO TABS: 1.0000 | ORAL_TABLET | ORAL | Status: DC | PRN

## 2011-12-08 MED ORDER — HYDROMORPHONE HCL PF 1 MG/ML IJ SOLN: 0.2500 mg | INTRAMUSCULAR | Status: DC | PRN

## 2011-12-08 MED ORDER — ATROPINE SULFATE 1 % OP SOLN
OPHTHALMIC | Status: DC | PRN
  Administered 2011-12-08: 1 [drp] via OPHTHALMIC

## 2011-12-08 MED ORDER — LATANOPROST 0.005 % OP SOLN
1.0000 [drp] | Freq: Every day | OPHTHALMIC | Status: DC
  Filled 2011-12-08 (×2): qty 2.5

## 2011-12-08 MED ORDER — VITAMIN C 500 MG PO TABS
500.0000 mg | ORAL_TABLET | Freq: Every day | ORAL | Status: DC
  Filled 2011-12-08: qty 1

## 2011-12-08 MED ORDER — PHENYLEPHRINE HCL 2.5 % OP SOLN
1.0000 [drp] | OPHTHALMIC | Status: AC | PRN
  Administered 2011-12-08 (×3): 1 [drp] via OPHTHALMIC
  Filled 2011-12-08: qty 3

## 2011-12-08 MED ORDER — DEXAMETHASONE SODIUM PHOSPHATE 10 MG/ML IJ SOLN
INTRAMUSCULAR | Status: DC | PRN
  Administered 2011-12-08: 10 mg

## 2011-12-08 MED ORDER — ATENOLOL 100 MG PO TABS: 100.0000 mg | ORAL_TABLET | Freq: Once | ORAL | Status: DC

## 2011-12-08 MED ORDER — MUPIROCIN 2 % EX OINT
TOPICAL_OINTMENT | Freq: Two times a day (BID) | CUTANEOUS | Status: DC
  Administered 2011-12-08: 10:00:00 via NASAL
  Filled 2011-12-08: qty 22

## 2011-12-08 MED ORDER — ATROPINE SULFATE 1 % OP SOLN
OPHTHALMIC | Status: AC
  Filled 2011-12-08: qty 2

## 2011-12-08 SURGICAL SUPPLY — 64 items
APPLICATOR DR MATTHEWS STRL (MISCELLANEOUS) IMPLANT
BLADE EYE CATARACT 19 1.4 BEAV (BLADE) IMPLANT
BLADE MVR KNIFE 19G (BLADE) IMPLANT
BLADE MVR KNIFE 20G (BLADE) IMPLANT
CANNULA DUAL BORE 23G (CANNULA) IMPLANT
CANNULA FLEX TIP 25G (CANNULA) IMPLANT
CLOTH BEACON ORANGE TIMEOUT ST (SAFETY) ×2 IMPLANT
CORDS BIPOLAR (ELECTRODE) IMPLANT
COTTONBALL LRG STERILE PKG (GAUZE/BANDAGES/DRESSINGS) ×6 IMPLANT
DRAPE INCISE 51X51 W/FILM STRL (DRAPES) IMPLANT
DRAPE OPHTHALMIC 77X100 STRL (CUSTOM PROCEDURE TRAY) ×2 IMPLANT
FILTER BLUE MILLIPORE (MISCELLANEOUS) IMPLANT
FILTER STRAW FLUID ASPIR (MISCELLANEOUS) IMPLANT
FORCEPS ECKARDT ILM 25G SERR (OPHTHALMIC RELATED) IMPLANT
GLOVE SS BIOGEL STRL SZ 6.5 (GLOVE) ×1 IMPLANT
GLOVE SS BIOGEL STRL SZ 7 (GLOVE) ×1 IMPLANT
GLOVE SUPERSENSE BIOGEL SZ 6.5 (GLOVE) ×1
GLOVE SUPERSENSE BIOGEL SZ 7 (GLOVE) ×1
GLOVE SURG 8.5 LATEX PF (GLOVE) ×2 IMPLANT
GOWN STRL NON-REIN LRG LVL3 (GOWN DISPOSABLE) ×6 IMPLANT
ILLUMINATOR CHOW PICK 25GA (MISCELLANEOUS) ×2 IMPLANT
KIT BASIN OR (CUSTOM PROCEDURE TRAY) ×2 IMPLANT
KIT ROOM TURNOVER OR (KITS) ×2 IMPLANT
KNIFE CRESCENT 2.5 55 ANG (BLADE) IMPLANT
LENS BIOM SUPER VIEW SET DISP (OPHTHALMIC RELATED) IMPLANT
MARKER SKIN DUAL TIP RULER LAB (MISCELLANEOUS) IMPLANT
MASK EYE SHIELD (GAUZE/BANDAGES/DRESSINGS) ×2 IMPLANT
MICROPICK 25G (MISCELLANEOUS)
NEEDLE 18GX1X1/2 (RX/OR ONLY) (NEEDLE) ×2 IMPLANT
NEEDLE 25GX 5/8IN NON SAFETY (NEEDLE) ×2 IMPLANT
NEEDLE 27GAX1X1/2 (NEEDLE) IMPLANT
NEEDLE FILTER BLUNT 18X 1/2SAF (NEEDLE) ×1
NEEDLE FILTER BLUNT 18X1 1/2 (NEEDLE) ×1 IMPLANT
NEEDLE HYPO 30X.5 LL (NEEDLE) ×2 IMPLANT
NS IRRIG 1000ML POUR BTL (IV SOLUTION) ×2 IMPLANT
PACK VITRECTOMY CUSTOM (CUSTOM PROCEDURE TRAY) ×2 IMPLANT
PAD ARMBOARD 7.5X6 YLW CONV (MISCELLANEOUS) ×4 IMPLANT
PAD EYE OVAL STERILE LF (GAUZE/BANDAGES/DRESSINGS) ×2 IMPLANT
PAK VITRECTOMY PIK 25 GA (OPHTHALMIC RELATED) ×2 IMPLANT
PENCIL BIPOLAR 25GA STR DISP (OPHTHALMIC RELATED) IMPLANT
PICK MICROPICK 25G (MISCELLANEOUS) IMPLANT
PROBE DIRECTIONAL LASER (MISCELLANEOUS) ×2 IMPLANT
REPL STRA BRUSH NEEDLE (NEEDLE) IMPLANT
RESERVOIR BACK FLUSH (MISCELLANEOUS) IMPLANT
ROLLS DENTAL (MISCELLANEOUS) ×4 IMPLANT
SCRAPER DIAMOND DUST MEMBRANE (MISCELLANEOUS) IMPLANT
SPONGE SURGIFOAM ABS GEL 12-7 (HEMOSTASIS) ×2 IMPLANT
STOPCOCK 4 WAY LG BORE MALE ST (IV SETS) IMPLANT
SUT CHROMIC 7 0 TG140 8 (SUTURE) IMPLANT
SUT ETHILON 10 0 CS140 6 (SUTURE) IMPLANT
SUT ETHILON 9 0 TG140 8 (SUTURE) IMPLANT
SUT POLY NON ABSORB 10-0 8 STR (SUTURE) IMPLANT
SUT SILK 4 0 RB 1 (SUTURE) IMPLANT
SYR 20CC LL (SYRINGE) ×2 IMPLANT
SYR 5ML LL (SYRINGE) IMPLANT
SYR BULB 3OZ (MISCELLANEOUS) ×2 IMPLANT
SYR TB 1ML LUER SLIP (SYRINGE) ×2 IMPLANT
SYRINGE 10CC LL (SYRINGE) IMPLANT
TAPE SURG TRANSPORE 1 IN (GAUZE/BANDAGES/DRESSINGS) ×1 IMPLANT
TAPE SURGICAL TRANSPORE 1 IN (GAUZE/BANDAGES/DRESSINGS) ×1
TOWEL OR 17X24 6PK STRL BLUE (TOWEL DISPOSABLE) ×6 IMPLANT
TROCAR CANNULA 25GA (CANNULA) IMPLANT
WATER STERILE IRR 1000ML POUR (IV SOLUTION) ×2 IMPLANT
WIPE INSTRUMENT VISIWIPE 73X73 (MISCELLANEOUS) ×2 IMPLANT

## 2011-12-08 NOTE — Brief Op Note (Signed)
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  Assistant:  Rosalie Doctor SA    Anesthesia: General  Specimen: none  Estimated blood loss:  1cc  Complications: none  Patient sent to PACU in good condition  Composed by Sherrie George MD  Dictation number: [phone]

## 2011-12-08 NOTE — Progress Notes (Signed)
Patient arrived in or holding area with no IV in place.

## 2011-12-08 NOTE — Anesthesia Postprocedure Evaluation (Signed)
  Anesthesia Post-op Note  Patient: [name]  Procedure(s) Performed: Procedure(s) (LRB) with comments: PARS PLANA VITRECTOMY WITH 25 GAUGE (Left)  Patient Location: PACU  Anesthesia Type:General  Level of Consciousness: awake, oriented and patient cooperative  Airway and Oxygen Therapy: Patient Spontanous Breathing  Post-op Pain: mild  Post-op Assessment: Post-op Vital signs reviewed, Patient's Cardiovascular Status Stable, Respiratory Function Stable, Patent Airway, No signs of Nausea or vomiting and Pain level controlled  Post-op Vital Signs: stable  Complications: No apparent anesthesia complications

## 2011-12-08 NOTE — Anesthesia Procedure Notes (Signed)
Procedure Name: Intubation Date/Time: 12/08/2011 12:35 PM Performed by: Sharlene Dory E Pre-anesthesia Checklist: Patient identified, Emergency Drugs available, Suction available, Patient being monitored and Timeout performed Patient Re-evaluated:Patient Re-evaluated prior to inductionOxygen Delivery Method: Circle system utilized Preoxygenation: Pre-oxygenation with 100% oxygen Intubation Type: IV induction and Rapid sequence Ventilation: Two handed mask ventilation required and Oral airway inserted - appropriate to patient size Laryngoscope Size: Mac and 4 Grade View: Grade III Tube type: Oral Tube size: 7.5 mm Number of attempts: 1 Airway Equipment and Method: Stylet Placement Confirmation: ETT inserted through vocal cords under direct vision,  positive ETCO2 and breath sounds checked- equal and bilateral Secured at: 22 cm Tube secured with: Tape Dental Injury: Teeth and Oropharynx as per pre-operative assessment

## 2011-12-08 NOTE — H&P (Signed)
I examined the patient today and there is no change in the medical status 

## 2011-12-08 NOTE — Anesthesia Preprocedure Evaluation (Addendum)
Anesthesia Evaluation  Patient identified by MRN, date of birth, ID band Patient awake    Reviewed: Allergy & Precautions, H&P , NPO status , Patient's Chart, lab work & pertinent test results  Airway Mallampati: II TM Distance: >3 FB Neck ROM: full    Dental  (+) Teeth Intact   Pulmonary sleep apnea ,          Cardiovascular hypertension, + dysrhythmias Atrial Fibrillation Rhythm:irregular Rate:Normal     Neuro/Psych    GI/Hepatic   Endo/Other  diabetes, Type 2  Renal/GU Renal disease     Musculoskeletal   Abdominal   Peds  Hematology   Anesthesia Other Findings   Reproductive/Obstetrics                          Anesthesia Physical Anesthesia Plan  ASA: III  Anesthesia Plan: General   Post-op Pain Management:    Induction: Intravenous  Airway Management Planned: Oral ETT  Additional Equipment:   Intra-op Plan:   Post-operative Plan: Extubation in OR  Informed Consent: I have reviewed the patients History and Physical, chart, labs and discussed the procedure including the risks, benefits and alternatives for the proposed anesthesia with the patient or authorized representative who has indicated his/her understanding and acceptance.     Plan Discussed with: CRNA, Anesthesiologist and Surgeon  Anesthesia Plan Comments:         Anesthesia Quick Evaluation

## 2011-12-08 NOTE — Preoperative (Signed)
Beta Blockers   Reason not to administer Beta Blockers:Hold  beta blocker due to Bradycardia (HR less than 50 bpm) 

## 2011-12-08 NOTE — Transfer of Care (Signed)
Immediate Anesthesia Transfer of Care Note  Patient: [name]  Procedure(s) Performed: Procedure(s) (LRB) with comments: PARS PLANA VITRECTOMY WITH 25 GAUGE (Left)  Patient Location: PACU  Anesthesia Type:General  Level of Consciousness: awake, alert  and oriented  Airway & Oxygen Therapy: Patient Spontanous Breathing and Patient connected to face mask oxygen  Post-op Assessment: Report given to PACU RN, Post -op Vital signs reviewed and stable and Patient moving all extremities X 4  Post vital signs: Reviewed and stable  Complications: No apparent anesthesia complications

## 2011-12-09 ENCOUNTER — Encounter (HOSPITAL_COMMUNITY): Payer: Self-pay | Admitting: Ophthalmology

## 2011-12-09 MED ORDER — PREDNISOLONE ACETATE 1 % OP SUSP: 1.0000 [drp] | Freq: Four times a day (QID) | OPHTHALMIC | Status: DC

## 2011-12-09 MED ORDER — GATIFLOXACIN 0.5 % OP SOLN: 1.0000 [drp] | Freq: Four times a day (QID) | OPHTHALMIC | Status: DC

## 2011-12-09 MED ORDER — BRIMONIDINE TARTRATE 0.2 % OP SOLN: 1.0000 [drp] | Freq: Two times a day (BID) | OPHTHALMIC | Status: DC

## 2011-12-09 MED ORDER — LATANOPROST 0.005 % OP SOLN: 1.0000 [drp] | Freq: Every day | OPHTHALMIC | Status: DC

## 2011-12-09 MED ORDER — BACITRACIN-POLYMYXIN B 500-10000 UNIT/GM OP OINT: 1.0000 "application " | TOPICAL_OINTMENT | Freq: Four times a day (QID) | OPHTHALMIC | Status: DC

## 2011-12-09 NOTE — Op Note (Signed)
NAMEDARIYON, BARINGER NO.:  [id number]  MEDICAL RECORD NO.:  [id number]  LOCATION:  6N22C                        FACILITY:  [facility]  PHYSICIAN:  Beulah Gandy. Ashley Royalty, M.D. DATE OF BIRTH:  September 23, 1965  DATE OF PROCEDURE:  12/08/2011 DATE OF DISCHARGE:                              OPERATIVE REPORT   ADMISSION DIAGNOSES:  Proliferative diabetic retinopathy, vitreous hemorrhage, posterior membranes in the left eye.  PROCEDURE:  Pars plana vitrectomy, retinal photocoagulation, gas fluid exchange, membrane peel, all in the left eye.  SURGEON:  Beulah Gandy. Ashley Royalty, M.D.  ASSISTANT:  Rosalie Doctor, SA.  ANESTHESIA:  General.  DESCRIPTION OF PROCEDURE:  Usual prepping and draping, a 25-gauge trocar was placed at 10, 2, and 4 o'clock, infusion at 4 o'clock.  Provisc placed on the corneal surface and the biome viewing system moved into place.  Pars plana vitrectomy was begun just behind the cataractous lens.  Blood mixed with vitreous was encountered and the vitrectomy was carried down to the macular surface where posterior membranes were seen. These were engaged with the lighted pick and peeled from their attachments to the macula and the great arcades.  The 25-gauge Pick forceps was used to grasp the vitreous attachment to the disk and pull it free from the disk.  This was then withdrawn from the eye.  The vitrectomy is carried in the mid periphery where surface proliferation was seen and removed with the lighted pick and the cutter.  The vitrectomy was carried in the far periphery with scleral depression. The vitreous base was trimmed for 360 degrees.  The endolaser was positioned in the eye, 1044 burns placed around the retinal periphery. The power was 1000 mW, 1000 microns each, and 0.1 seconds each.  The wide field biome viewing system was moved into place to assist an additional laser of the periphery, and the vitreous cutter was used to remove more vitreous from the  far extreme vitreous base.  Once this was accomplished, the 50% gas fluid exchange was carried out.  The instruments were removed from the eye.  The trocars were removed from the eye.  The wounds were tested and found to be secure.  Polymyxin and gentamicin were irrigated into tenon space.  Atropine solution was applied.  Marcaine was injected around the globe for postop pain. Closing pressure was 10 with a Barraquer tonometer.  Complications none. Duration 1 hour.  The patient was awakened, taken to recovery in satisfactory condition.    Beulah Gandy. Ashley Royalty, M.D.    JDM/MEDQ  D:  12/08/2011  T:  12/09/2011  Job:  [id number]

## 2011-12-09 NOTE — Discharge Summary (Signed)
Discharge summary not needed on [facility] patients per medical records. 

## 2011-12-09 NOTE — Progress Notes (Signed)
12/09/2011, 6:52 AM  Mental Status:  Awake, Alert, Oriented  Anterior segment: Cornea  Clear    Anterior Chamber Clear    Lens:   Cataract  Intra Ocular Pressure 24 mmHg with Tonopen  Vitreous: Clear 30%gas bubble   Retina attached  Impression: Excellent result   Final Diagnosis: Active Problems: Vitreous hemorrhage,  Proliferative diabetic retinopathy   Plan: start post operative eye drops.  Discharge to home.  Give post operative instructions  Sherrie George 12/09/2011, 6:52 AM

## 2011-12-09 NOTE — Progress Notes (Signed)
Discharge instructions reviewed with pt and pt's wife.  Pt and pt's wife verbalized understanding and had no questions.  Eye bag sent home with pt.  Pt discharged in stable condition with wife.  Hector Shade Silas

## 2011-12-15 ENCOUNTER — Inpatient Hospital Stay (INDEPENDENT_AMBULATORY_CARE_PROVIDER_SITE_OTHER): Payer: BC Managed Care – PPO | Admitting: Ophthalmology

## 2011-12-15 DIAGNOSIS — E1165 Type 2 diabetes mellitus with hyperglycemia: Secondary | ICD-10-CM

## 2011-12-15 DIAGNOSIS — E11359 Type 2 diabetes mellitus with proliferative diabetic retinopathy without macular edema: Secondary | ICD-10-CM

## 2012-01-05 ENCOUNTER — Encounter (INDEPENDENT_AMBULATORY_CARE_PROVIDER_SITE_OTHER): Payer: BC Managed Care – PPO | Admitting: Ophthalmology

## 2012-01-05 DIAGNOSIS — E1139 Type 2 diabetes mellitus with other diabetic ophthalmic complication: Secondary | ICD-10-CM

## 2012-01-05 DIAGNOSIS — E11359 Type 2 diabetes mellitus with proliferative diabetic retinopathy without macular edema: Secondary | ICD-10-CM

## 2012-03-19 ENCOUNTER — Encounter (INDEPENDENT_AMBULATORY_CARE_PROVIDER_SITE_OTHER): Payer: BC Managed Care – PPO | Admitting: Ophthalmology

## 2012-03-19 DIAGNOSIS — E1165 Type 2 diabetes mellitus with hyperglycemia: Secondary | ICD-10-CM

## 2012-03-19 DIAGNOSIS — H2510 Age-related nuclear cataract, unspecified eye: Secondary | ICD-10-CM

## 2012-03-19 DIAGNOSIS — I10 Essential (primary) hypertension: Secondary | ICD-10-CM

## 2012-03-19 DIAGNOSIS — H35039 Hypertensive retinopathy, unspecified eye: Secondary | ICD-10-CM

## 2012-03-19 DIAGNOSIS — E11359 Type 2 diabetes mellitus with proliferative diabetic retinopathy without macular edema: Secondary | ICD-10-CM

## 2012-03-19 DIAGNOSIS — H43819 Vitreous degeneration, unspecified eye: Secondary | ICD-10-CM

## 2012-09-17 ENCOUNTER — Ambulatory Visit (INDEPENDENT_AMBULATORY_CARE_PROVIDER_SITE_OTHER): Payer: BC Managed Care – PPO | Admitting: Ophthalmology

## 2012-09-17 DIAGNOSIS — H4310 Vitreous hemorrhage, unspecified eye: Secondary | ICD-10-CM

## 2012-09-17 DIAGNOSIS — E1039 Type 1 diabetes mellitus with other diabetic ophthalmic complication: Secondary | ICD-10-CM

## 2012-09-17 DIAGNOSIS — H43819 Vitreous degeneration, unspecified eye: Secondary | ICD-10-CM

## 2012-09-17 DIAGNOSIS — E11359 Type 2 diabetes mellitus with proliferative diabetic retinopathy without macular edema: Secondary | ICD-10-CM

## 2012-09-17 DIAGNOSIS — H35039 Hypertensive retinopathy, unspecified eye: Secondary | ICD-10-CM

## 2012-09-17 DIAGNOSIS — I10 Essential (primary) hypertension: Secondary | ICD-10-CM

## 2012-11-19 ENCOUNTER — Encounter (INDEPENDENT_AMBULATORY_CARE_PROVIDER_SITE_OTHER): Payer: BC Managed Care – PPO | Admitting: Ophthalmology

## 2012-11-29 ENCOUNTER — Encounter (INDEPENDENT_AMBULATORY_CARE_PROVIDER_SITE_OTHER): Payer: BC Managed Care – PPO | Admitting: Ophthalmology

## 2012-12-17 ENCOUNTER — Encounter (INDEPENDENT_AMBULATORY_CARE_PROVIDER_SITE_OTHER): Payer: BC Managed Care – PPO | Admitting: Ophthalmology

## 2012-12-17 DIAGNOSIS — H43819 Vitreous degeneration, unspecified eye: Secondary | ICD-10-CM

## 2012-12-17 DIAGNOSIS — H35109 Retinopathy of prematurity, unspecified, unspecified eye: Secondary | ICD-10-CM

## 2012-12-17 DIAGNOSIS — H35039 Hypertensive retinopathy, unspecified eye: Secondary | ICD-10-CM

## 2012-12-17 DIAGNOSIS — H3581 Retinal edema: Secondary | ICD-10-CM

## 2012-12-17 DIAGNOSIS — E11359 Type 2 diabetes mellitus with proliferative diabetic retinopathy without macular edema: Secondary | ICD-10-CM

## 2012-12-17 DIAGNOSIS — E1139 Type 2 diabetes mellitus with other diabetic ophthalmic complication: Secondary | ICD-10-CM

## 2012-12-17 DIAGNOSIS — I10 Essential (primary) hypertension: Secondary | ICD-10-CM

## 2013-05-01 ENCOUNTER — Ambulatory Visit (INDEPENDENT_AMBULATORY_CARE_PROVIDER_SITE_OTHER): Payer: BC Managed Care – PPO | Admitting: Ophthalmology

## 2013-05-01 DIAGNOSIS — H43819 Vitreous degeneration, unspecified eye: Secondary | ICD-10-CM

## 2013-05-01 DIAGNOSIS — E1065 Type 1 diabetes mellitus with hyperglycemia: Secondary | ICD-10-CM

## 2013-05-01 DIAGNOSIS — I10 Essential (primary) hypertension: Secondary | ICD-10-CM

## 2013-05-01 DIAGNOSIS — E1039 Type 1 diabetes mellitus with other diabetic ophthalmic complication: Secondary | ICD-10-CM

## 2013-05-01 DIAGNOSIS — H35039 Hypertensive retinopathy, unspecified eye: Secondary | ICD-10-CM

## 2013-05-01 DIAGNOSIS — H2510 Age-related nuclear cataract, unspecified eye: Secondary | ICD-10-CM

## 2013-05-01 DIAGNOSIS — E11359 Type 2 diabetes mellitus with proliferative diabetic retinopathy without macular edema: Secondary | ICD-10-CM

## 2013-11-06 ENCOUNTER — Ambulatory Visit (INDEPENDENT_AMBULATORY_CARE_PROVIDER_SITE_OTHER): Payer: PRIVATE HEALTH INSURANCE | Admitting: Ophthalmology

## 2013-11-06 DIAGNOSIS — E10311 Type 1 diabetes mellitus with unspecified diabetic retinopathy with macular edema: Secondary | ICD-10-CM

## 2013-11-06 DIAGNOSIS — H43811 Vitreous degeneration, right eye: Secondary | ICD-10-CM

## 2013-11-06 DIAGNOSIS — H35033 Hypertensive retinopathy, bilateral: Secondary | ICD-10-CM

## 2013-11-06 DIAGNOSIS — E10351 Type 1 diabetes mellitus with proliferative diabetic retinopathy with macular edema: Secondary | ICD-10-CM

## 2013-11-06 DIAGNOSIS — I10 Essential (primary) hypertension: Secondary | ICD-10-CM

## 2014-03-10 ENCOUNTER — Ambulatory Visit (INDEPENDENT_AMBULATORY_CARE_PROVIDER_SITE_OTHER): Payer: PRIVATE HEALTH INSURANCE | Admitting: Ophthalmology

## 2014-03-10 DIAGNOSIS — E1351 Other specified diabetes mellitus with diabetic peripheral angiopathy without gangrene: Secondary | ICD-10-CM

## 2014-03-10 DIAGNOSIS — E11359 Type 2 diabetes mellitus with proliferative diabetic retinopathy without macular edema: Secondary | ICD-10-CM

## 2014-03-10 DIAGNOSIS — H35033 Hypertensive retinopathy, bilateral: Secondary | ICD-10-CM

## 2014-03-10 DIAGNOSIS — E11311 Type 2 diabetes mellitus with unspecified diabetic retinopathy with macular edema: Secondary | ICD-10-CM

## 2014-03-10 DIAGNOSIS — I10 Essential (primary) hypertension: Secondary | ICD-10-CM

## 2014-03-10 DIAGNOSIS — H43813 Vitreous degeneration, bilateral: Secondary | ICD-10-CM

## 2014-09-08 ENCOUNTER — Ambulatory Visit (INDEPENDENT_AMBULATORY_CARE_PROVIDER_SITE_OTHER): Payer: PRIVATE HEALTH INSURANCE | Admitting: Ophthalmology

## 2014-09-08 DIAGNOSIS — E11311 Type 2 diabetes mellitus with unspecified diabetic retinopathy with macular edema: Secondary | ICD-10-CM | POA: Diagnosis not present

## 2014-09-08 DIAGNOSIS — E11351 Type 2 diabetes mellitus with proliferative diabetic retinopathy with macular edema: Secondary | ICD-10-CM | POA: Diagnosis not present

## 2014-09-08 DIAGNOSIS — I10 Essential (primary) hypertension: Secondary | ICD-10-CM | POA: Diagnosis not present

## 2014-09-08 DIAGNOSIS — H43811 Vitreous degeneration, right eye: Secondary | ICD-10-CM | POA: Diagnosis not present

## 2014-09-08 DIAGNOSIS — H35033 Hypertensive retinopathy, bilateral: Secondary | ICD-10-CM | POA: Diagnosis not present

## 2014-09-08 DIAGNOSIS — E11359 Type 2 diabetes mellitus with proliferative diabetic retinopathy without macular edema: Secondary | ICD-10-CM | POA: Diagnosis not present

## 2014-10-16 ENCOUNTER — Encounter (INDEPENDENT_AMBULATORY_CARE_PROVIDER_SITE_OTHER): Payer: PRIVATE HEALTH INSURANCE | Admitting: Ophthalmology

## 2014-10-16 DIAGNOSIS — E11311 Type 2 diabetes mellitus with unspecified diabetic retinopathy with macular edema: Secondary | ICD-10-CM | POA: Diagnosis not present

## 2014-10-16 DIAGNOSIS — E11359 Type 2 diabetes mellitus with proliferative diabetic retinopathy without macular edema: Secondary | ICD-10-CM

## 2014-10-16 DIAGNOSIS — I10 Essential (primary) hypertension: Secondary | ICD-10-CM

## 2014-10-16 DIAGNOSIS — H35033 Hypertensive retinopathy, bilateral: Secondary | ICD-10-CM | POA: Diagnosis not present

## 2014-10-16 DIAGNOSIS — E11351 Type 2 diabetes mellitus with proliferative diabetic retinopathy with macular edema: Secondary | ICD-10-CM | POA: Diagnosis not present

## 2014-10-16 DIAGNOSIS — H43811 Vitreous degeneration, right eye: Secondary | ICD-10-CM

## 2014-11-20 ENCOUNTER — Encounter (INDEPENDENT_AMBULATORY_CARE_PROVIDER_SITE_OTHER): Payer: PRIVATE HEALTH INSURANCE | Admitting: Ophthalmology

## 2014-11-20 DIAGNOSIS — E11311 Type 2 diabetes mellitus with unspecified diabetic retinopathy with macular edema: Secondary | ICD-10-CM | POA: Diagnosis not present

## 2014-11-20 DIAGNOSIS — I10 Essential (primary) hypertension: Secondary | ICD-10-CM

## 2014-11-20 DIAGNOSIS — E113513 Type 2 diabetes mellitus with proliferative diabetic retinopathy with macular edema, bilateral: Secondary | ICD-10-CM | POA: Diagnosis not present

## 2014-11-20 DIAGNOSIS — H43813 Vitreous degeneration, bilateral: Secondary | ICD-10-CM | POA: Diagnosis not present

## 2014-11-20 DIAGNOSIS — H35033 Hypertensive retinopathy, bilateral: Secondary | ICD-10-CM

## 2014-12-17 ENCOUNTER — Encounter (INDEPENDENT_AMBULATORY_CARE_PROVIDER_SITE_OTHER): Payer: PRIVATE HEALTH INSURANCE | Admitting: Ophthalmology

## 2014-12-17 DIAGNOSIS — E113513 Type 2 diabetes mellitus with proliferative diabetic retinopathy with macular edema, bilateral: Secondary | ICD-10-CM | POA: Diagnosis not present

## 2014-12-17 DIAGNOSIS — H35033 Hypertensive retinopathy, bilateral: Secondary | ICD-10-CM | POA: Diagnosis not present

## 2014-12-17 DIAGNOSIS — H2513 Age-related nuclear cataract, bilateral: Secondary | ICD-10-CM

## 2014-12-17 DIAGNOSIS — H43813 Vitreous degeneration, bilateral: Secondary | ICD-10-CM

## 2014-12-17 DIAGNOSIS — I10 Essential (primary) hypertension: Secondary | ICD-10-CM

## 2014-12-17 DIAGNOSIS — E11311 Type 2 diabetes mellitus with unspecified diabetic retinopathy with macular edema: Secondary | ICD-10-CM

## 2015-01-15 ENCOUNTER — Encounter (INDEPENDENT_AMBULATORY_CARE_PROVIDER_SITE_OTHER): Payer: PRIVATE HEALTH INSURANCE | Admitting: Ophthalmology

## 2015-01-15 DIAGNOSIS — I10 Essential (primary) hypertension: Secondary | ICD-10-CM

## 2015-01-15 DIAGNOSIS — E11311 Type 2 diabetes mellitus with unspecified diabetic retinopathy with macular edema: Secondary | ICD-10-CM

## 2015-01-15 DIAGNOSIS — H35033 Hypertensive retinopathy, bilateral: Secondary | ICD-10-CM

## 2015-01-15 DIAGNOSIS — E113512 Type 2 diabetes mellitus with proliferative diabetic retinopathy with macular edema, left eye: Secondary | ICD-10-CM | POA: Diagnosis not present

## 2015-01-15 DIAGNOSIS — H2513 Age-related nuclear cataract, bilateral: Secondary | ICD-10-CM | POA: Diagnosis not present

## 2015-01-15 DIAGNOSIS — E113591 Type 2 diabetes mellitus with proliferative diabetic retinopathy without macular edema, right eye: Secondary | ICD-10-CM | POA: Diagnosis not present

## 2015-01-15 DIAGNOSIS — H43813 Vitreous degeneration, bilateral: Secondary | ICD-10-CM | POA: Diagnosis not present

## 2015-02-16 ENCOUNTER — Encounter (INDEPENDENT_AMBULATORY_CARE_PROVIDER_SITE_OTHER): Payer: PRIVATE HEALTH INSURANCE | Admitting: Ophthalmology

## 2015-02-16 DIAGNOSIS — I10 Essential (primary) hypertension: Secondary | ICD-10-CM | POA: Diagnosis not present

## 2015-02-16 DIAGNOSIS — H35033 Hypertensive retinopathy, bilateral: Secondary | ICD-10-CM | POA: Diagnosis not present

## 2015-02-16 DIAGNOSIS — H43811 Vitreous degeneration, right eye: Secondary | ICD-10-CM

## 2015-02-16 DIAGNOSIS — E103513 Type 1 diabetes mellitus with proliferative diabetic retinopathy with macular edema, bilateral: Secondary | ICD-10-CM

## 2015-02-16 DIAGNOSIS — E10311 Type 1 diabetes mellitus with unspecified diabetic retinopathy with macular edema: Secondary | ICD-10-CM

## 2015-03-18 ENCOUNTER — Encounter (INDEPENDENT_AMBULATORY_CARE_PROVIDER_SITE_OTHER): Payer: PRIVATE HEALTH INSURANCE | Admitting: Ophthalmology

## 2015-03-18 DIAGNOSIS — E10311 Type 1 diabetes mellitus with unspecified diabetic retinopathy with macular edema: Secondary | ICD-10-CM

## 2015-03-18 DIAGNOSIS — H35033 Hypertensive retinopathy, bilateral: Secondary | ICD-10-CM

## 2015-03-18 DIAGNOSIS — H43812 Vitreous degeneration, left eye: Secondary | ICD-10-CM | POA: Diagnosis not present

## 2015-03-18 DIAGNOSIS — E103513 Type 1 diabetes mellitus with proliferative diabetic retinopathy with macular edema, bilateral: Secondary | ICD-10-CM | POA: Diagnosis not present

## 2015-03-18 DIAGNOSIS — H2513 Age-related nuclear cataract, bilateral: Secondary | ICD-10-CM

## 2015-03-18 DIAGNOSIS — I10 Essential (primary) hypertension: Secondary | ICD-10-CM

## 2015-04-15 ENCOUNTER — Encounter (INDEPENDENT_AMBULATORY_CARE_PROVIDER_SITE_OTHER): Payer: PRIVATE HEALTH INSURANCE | Admitting: Ophthalmology

## 2015-04-15 DIAGNOSIS — H43813 Vitreous degeneration, bilateral: Secondary | ICD-10-CM | POA: Diagnosis not present

## 2015-04-15 DIAGNOSIS — E113513 Type 2 diabetes mellitus with proliferative diabetic retinopathy with macular edema, bilateral: Secondary | ICD-10-CM

## 2015-04-15 DIAGNOSIS — E11311 Type 2 diabetes mellitus with unspecified diabetic retinopathy with macular edema: Secondary | ICD-10-CM | POA: Diagnosis not present

## 2015-04-15 DIAGNOSIS — I10 Essential (primary) hypertension: Secondary | ICD-10-CM | POA: Diagnosis not present

## 2015-04-15 DIAGNOSIS — H35033 Hypertensive retinopathy, bilateral: Secondary | ICD-10-CM

## 2015-05-20 ENCOUNTER — Encounter (INDEPENDENT_AMBULATORY_CARE_PROVIDER_SITE_OTHER): Payer: PRIVATE HEALTH INSURANCE | Admitting: Ophthalmology

## 2015-05-20 DIAGNOSIS — E11311 Type 2 diabetes mellitus with unspecified diabetic retinopathy with macular edema: Secondary | ICD-10-CM | POA: Diagnosis not present

## 2015-05-20 DIAGNOSIS — H43811 Vitreous degeneration, right eye: Secondary | ICD-10-CM

## 2015-05-20 DIAGNOSIS — I10 Essential (primary) hypertension: Secondary | ICD-10-CM | POA: Diagnosis not present

## 2015-05-20 DIAGNOSIS — E113513 Type 2 diabetes mellitus with proliferative diabetic retinopathy with macular edema, bilateral: Secondary | ICD-10-CM | POA: Diagnosis not present

## 2015-05-20 DIAGNOSIS — H35033 Hypertensive retinopathy, bilateral: Secondary | ICD-10-CM | POA: Diagnosis not present

## 2015-06-17 ENCOUNTER — Encounter (INDEPENDENT_AMBULATORY_CARE_PROVIDER_SITE_OTHER): Payer: PRIVATE HEALTH INSURANCE | Admitting: Ophthalmology

## 2015-06-17 DIAGNOSIS — E113513 Type 2 diabetes mellitus with proliferative diabetic retinopathy with macular edema, bilateral: Secondary | ICD-10-CM

## 2015-06-17 DIAGNOSIS — H43813 Vitreous degeneration, bilateral: Secondary | ICD-10-CM | POA: Diagnosis not present

## 2015-06-17 DIAGNOSIS — I10 Essential (primary) hypertension: Secondary | ICD-10-CM

## 2015-06-17 DIAGNOSIS — E11311 Type 2 diabetes mellitus with unspecified diabetic retinopathy with macular edema: Secondary | ICD-10-CM | POA: Diagnosis not present

## 2015-06-17 DIAGNOSIS — H35033 Hypertensive retinopathy, bilateral: Secondary | ICD-10-CM | POA: Diagnosis not present

## 2015-07-15 ENCOUNTER — Encounter (INDEPENDENT_AMBULATORY_CARE_PROVIDER_SITE_OTHER): Payer: PRIVATE HEALTH INSURANCE | Admitting: Ophthalmology

## 2015-07-15 DIAGNOSIS — H1032 Unspecified acute conjunctivitis, left eye: Secondary | ICD-10-CM | POA: Diagnosis not present

## 2015-07-15 DIAGNOSIS — E113512 Type 2 diabetes mellitus with proliferative diabetic retinopathy with macular edema, left eye: Secondary | ICD-10-CM | POA: Diagnosis not present

## 2015-07-15 DIAGNOSIS — H35033 Hypertensive retinopathy, bilateral: Secondary | ICD-10-CM | POA: Diagnosis not present

## 2015-07-15 DIAGNOSIS — E11311 Type 2 diabetes mellitus with unspecified diabetic retinopathy with macular edema: Secondary | ICD-10-CM | POA: Diagnosis not present

## 2015-07-15 DIAGNOSIS — H43813 Vitreous degeneration, bilateral: Secondary | ICD-10-CM | POA: Diagnosis not present

## 2015-07-15 DIAGNOSIS — I10 Essential (primary) hypertension: Secondary | ICD-10-CM | POA: Diagnosis not present

## 2015-07-15 DIAGNOSIS — E113591 Type 2 diabetes mellitus with proliferative diabetic retinopathy without macular edema, right eye: Secondary | ICD-10-CM | POA: Diagnosis not present

## 2015-07-29 ENCOUNTER — Encounter (INDEPENDENT_AMBULATORY_CARE_PROVIDER_SITE_OTHER): Payer: PRIVATE HEALTH INSURANCE | Admitting: Ophthalmology

## 2015-07-29 DIAGNOSIS — E11311 Type 2 diabetes mellitus with unspecified diabetic retinopathy with macular edema: Secondary | ICD-10-CM | POA: Diagnosis not present

## 2015-07-29 DIAGNOSIS — E113513 Type 2 diabetes mellitus with proliferative diabetic retinopathy with macular edema, bilateral: Secondary | ICD-10-CM

## 2015-08-26 ENCOUNTER — Encounter (INDEPENDENT_AMBULATORY_CARE_PROVIDER_SITE_OTHER): Payer: PRIVATE HEALTH INSURANCE | Admitting: Ophthalmology

## 2015-08-26 DIAGNOSIS — E113512 Type 2 diabetes mellitus with proliferative diabetic retinopathy with macular edema, left eye: Secondary | ICD-10-CM

## 2015-08-26 DIAGNOSIS — H43813 Vitreous degeneration, bilateral: Secondary | ICD-10-CM

## 2015-08-26 DIAGNOSIS — H35033 Hypertensive retinopathy, bilateral: Secondary | ICD-10-CM | POA: Diagnosis not present

## 2015-08-26 DIAGNOSIS — E113591 Type 2 diabetes mellitus with proliferative diabetic retinopathy without macular edema, right eye: Secondary | ICD-10-CM | POA: Diagnosis not present

## 2015-08-26 DIAGNOSIS — E11311 Type 2 diabetes mellitus with unspecified diabetic retinopathy with macular edema: Secondary | ICD-10-CM

## 2015-08-26 DIAGNOSIS — I10 Essential (primary) hypertension: Secondary | ICD-10-CM

## 2017-01-18 ENCOUNTER — Other Ambulatory Visit: Payer: Self-pay

## 2017-01-18 ENCOUNTER — Inpatient Hospital Stay (HOSPITAL_COMMUNITY)
Admission: EM | Admit: 2017-01-18 | Discharge: 2017-02-14 | DRG: 870 | Disposition: A | Discharge status: Skilled Nursing Facility | Payer: BC Managed Care – PPO | Attending: Internal Medicine | Admitting: Internal Medicine

## 2017-01-18 ENCOUNTER — Emergency Department (HOSPITAL_COMMUNITY): Payer: BC Managed Care – PPO

## 2017-01-18 ENCOUNTER — Inpatient Hospital Stay (HOSPITAL_COMMUNITY): Payer: BC Managed Care – PPO

## 2017-01-18 ENCOUNTER — Encounter (HOSPITAL_COMMUNITY): Payer: Self-pay

## 2017-01-18 DIAGNOSIS — E1152 Type 2 diabetes mellitus with diabetic peripheral angiopathy with gangrene: Secondary | ICD-10-CM | POA: Diagnosis present

## 2017-01-18 DIAGNOSIS — L89153 Pressure ulcer of sacral region, stage 3: Secondary | ICD-10-CM

## 2017-01-18 DIAGNOSIS — N183 Chronic kidney disease, stage 3 unspecified: Secondary | ICD-10-CM

## 2017-01-18 DIAGNOSIS — N189 Chronic kidney disease, unspecified: Secondary | ICD-10-CM

## 2017-01-18 DIAGNOSIS — J181 Lobar pneumonia, unspecified organism: Secondary | ICD-10-CM | POA: Diagnosis not present

## 2017-01-18 DIAGNOSIS — F4321 Adjustment disorder with depressed mood: Secondary | ICD-10-CM | POA: Diagnosis not present

## 2017-01-18 DIAGNOSIS — E440 Moderate protein-calorie malnutrition: Secondary | ICD-10-CM

## 2017-01-18 DIAGNOSIS — J189 Pneumonia, unspecified organism: Secondary | ICD-10-CM | POA: Diagnosis present

## 2017-01-18 DIAGNOSIS — E11649 Type 2 diabetes mellitus with hypoglycemia without coma: Secondary | ICD-10-CM | POA: Diagnosis present

## 2017-01-18 DIAGNOSIS — J9601 Acute respiratory failure with hypoxia: Secondary | ICD-10-CM

## 2017-01-18 DIAGNOSIS — A419 Sepsis, unspecified organism: Secondary | ICD-10-CM | POA: Diagnosis present

## 2017-01-18 DIAGNOSIS — I4892 Unspecified atrial flutter: Secondary | ICD-10-CM | POA: Diagnosis present

## 2017-01-18 DIAGNOSIS — R339 Retention of urine, unspecified: Secondary | ICD-10-CM | POA: Diagnosis present

## 2017-01-18 DIAGNOSIS — R0600 Dyspnea, unspecified: Secondary | ICD-10-CM

## 2017-01-18 DIAGNOSIS — J690 Pneumonitis due to inhalation of food and vomit: Secondary | ICD-10-CM | POA: Diagnosis not present

## 2017-01-18 DIAGNOSIS — R05 Cough: Secondary | ICD-10-CM

## 2017-01-18 DIAGNOSIS — J9690 Respiratory failure, unspecified, unspecified whether with hypoxia or hypercapnia: Secondary | ICD-10-CM

## 2017-01-18 DIAGNOSIS — J9600 Acute respiratory failure, unspecified whether with hypoxia or hypercapnia: Secondary | ICD-10-CM

## 2017-01-18 DIAGNOSIS — E876 Hypokalemia: Secondary | ICD-10-CM

## 2017-01-18 DIAGNOSIS — E870 Hyperosmolality and hypernatremia: Secondary | ICD-10-CM

## 2017-01-18 DIAGNOSIS — Z515 Encounter for palliative care: Secondary | ICD-10-CM | POA: Diagnosis not present

## 2017-01-18 DIAGNOSIS — N179 Acute kidney failure, unspecified: Secondary | ICD-10-CM | POA: Diagnosis not present

## 2017-01-18 DIAGNOSIS — R0602 Shortness of breath: Secondary | ICD-10-CM

## 2017-01-18 DIAGNOSIS — R059 Cough, unspecified: Secondary | ICD-10-CM

## 2017-01-18 DIAGNOSIS — R6521 Severe sepsis with septic shock: Secondary | ICD-10-CM | POA: Diagnosis present

## 2017-01-18 DIAGNOSIS — J80 Acute respiratory distress syndrome: Secondary | ICD-10-CM | POA: Diagnosis not present

## 2017-01-18 DIAGNOSIS — E1122 Type 2 diabetes mellitus with diabetic chronic kidney disease: Secondary | ICD-10-CM

## 2017-01-18 DIAGNOSIS — L8990 Pressure ulcer of unspecified site, unspecified stage: Secondary | ICD-10-CM

## 2017-01-18 DIAGNOSIS — J9621 Acute and chronic respiratory failure with hypoxia: Secondary | ICD-10-CM | POA: Diagnosis present

## 2017-01-18 DIAGNOSIS — G9341 Metabolic encephalopathy: Secondary | ICD-10-CM

## 2017-01-18 DIAGNOSIS — N170 Acute kidney failure with tubular necrosis: Secondary | ICD-10-CM | POA: Diagnosis not present

## 2017-01-18 DIAGNOSIS — E1111 Type 2 diabetes mellitus with ketoacidosis with coma: Secondary | ICD-10-CM | POA: Diagnosis present

## 2017-01-18 DIAGNOSIS — Z7189 Other specified counseling: Secondary | ICD-10-CM

## 2017-01-18 DIAGNOSIS — I129 Hypertensive chronic kidney disease with stage 1 through stage 4 chronic kidney disease, or unspecified chronic kidney disease: Secondary | ICD-10-CM | POA: Diagnosis present

## 2017-01-18 DIAGNOSIS — R0603 Acute respiratory distress: Secondary | ICD-10-CM | POA: Diagnosis not present

## 2017-01-18 LAB — CBC
HEMATOCRIT: 39 % (ref 39.0–52.0)
Hemoglobin: 13.4 g/dL (ref 13.0–17.0)
MCH: 28.8 pg (ref 26.0–34.0)
MCHC: 34.4 g/dL (ref 30.0–36.0)
MCV: 83.9 fL (ref 78.0–100.0)
PLATELETS: 301 10*3/uL (ref 150–400)
RBC: 4.65 MIL/uL (ref 4.22–5.81)
RDW: 15.2 % (ref 11.5–15.5)
WBC: 8.8 10*3/uL (ref 4.0–10.5)

## 2017-01-18 LAB — URINALYSIS, ROUTINE W REFLEX MICROSCOPIC
Bilirubin Urine: NEGATIVE
KETONES UR: 20 mg/dL — AB
LEUKOCYTES UA: NEGATIVE
NITRITE: NEGATIVE
Protein, ur: 30 mg/dL — AB
Specific Gravity, Urine: 1.02 (ref 1.005–1.030)
pH: 5 (ref 5.0–8.0)

## 2017-01-18 LAB — COMPREHENSIVE METABOLIC PANEL
ALT: 15 U/L — AB (ref 17–63)
AST: 23 U/L (ref 15–41)
Albumin: 2.6 g/dL — ABNORMAL LOW (ref 3.5–5.0)
Alkaline Phosphatase: 96 U/L (ref 38–126)
Anion gap: 27 — ABNORMAL HIGH (ref 5–15)
BUN: 57 mg/dL — ABNORMAL HIGH (ref 6–20)
CALCIUM: 9.1 mg/dL (ref 8.9–10.3)
CHLORIDE: 98 mmol/L — AB (ref 101–111)
CO2: 12 mmol/L — ABNORMAL LOW (ref 22–32)
CREATININE: 1.94 mg/dL — AB (ref 0.61–1.24)
GFR, EST AFRICAN AMERICAN: 44 mL/min — AB (ref >60)
GFR, EST NON AFRICAN AMERICAN: 38 mL/min — AB (ref >60)
Glucose, Bld: 814 mg/dL (ref 65–99)
Potassium: 4.7 mmol/L (ref 3.5–5.1)
Sodium: 137 mmol/L (ref 135–145)
Total Bilirubin: 2.7 mg/dL — ABNORMAL HIGH (ref 0.3–1.2)
Total Protein: 6.7 g/dL (ref 6.5–8.1)

## 2017-01-18 LAB — I-STAT CG4 LACTIC ACID, ED
LACTIC ACID, VENOUS: 1.97 mmol/L — AB (ref 0.5–1.9)
Lactic Acid, Venous: 1.21 mmol/L (ref 0.5–1.9)

## 2017-01-18 LAB — LACTIC ACID, PLASMA
LACTIC ACID, VENOUS: 5.3 mmol/L — AB (ref 0.5–1.9)
LACTIC ACID, VENOUS: 4.8 mmol/L — AB (ref 0.5–1.9)

## 2017-01-18 LAB — RAPID URINE DRUG SCREEN, HOSP PERFORMED
Amphetamines: NOT DETECTED
Barbiturates: NOT DETECTED
Benzodiazepines: NOT DETECTED
COCAINE: NOT DETECTED
OPIATES: NOT DETECTED
TETRAHYDROCANNABINOL: NOT DETECTED

## 2017-01-18 LAB — BLOOD GAS, ARTERIAL
ACID-BASE DEFICIT: 11 mmol/L — AB (ref 0.0–2.0)
Bicarbonate: 15.1 mmol/L — ABNORMAL LOW (ref 20.0–28.0)
Drawn by: 441261
FIO2: 100
LHR: 18 {breaths}/min
O2 SAT: 93.1 %
PEEP/CPAP: 5 cmH2O
PH ART: 7.257 — AB (ref 7.350–7.450)
Patient temperature: 98.6
VT: 570 mL
pCO2 arterial: 35 mmHg (ref 32.0–48.0)
pO2, Arterial: 72.3 mmHg — ABNORMAL LOW (ref 83.0–108.0)

## 2017-01-18 LAB — GLUCOSE, CAPILLARY: Glucose-Capillary: 381 mg/dL — ABNORMAL HIGH (ref 65–99)

## 2017-01-18 LAB — TRIGLYCERIDES
TRIGLYCERIDES: 716 mg/dL — AB (ref <150)
Triglycerides: 608 mg/dL — ABNORMAL HIGH (ref <150)

## 2017-01-18 LAB — CBG MONITORING, ED
Glucose-Capillary: >600 mg/dL (ref 65–99)
Glucose-Capillary: >600 mg/dL (ref 65–99)

## 2017-01-18 LAB — MRSA PCR SCREENING: MRSA by PCR: NEGATIVE

## 2017-01-18 LAB — ETHANOL: Alcohol, Ethyl (B): <10 mg/dL (ref <10)

## 2017-01-18 MED ORDER — SODIUM CHLORIDE 0.9 % IV BOLUS (SEPSIS)
2000.0000 mL | Freq: Once | INTRAVENOUS | Status: AC
  Administered 2017-01-18: 2000 mL via INTRAVENOUS

## 2017-01-18 MED ORDER — SODIUM CHLORIDE 0.9 % IV BOLUS (SEPSIS)
1000.0000 mL | Freq: Once | INTRAVENOUS | Status: AC
  Administered 2017-01-18: 1000 mL via INTRAVENOUS

## 2017-01-18 MED ORDER — NOREPINEPHRINE BITARTRATE 1 MG/ML IV SOLN
0.0000 ug/min | INTRAVENOUS | Status: DC
  Administered 2017-01-18: 25 ug/min via INTRAVENOUS
  Administered 2017-01-18: 40 ug/min via INTRAVENOUS
  Filled 2017-01-18: qty 4

## 2017-01-18 MED ORDER — SODIUM CHLORIDE 0.9 % IV SOLN
0.0300 [IU]/min | INTRAVENOUS | Status: DC
  Administered 2017-01-18: 0.03 [IU]/min via INTRAVENOUS
  Administered 2017-01-19: 0.02 [IU]/min via INTRAVENOUS
  Filled 2017-01-18 (×2): qty 2

## 2017-01-18 MED ORDER — SODIUM CHLORIDE 0.9 % IV SOLN
80.0000 mg | Freq: Once | INTRAVENOUS | Status: AC
  Administered 2017-01-18: 17:00:00 80 mg via INTRAVENOUS
  Filled 2017-01-18: qty 80

## 2017-01-18 MED ORDER — PANTOPRAZOLE SODIUM 40 MG IV SOLR
40.0000 mg | Freq: Two times a day (BID) | INTRAVENOUS | Status: DC
  Administered 2017-01-22: 40 mg via INTRAVENOUS
  Filled 2017-01-18: qty 40

## 2017-01-18 MED ORDER — SODIUM CHLORIDE 0.9 % IV SOLN
INTRAVENOUS | Status: DC
  Administered 2017-01-18: 9.7 [IU]/h via INTRAVENOUS
  Administered 2017-01-18: 10.5 [IU]/h via INTRAVENOUS
  Administered 2017-01-19: 6.4 [IU]/h via INTRAVENOUS
  Filled 2017-01-18 (×4): qty 1

## 2017-01-18 MED ORDER — SODIUM CHLORIDE 0.9 % IV SOLN
INTRAVENOUS | Status: DC
  Administered 2017-01-18: 5.4 [IU]/h via INTRAVENOUS
  Filled 2017-01-18: qty 1

## 2017-01-18 MED ORDER — CHLORHEXIDINE GLUCONATE 0.12% ORAL RINSE (MEDLINE KIT)
15.0000 mL | Freq: Two times a day (BID) | OROMUCOSAL | Status: DC
  Administered 2017-01-18 – 2017-02-13 (×38): 15 mL via OROMUCOSAL

## 2017-01-18 MED ORDER — ORAL CARE MOUTH RINSE
15.0000 mL | Freq: Four times a day (QID) | OROMUCOSAL | Status: DC
  Administered 2017-01-19 – 2017-02-14 (×74): 15 mL via OROMUCOSAL

## 2017-01-18 MED ORDER — SODIUM CHLORIDE 0.9 % IV SOLN
8.0000 mg/h | INTRAVENOUS | Status: DC
  Administered 2017-01-18: 8 mg/h via INTRAVENOUS
  Filled 2017-01-18: qty 80

## 2017-01-18 MED ORDER — PHENYLEPHRINE HCL-NACL 10-0.9 MG/250ML-% IV SOLN
0.0000 ug/min | INTRAVENOUS | Status: DC
  Administered 2017-01-18: 20 ug/min via INTRAVENOUS
  Administered 2017-01-19: 110 ug/min via INTRAVENOUS
  Administered 2017-01-19: 400 mg via INTRAVENOUS
  Filled 2017-01-18 (×2): qty 250

## 2017-01-18 MED ORDER — PIPERACILLIN-TAZOBACTAM 3.375 G IVPB 30 MIN
3.3750 g | Freq: Once | INTRAVENOUS | Status: AC
  Administered 2017-01-18: 3.375 g via INTRAVENOUS
  Filled 2017-01-18: qty 50

## 2017-01-18 MED ORDER — NOREPINEPHRINE BITARTRATE 1 MG/ML IV SOLN
4.0000 ug/min | Freq: Once | INTRAVENOUS | Status: AC
  Administered 2017-01-18: 4 ug/min via INTRAVENOUS
  Filled 2017-01-18: qty 4

## 2017-01-18 MED ORDER — SODIUM CHLORIDE 0.9 % IV SOLN
INTRAVENOUS | Status: DC
  Administered 2017-01-18: 21:00:00 via INTRAVENOUS

## 2017-01-18 MED ORDER — SODIUM CHLORIDE 0.9 % IV SOLN: 250.0000 mL | INTRAVENOUS | Status: DC | PRN

## 2017-01-18 MED ORDER — FENTANYL CITRATE (PF) 100 MCG/2ML IJ SOLN: 100.0000 ug | INTRAMUSCULAR | Status: DC | PRN

## 2017-01-18 MED ORDER — DEXTROSE 5 % IV SOLN
500.0000 mg | INTRAVENOUS | Status: DC
  Administered 2017-01-19 – 2017-01-20 (×2): 500 mg via INTRAVENOUS
  Filled 2017-01-18 (×3): qty 500

## 2017-01-18 MED ORDER — SODIUM CHLORIDE 0.9 % IV BOLUS (SEPSIS)
500.0000 mL | Freq: Once | INTRAVENOUS | Status: AC
  Administered 2017-01-18: 500 mL via INTRAVENOUS

## 2017-01-18 MED ORDER — PIPERACILLIN-TAZOBACTAM 3.375 G IVPB
3.3750 g | Freq: Three times a day (TID) | INTRAVENOUS | Status: AC
  Administered 2017-01-18 – 2017-01-25 (×20): 3.375 g via INTRAVENOUS
  Filled 2017-01-18 (×19): qty 50

## 2017-01-18 MED ORDER — DEXTROSE-NACL 5-0.45 % IV SOLN
INTRAVENOUS | Status: DC
  Administered 2017-01-18: 22:00:00 via INTRAVENOUS

## 2017-01-18 MED ORDER — SODIUM CHLORIDE 0.9 % IV SOLN: INTRAVENOUS | Status: AC

## 2017-01-18 MED ORDER — DEXTROSE 5 % IV SOLN
500.0000 mg | Freq: Once | INTRAVENOUS | Status: AC
  Administered 2017-01-18: 500 mg via INTRAVENOUS
  Filled 2017-01-18: qty 500

## 2017-01-18 MED ORDER — HEPARIN SODIUM (PORCINE) 5000 UNIT/ML IJ SOLN
5000.0000 [IU] | Freq: Three times a day (TID) | INTRAMUSCULAR | Status: DC
  Administered 2017-01-18 – 2017-01-24 (×17): 5000 [IU] via SUBCUTANEOUS
  Filled 2017-01-18 (×17): qty 1

## 2017-01-18 MED ORDER — DEXTROSE 5 % IV SOLN
2.0000 g | Freq: Once | INTRAVENOUS | Status: AC
  Administered 2017-01-18: 2 g via INTRAVENOUS
  Filled 2017-01-18: qty 2

## 2017-01-18 MED ORDER — DEXTROSE-NACL 5-0.45 % IV SOLN: INTRAVENOUS | Status: DC

## 2017-01-18 MED ORDER — PROPOFOL 1000 MG/100ML IV EMUL
5.0000 ug/kg/min | Freq: Once | INTRAVENOUS | Status: AC
  Administered 2017-01-18: 5 ug/kg/min via INTRAVENOUS
  Filled 2017-01-18: qty 100

## 2017-01-18 MED ORDER — NOREPINEPHRINE BITARTRATE 1 MG/ML IV SOLN
0.0000 ug/min | INTRAVENOUS | Status: DC
  Administered 2017-01-18: 40 ug/min via INTRAVENOUS
  Administered 2017-01-19: 30 ug/min via INTRAVENOUS
  Administered 2017-01-19: 40 ug/min via INTRAVENOUS
  Administered 2017-01-19: 50 ug/min via INTRAVENOUS
  Administered 2017-01-20: 24 ug/min via INTRAVENOUS
  Filled 2017-01-18 (×6): qty 16

## 2017-01-18 MED ORDER — PROPOFOL 1000 MG/100ML IV EMUL
0.0000 ug/kg/min | INTRAVENOUS | Status: DC
  Administered 2017-01-18: 15 ug/kg/min via INTRAVENOUS
  Administered 2017-01-18: 5 ug/kg/min via INTRAVENOUS
  Filled 2017-01-18: qty 100

## 2017-01-18 NOTE — ED Notes (Signed)
Bed: WA03 Expected date:  Expected time:  Means of arrival:  Comments: EMS/hypoglycemic/hypotensive

## 2017-01-18 NOTE — Progress Notes (Signed)
eLink Physician-Brief Progress Note Patient Name: Gregory MasonKenneth D Horn DOB: 04/14/1965 MRN: [id number]   Date of Service  01/18/2017  HPI/Events of Note  Saturations improved, BP borderline low. Lactic still elevated but decreasing after bolus.   eICU Interventions  Decrease Peep to 12; Start phenylephrine if needed, follow lactic acid.         Shane Crutchradeep Cornel Werber 01/18/2017, 11:32 PM

## 2017-01-18 NOTE — H&P (Signed)
PULMONARY / CRITICAL CARE MEDICINE   Name: [name] MRN: [id number] DOB: 09/04/1965    ADMISSION DATE:  01/18/2017  REFERRING MD:  Dr Fayrene FearingJames, ED  CHIEF COMPLAINT:  Altered MS, weakness  HISTORY OF PRESENT ILLNESS:   51 year old man with a history of obesity, diabetes, hypertension, hyperlipidemia, obstructive sleep apnea, atrial fib/flutter with a history of prior cardioversion.  He called his son on the date of admission because he was feeling weak and unwell.  He reportedly had not been eating or drinking for the last several days prior to this.  He was brought by EMS to ED and was noted to be hypoglycemic, encephalopathic, hypotensive.  Insulin and IV fluids were initiated, but he remained hypotensive and altered.  Chest x-ray showed a right lower lobe infiltrate and he was started on antibiotics for a community-acquired versus aspiration pneumonia.  He continued to experience an overall decline and was intubated for airway protection in the ED.  Pressors initiated.   PAST MEDICAL HISTORY :  He  has a past medical history of Atrial fib/flutter, transient (09/19/2011), Elevated cholesterol with elevated triglycerides, Hypertension, Kidney stones, Pancreatitis (1999), Pneumonia (~ 2003), Sleep apnea, and Type II diabetes mellitus (HCC).  PAST SURGICAL HISTORY: He  has a past surgical history that includes TEE without cardioversion (09/30/2011); Cardioversion (09/30/2011); Pars plana vitrectomy (12/08/2011); Eye surgery; and Pars plana vitrectomy (12/08/2011).  No Known Allergies  No current facility-administered medications on file prior to encounter.    Current Outpatient Medications on File Prior to Encounter  Medication Sig  . aspirin EC 81 MG tablet Take 81 mg by mouth daily.  Marland Kitchen. atenolol (TENORMIN) 50 MG tablet Take 50-100 mg by mouth 2 (two) times daily. 100 mg in a.m. And 50 mg in p.m.  . bacitracin-polymyxin b (POLYSPORIN) ophthalmic ointment Place 1 application into the left eye  4 (four) times daily. apply to eye every 12 hours while awake  . brimonidine (ALPHAGAN) 0.2 % ophthalmic solution Place 1 drop into the left eye 2 (two) times daily.  . Canagliflozin (INVOKANA) 300 MG TABS Take 1 tablet by mouth daily.  . furosemide (LASIX) 40 MG tablet Take 40 mg by mouth daily.  Marland Kitchen. gatifloxacin (ZYMAXID) 0.5 % SOLN Place 1 drop into the left eye 4 (four) times daily.  Marland Kitchen. gemfibrozil (LOPID) 600 MG tablet Take 600 mg by mouth 2 (two) times daily before a meal.  . insulin aspart protamine-insulin aspart (NOVOLOG 70/30) (70-30) 100 UNIT/ML injection Inject 80 Units into the skin 2 (two) times daily with a meal.  . latanoprost (XALATAN) 0.005 % ophthalmic solution Place 1 drop into the left eye at bedtime.  Marland Kitchen. lisinopril (PRINIVIL,ZESTRIL) 20 MG tablet Take 20 mg by mouth daily.  . metFORMIN (GLUCOPHAGE) 1000 MG tablet Take 1,000 mg by mouth 2 (two) times daily with a meal.  . niacin 500 MG tablet Take 500 mg by mouth 2 (two) times daily with a meal.  . pravastatin (PRAVACHOL) 40 MG tablet Take 40 mg by mouth 2 (two) times daily.  . prednisoLONE acetate (PRED FORTE) 1 % ophthalmic suspension Place 1 drop into the left eye 4 (four) times daily.  . Rivaroxaban (XARELTO) 20 MG TABS Take 1 tablet (20 mg total) by mouth daily.  . vitamin C (ASCORBIC ACID) 500 MG tablet Take 500 mg by mouth daily.    FAMILY HISTORY:  His has no family status information on file.    SOCIAL HISTORY: He  reports that  has never smoked.  he has never used smokeless tobacco. He reports that he drinks alcohol. He reports that he does not use drugs.  REVIEW OF SYSTEMS:   Unable to obtain  SUBJECTIVE:  Unable to obtain  VITAL SIGNS: BP (!) 101/56   Pulse (!) 106   Temp (!) 94.4 F (34.7 C) (Rectal)   Resp 18   Ht 5\' 9"  (1.753 m)   Wt 99.8 kg (220 lb)   SpO2 100%   BMI 32.49 kg/m   HEMODYNAMICS:    VENTILATOR SETTINGS: Vent Mode: PRVC FiO2 (%):  [100 %] 100 % Set Rate:  [18 bmp] 18 bmp Vt  Set:  [500 mL] 500 mL PEEP:  [5 cmH20] 5 cmH20 Plateau Pressure:  [18 cmH20] 18 cmH20  INTAKE / OUTPUT: No intake/output data recorded.  PHYSICAL EXAMINATION: General: Appearing man, intubated, sedated Neuro: Unresponsive, (paralytic still on board) HEENT: ET tube in place, oropharynx dry Cardiovascular: Tachycardic, regular Lungs: Coarse bilaterally, right basilar inspiratory crackles Abdomen: Soft, nontender, hypoactive bowel sounds Musculoskeletal: No deformity Skin: Cool, no lesions, no rashes  LABS:  BMET Recent Labs  Lab 01/18/17 1224  NA 137  K 4.7  CL 98*  CO2 12*  BUN 57*  CREATININE 1.94*  GLUCOSE 814*    Electrolytes Recent Labs  Lab 01/18/17 1224  CALCIUM 9.1    CBC Recent Labs  Lab 01/18/17 1224  WBC 8.8  HGB 13.4  HCT 39.0  PLT 301    Coag's No results for input(s): APTT, INR in the last 168 hours.  Sepsis Markers Recent Labs  Lab 01/18/17 1233 01/18/17 1358  LATICACIDVEN 1.97* 1.21    ABG No results for input(s): PHART, PCO2ART, PO2ART in the last 168 hours.  Liver Enzymes Recent Labs  Lab 01/18/17 1224  AST 23  ALT 15*  ALKPHOS 96  BILITOT 2.7*  ALBUMIN 2.6*    Cardiac Enzymes No results for input(s): TROPONINI, PROBNP in the last 168 hours.  Glucose Recent Labs  Lab 01/18/17 1204 01/18/17 1337 01/18/17 1451 01/18/17 1620  GLUCAP >600* >600* >600* >600*    Imaging Dg Chest Port 1 View  Result Date: 01/18/2017 CLINICAL DATA:  Endotracheal placement. EXAM: PORTABLE CHEST 1 VIEW COMPARISON:  Earlier same day FINDINGS: Endotracheal tip is 3.5 cm above the carina. Nasogastric tube is placed but doubles back upon itself and is present within the distal esophagus. There is worsened infiltrate and collapse in both lower lobes, right worse than left. IMPRESSION: Worsening pneumonia and collapse in the lower lobes right worse than left. Nasogastric tube bent back upon itself, only in the distal esophagus. Endotracheal  tube well positioned. Electronically Signed   By: Paulina FusiMark  Shogry M.D.   On: 01/18/2017 15:59   Dg Chest Port 1 View  Result Date: 01/18/2017 CLINICAL DATA:  Shortness of breath. EXAM: PORTABLE CHEST 1 VIEW COMPARISON:  Radiographs of December 08, 2011. FINDINGS: The heart size and mediastinal contours are within normal limits. Hypoinflation of the lungs is noted. Left lung is clear. Increased right basilar opacity is noted concerning for pneumonia. The visualized skeletal structures are unremarkable. IMPRESSION: Increased right basilar opacity is noted consistent with pneumonia. Followup PA and lateral chest X-ray is recommended in 3-4 weeks following trial of antibiotic therapy to ensure resolution and exclude underlying malignancy. Electronically Signed   By: Lupita RaiderJames  Green Jr, M.D.   On: 01/18/2017 12:53    STUDIES:  CXR 12/19 >>   CULTURES: Blood 12/19 >>   ANTIBIOTICS: Azithromycin 12/19 >>  Ceftriaxone 12/19 >>  Zosyn 12/19 >>   SIGNIFICANT EVENTS:  LINES/TUBES: ETT 12/19 >>  Femoral CVC 12/19 >>   DISCUSSION:  ASSESSMENT / PLAN:  PULMONARY A: Acute respiratory failure Right lower lobe pneumonia, presumed community acquired pneumonia versus aspiration pneumonia P:   PRBC 8cc/kg Assess for SBT once stabilizing Antibiotics as below Pulmonary hygiene  CARDIOVASCULAR A:  Shock, hypovolemic plus possibly septic shock History of atrial fibrillation/flutter on anticoagulation P:  In the setting of DKA/HONC.  Volume resuscitation Follow lactate (normal on initial evaluation) Norepinephrine for goal MAP greater than 65, wean as able  RENAL A:   Acute renal failure, likely due to hypoperfusion Anion gap metabolic acidosis P:   Form resuscitation Follow BMP, urine output  GASTROINTESTINAL A:   Blood-tinged gastric secretions from NG tube post intubation Acid prophylaxis P:   PPI as ordered  HEMATOLOGIC A:   On home anticoagulation for atrial fibrillation DVT  prophylaxis P:  Hold systemic anticoagulation for now given blood-tinged gastric secretions  INFECTIOUS A:   RLL community-acquired pneumonia versus aspiration pneumonia P:   Continue azithromycin and Zosyn for now pending culture data Stop ceftriaxone Blood cultures and sputum cultures  ENDOCRINE A:   Hyperosmolar nonketotic coma/DKA P:   Aggressive IV fluid resuscitation Insulin drip Following frequent BMP for improvement in anion gap, acidosis  NEUROLOGIC A:   Sedation for MV P:   RASS goal: -1 Sedation per PUD protocol   FAMILY  - Updates: No family present in the emergency department  - Inter-disciplinary family meet or Palliative Care meeting due by: 01/25/17  Independent CC time 60 minutes  Levy Pupa, MD, PhD 01/18/2017, 5:36 PM [facility] [phone] or if no answer [phone]

## 2017-01-18 NOTE — Progress Notes (Signed)
Pharmacy communication regarding Levophed infusion:  Due to high rate of infusion, pharmacy changed order for Levophed 4 mg in 250 mL D5W (single strength) to Levophed 16 mg in 250 mL D5W (quadruple strength).  Pharmacy communicated this change to the RN Thea Silversmith(MacKenzie) and communicated need to adjust infusion pump settings for quad strength Levophed.  RN verbalized understanding.  Gregory BollAmanda Samul Horn, PharmD, BCPS Pager: [phone] 01/18/2017 9:59 PM

## 2017-01-18 NOTE — ED Triage Notes (Signed)
Per EMS, pt from home who has not been eating or drinking for several days. Newly incontinent, altered and consistent low pressures. Sats also low (80's on RA). Can answer questions, but is limited. AOx2

## 2017-01-18 NOTE — ED Notes (Signed)
Providers at bedside for patient intubation: Fayrene FearingJames, MD Burr OakLindsey, [place] Aundra MilletMegan, [place] Zella Ballobin, RT Marcelyn DittyWilliam, EMT Logan, RN Tim, RN Toni Amendourtney, RN  15:14 20 mg etomidate administered 15:14 150 mg succinylcholine administered 15:18 NG tube placed 15.21 collar placed 15:22 levophed increased to 8mcg

## 2017-01-18 NOTE — Progress Notes (Signed)
CRITICAL VALUE ALERT  Critical Value:  Lactic 5.3  Date & Time Notied: 01/18/17 1940  Provider Notified: Pola CornElink  Orders Received/Actions taken: bolus

## 2017-01-18 NOTE — ED Notes (Signed)
Date and time results received: 01/18/17 1343   Test: Glucose Critical Value: 814  Name of Provider Notified: Fayrene FearingJames  Orders Received? Or Actions Taken?: Awaiting

## 2017-01-18 NOTE — Progress Notes (Signed)
Pharmacy Antibiotic Note  [name] is a 51 y.o. male admitted on 01/18/2017 with acute respiratory failure requiring intubation, shock, DKA, ARF and PNA.  Pharmacy has been consulted for Zosyn dosing for aspiration pneumonia; MD dosing azithromycin.  Plan: Zosyn 3.375gm IV q8h (4hr extended infusions) No further dosing adjustment needed so Pharmacy will sign off note writing, however, will continue to follow with CCM  Height: 5\' 9"  (175.3 cm) Weight: 220 lb (99.8 kg) IBW/kg (Calculated) : 70.7  Temp (24hrs), Avg:94.4 F (34.7 C), Min:94.4 F (34.7 C), Max:94.4 F (34.7 C)  Recent Labs  Lab 01/18/17 1224 01/18/17 1233 01/18/17 1358  WBC 8.8  --   --   CREATININE 1.94*  --   --   LATICACIDVEN  --  1.97* 1.21    Estimated Creatinine Clearance: 51.3 mL/min (A) (by C-G formula based on SCr of 1.94 mg/dL (H)).    No Known Allergies  Antimicrobials this admission: 12/19 Ceftriaxone x 1 12/19 Azithromycin >> 12/19 Zosyn >>  Dose adjustments this admission: none  Microbiology results: 12/19 BCx:  12/19 UCx:  12/19 Sputum:   Thank you for allowing pharmacy to be a part of this patient's care.  Loralee PacasErin Kenitra Leventhal, PharmD, BCPS Pager: [phone] 01/18/2017 5:41 PM

## 2017-01-18 NOTE — Progress Notes (Signed)
Unable to obtain ABG at this time. Dr. Fayrene FearingJames aware. Holding ABG at this time.

## 2017-01-18 NOTE — Progress Notes (Signed)
eLink Physician-Brief Progress Note Patient Name: Gregory MasonKenneth D Persley DOB: 01/31/1966 MRN: [id number]   Date of Service  01/18/2017  HPI/Events of Note  Pt noted to be hypotensive, severely hypoxic on 100%, required bagging by RT, lactic acid of 5.   eICU Interventions  Bolus 2L, reordered levophed, start vasopressin. Stat CXR, bolus 2L.  Increase Peep to 20, then re-eval.        Shane CrutchPradeep Suetta Hoffmeister 01/18/2017, 7:47 PM

## 2017-01-19 ENCOUNTER — Inpatient Hospital Stay (HOSPITAL_COMMUNITY): Payer: BC Managed Care – PPO

## 2017-01-19 ENCOUNTER — Encounter (HOSPITAL_COMMUNITY): Payer: Self-pay | Admitting: Radiology

## 2017-01-19 DIAGNOSIS — E1101 Type 2 diabetes mellitus with hyperosmolarity with coma: Secondary | ICD-10-CM

## 2017-01-19 DIAGNOSIS — E440 Moderate protein-calorie malnutrition: Secondary | ICD-10-CM

## 2017-01-19 DIAGNOSIS — J80 Acute respiratory distress syndrome: Secondary | ICD-10-CM

## 2017-01-19 LAB — BLOOD CULTURE ID PANEL (REFLEXED)
ACINETOBACTER BAUMANNII: NOT DETECTED
CANDIDA ALBICANS: NOT DETECTED
CANDIDA GLABRATA: NOT DETECTED
Candida krusei: NOT DETECTED
Candida parapsilosis: NOT DETECTED
Candida tropicalis: NOT DETECTED
ENTEROBACTER CLOACAE COMPLEX: NOT DETECTED
ENTEROBACTERIACEAE SPECIES: NOT DETECTED
ENTEROCOCCUS SPECIES: NOT DETECTED
Escherichia coli: NOT DETECTED
Haemophilus influenzae: NOT DETECTED
Klebsiella oxytoca: NOT DETECTED
Klebsiella pneumoniae: NOT DETECTED
LISTERIA MONOCYTOGENES: NOT DETECTED
Methicillin resistance: DETECTED — AB
NEISSERIA MENINGITIDIS: NOT DETECTED
Proteus species: NOT DETECTED
Pseudomonas aeruginosa: NOT DETECTED
STREPTOCOCCUS AGALACTIAE: NOT DETECTED
STREPTOCOCCUS PYOGENES: NOT DETECTED
STREPTOCOCCUS SPECIES: NOT DETECTED
Serratia marcescens: NOT DETECTED
Staphylococcus aureus (BCID): NOT DETECTED
Staphylococcus species: DETECTED — AB
Streptococcus pneumoniae: NOT DETECTED

## 2017-01-19 LAB — GLUCOSE, CAPILLARY
GLUCOSE-CAPILLARY: 116 mg/dL — AB (ref 65–99)
GLUCOSE-CAPILLARY: 125 mg/dL — AB (ref 65–99)
GLUCOSE-CAPILLARY: 136 mg/dL — AB (ref 65–99)
GLUCOSE-CAPILLARY: 145 mg/dL — AB (ref 65–99)
GLUCOSE-CAPILLARY: 148 mg/dL — AB (ref 65–99)
GLUCOSE-CAPILLARY: 151 mg/dL — AB (ref 65–99)
GLUCOSE-CAPILLARY: 155 mg/dL — AB (ref 65–99)
GLUCOSE-CAPILLARY: 161 mg/dL — AB (ref 65–99)
GLUCOSE-CAPILLARY: 175 mg/dL — AB (ref 65–99)
GLUCOSE-CAPILLARY: 202 mg/dL — AB (ref 65–99)
GLUCOSE-CAPILLARY: 206 mg/dL — AB (ref 65–99)
GLUCOSE-CAPILLARY: 210 mg/dL — AB (ref 65–99)
GLUCOSE-CAPILLARY: 241 mg/dL — AB (ref 65–99)
GLUCOSE-CAPILLARY: 322 mg/dL — AB (ref 65–99)
GLUCOSE-CAPILLARY: 97 mg/dL (ref 65–99)
Glucose-Capillary: 131 mg/dL — ABNORMAL HIGH (ref 65–99)
Glucose-Capillary: 139 mg/dL — ABNORMAL HIGH (ref 65–99)
Glucose-Capillary: 144 mg/dL — ABNORMAL HIGH (ref 65–99)
Glucose-Capillary: 150 mg/dL — ABNORMAL HIGH (ref 65–99)
Glucose-Capillary: 161 mg/dL — ABNORMAL HIGH (ref 65–99)
Glucose-Capillary: 164 mg/dL — ABNORMAL HIGH (ref 65–99)
Glucose-Capillary: 177 mg/dL — ABNORMAL HIGH (ref 65–99)
Glucose-Capillary: 186 mg/dL — ABNORMAL HIGH (ref 65–99)
Glucose-Capillary: 190 mg/dL — ABNORMAL HIGH (ref 65–99)
Glucose-Capillary: 196 mg/dL — ABNORMAL HIGH (ref 65–99)
Glucose-Capillary: 205 mg/dL — ABNORMAL HIGH (ref 65–99)
Glucose-Capillary: 209 mg/dL — ABNORMAL HIGH (ref 65–99)
Glucose-Capillary: 253 mg/dL — ABNORMAL HIGH (ref 65–99)

## 2017-01-19 LAB — BLOOD GAS, ARTERIAL
ACID-BASE DEFICIT: 10.7 mmol/L — AB (ref 0.0–2.0)
ACID-BASE DEFICIT: 9.2 mmol/L — AB (ref 0.0–2.0)
Acid-base deficit: 5.6 mmol/L — ABNORMAL HIGH (ref 0.0–2.0)
BICARBONATE: 16.5 mmol/L — AB (ref 20.0–28.0)
BICARBONATE: 18.2 mmol/L — AB (ref 20.0–28.0)
BICARBONATE: 19.4 mmol/L — AB (ref 20.0–28.0)
DRAWN BY: 235321
Drawn by: 422461
Drawn by: 257701
FIO2: 100
FIO2: 60
FIO2: 50
LHR: 16 {breaths}/min
LHR: 30 {breaths}/min
MECHVT: 570 mL
MECHVT: 450 mL
O2 SAT: 99.2 %
O2 Saturation: 93.9 %
O2 Saturation: 96.8 %
PATIENT TEMPERATURE: 41.5
PCO2 ART: 47.7 mmHg (ref 32.0–48.0)
PEEP/CPAP: 12 cmH2O
PEEP/CPAP: 14 cmH2O
PEEP/CPAP: 14 cmH2O
PH ART: 7.174 — AB (ref 7.350–7.450)
PH ART: 7.164 — AB (ref 7.350–7.450)
PO2 ART: 183 mmHg — AB (ref 83.0–108.0)
PO2 ART: 106 mmHg (ref 83.0–108.0)
Patient temperature: 39.9
Patient temperature: 40.4
RATE: 32 resp/min
VT: 430 mL
pCO2 arterial: 49 mmHg — ABNORMAL HIGH (ref 32.0–48.0)
pCO2 arterial: 55.8 mmHg — ABNORMAL HIGH (ref 32.0–48.0)
pH, Arterial: 7.261 — ABNORMAL LOW (ref 7.350–7.450)
pO2, Arterial: 86.8 mmHg (ref 83.0–108.0)

## 2017-01-19 LAB — LACTIC ACID, PLASMA
LACTIC ACID, VENOUS: 3.9 mmol/L — AB (ref 0.5–1.9)
Lactic Acid, Venous: 5 mmol/L (ref 0.5–1.9)
Lactic Acid, Venous: 5.3 mmol/L (ref 0.5–1.9)

## 2017-01-19 LAB — BASIC METABOLIC PANEL
ANION GAP: 8 (ref 5–15)
Anion gap: 9 (ref 5–15)
Anion gap: 9 (ref 5–15)
Anion gap: 9 (ref 5–15)
BUN: 46 mg/dL — ABNORMAL HIGH (ref 6–20)
BUN: 45 mg/dL — ABNORMAL HIGH (ref 6–20)
BUN: 42 mg/dL — AB (ref 6–20)
BUN: 43 mg/dL — AB (ref 6–20)
CALCIUM: 6.3 mg/dL — AB (ref 8.9–10.3)
CHLORIDE: 115 mmol/L — AB (ref 101–111)
CHLORIDE: 111 mmol/L (ref 101–111)
CHLORIDE: 111 mmol/L (ref 101–111)
CO2: 21 mmol/L — ABNORMAL LOW (ref 22–32)
CO2: 20 mmol/L — AB (ref 22–32)
CO2: 20 mmol/L — ABNORMAL LOW (ref 22–32)
CO2: 21 mmol/L — ABNORMAL LOW (ref 22–32)
Calcium: 7 mg/dL — ABNORMAL LOW (ref 8.9–10.3)
Calcium: 6.2 mg/dL — CL (ref 8.9–10.3)
Calcium: 5.9 mg/dL — CL (ref 8.9–10.3)
Chloride: 116 mmol/L — ABNORMAL HIGH (ref 101–111)
Creatinine, Ser: 1.95 mg/dL — ABNORMAL HIGH (ref 0.61–1.24)
Creatinine, Ser: 2.15 mg/dL — ABNORMAL HIGH (ref 0.61–1.24)
Creatinine, Ser: 2.06 mg/dL — ABNORMAL HIGH (ref 0.61–1.24)
Creatinine, Ser: 2.03 mg/dL — ABNORMAL HIGH (ref 0.61–1.24)
GFR calc Af Amer: 41 mL/min — ABNORMAL LOW (ref >60)
GFR calc Af Amer: 41 mL/min — ABNORMAL LOW (ref >60)
GFR calc non Af Amer: 37 mL/min — ABNORMAL LOW (ref >60)
GFR calc non Af Amer: 33 mL/min — ABNORMAL LOW (ref >60)
GFR calc non Af Amer: 36 mL/min — ABNORMAL LOW (ref >60)
GFR, EST AFRICAN AMERICAN: 43 mL/min — AB (ref >60)
GFR, EST AFRICAN AMERICAN: 39 mL/min — AB (ref >60)
GFR, EST NON AFRICAN AMERICAN: 35 mL/min — AB (ref >60)
GLUCOSE: 172 mg/dL — AB (ref 65–99)
GLUCOSE: 222 mg/dL — AB (ref 65–99)
GLUCOSE: 150 mg/dL — AB (ref 65–99)
Glucose, Bld: 228 mg/dL — ABNORMAL HIGH (ref 65–99)
POTASSIUM: 3.5 mmol/L (ref 3.5–5.1)
POTASSIUM: 3.7 mmol/L (ref 3.5–5.1)
POTASSIUM: 3.6 mmol/L (ref 3.5–5.1)
POTASSIUM: 3.2 mmol/L — AB (ref 3.5–5.1)
SODIUM: 145 mmol/L (ref 135–145)
Sodium: 144 mmol/L (ref 135–145)
Sodium: 140 mmol/L (ref 135–145)
Sodium: 141 mmol/L (ref 135–145)

## 2017-01-19 LAB — LIPASE, BLOOD: Lipase: 297 U/L — ABNORMAL HIGH (ref 11–51)

## 2017-01-19 LAB — CBC
HEMATOCRIT: 34.9 % — AB (ref 39.0–52.0)
Hemoglobin: 12 g/dL — ABNORMAL LOW (ref 13.0–17.0)
MCH: 28.4 pg (ref 26.0–34.0)
MCHC: 34.4 g/dL (ref 30.0–36.0)
MCV: 82.5 fL (ref 78.0–100.0)
PLATELETS: 255 10*3/uL (ref 150–400)
RBC: 4.23 MIL/uL (ref 4.22–5.81)
RDW: 15.4 % (ref 11.5–15.5)
WBC: 3 10*3/uL — ABNORMAL LOW (ref 4.0–10.5)

## 2017-01-19 LAB — MAGNESIUM: MAGNESIUM: 1.6 mg/dL — AB (ref 1.7–2.4)

## 2017-01-19 LAB — AMYLASE: AMYLASE: 411 U/L — AB (ref 28–100)

## 2017-01-19 LAB — HIV ANTIBODY (ROUTINE TESTING W REFLEX): HIV SCREEN 4TH GENERATION: NONREACTIVE

## 2017-01-19 LAB — PHOSPHORUS: Phosphorus: 1.2 mg/dL — ABNORMAL LOW (ref 2.5–4.6)

## 2017-01-19 MED ORDER — SODIUM BICARBONATE 8.4 % IV SOLN
INTRAVENOUS | Status: DC
  Administered 2017-01-19 (×3): via INTRAVENOUS
  Filled 2017-01-19 (×3): qty 150

## 2017-01-19 MED ORDER — IOPAMIDOL (ISOVUE-300) INJECTION 61%
INTRAVENOUS | Status: AC
Start: 2017-01-19 — End: 2017-01-19
  Administered 2017-01-19: 15 mL
  Filled 2017-01-19: qty 30

## 2017-01-19 MED ORDER — VANCOMYCIN HCL IN DEXTROSE 1-5 GM/200ML-% IV SOLN
1000.0000 mg | INTRAVENOUS | Status: DC
  Filled 2017-01-19: qty 200

## 2017-01-19 MED ORDER — CISATRACURIUM BOLUS VIA INFUSION
0.1000 mg/kg | Freq: Once | INTRAVENOUS | Status: DC
  Filled 2017-01-19: qty 10

## 2017-01-19 MED ORDER — MIDAZOLAM HCL 2 MG/2ML IJ SOLN: 2.0000 mg | Freq: Once | INTRAMUSCULAR | Status: DC | PRN

## 2017-01-19 MED ORDER — POTASSIUM CHLORIDE 20 MEQ/15ML (10%) PO SOLN
40.0000 meq | Freq: Once | ORAL | Status: AC
  Administered 2017-01-19: 40 meq
  Filled 2017-01-19: qty 30

## 2017-01-19 MED ORDER — SODIUM CHLORIDE 0.9 % IV SOLN
2.0000 mg/h | INTRAVENOUS | Status: DC
  Administered 2017-01-19: 4 mg/h via INTRAVENOUS
  Administered 2017-01-19: 1 mg/h via INTRAVENOUS
  Administered 2017-01-20: 2 mg/h via INTRAVENOUS
  Administered 2017-01-20: 3 mg/h via INTRAVENOUS
  Filled 2017-01-19 (×4): qty 10

## 2017-01-19 MED ORDER — NOREPINEPHRINE BITARTRATE 1 MG/ML IV SOLN: 0.0000 ug/min | INTRAVENOUS | Status: DC

## 2017-01-19 MED ORDER — SODIUM BICARBONATE 8.4 % IV SOLN
100.0000 meq | Freq: Once | INTRAVENOUS | Status: AC
  Administered 2017-01-19: 100 meq via INTRAVENOUS
  Filled 2017-01-19: qty 50

## 2017-01-19 MED ORDER — ARTIFICIAL TEARS OPHTHALMIC OINT
1.0000 "application " | TOPICAL_OINTMENT | Freq: Three times a day (TID) | OPHTHALMIC | Status: DC
  Administered 2017-01-19 – 2017-01-22 (×6): 1 via OPHTHALMIC
  Filled 2017-01-19: qty 3.5

## 2017-01-19 MED ORDER — PHENYLEPHRINE HCL-NACL 10-0.9 MG/250ML-% IV SOLN
INTRAVENOUS | Status: AC
  Administered 2017-01-19: 400 mg via INTRAVENOUS
  Filled 2017-01-19: qty 250

## 2017-01-19 MED ORDER — INSULIN GLARGINE 100 UNIT/ML ~~LOC~~ SOLN
20.0000 [IU] | Freq: Every day | SUBCUTANEOUS | Status: DC
  Administered 2017-01-19: 20 [IU] via SUBCUTANEOUS
  Filled 2017-01-19: qty 0.2

## 2017-01-19 MED ORDER — MIDAZOLAM BOLUS VIA INFUSION
2.0000 mg | INTRAVENOUS | Status: DC | PRN
  Filled 2017-01-19: qty 2

## 2017-01-19 MED ORDER — INSULIN ASPART 100 UNIT/ML ~~LOC~~ SOLN
3.0000 [IU] | SUBCUTANEOUS | Status: DC
  Administered 2017-01-20 (×2): 3 [IU] via SUBCUTANEOUS

## 2017-01-19 MED ORDER — PHENYLEPHRINE HCL 10 MG/ML IJ SOLN
0.0000 ug/min | INTRAMUSCULAR | Status: DC
  Administered 2017-01-19: 300 ug/min via INTRAVENOUS
  Administered 2017-01-19: 200 ug/min via INTRAVENOUS
  Administered 2017-01-19: 100 ug/min via INTRAVENOUS
  Administered 2017-01-19 (×2): 250 ug/min via INTRAVENOUS
  Filled 2017-01-19: qty 40
  Filled 2017-01-19 (×4): qty 4
  Filled 2017-01-19: qty 40
  Filled 2017-01-19 (×2): qty 4

## 2017-01-19 MED ORDER — SODIUM CHLORIDE 0.9 % IV SOLN
100.0000 ug/h | INTRAVENOUS | Status: DC
  Administered 2017-01-19: 100 ug/h via INTRAVENOUS
  Administered 2017-01-19 – 2017-01-21 (×2): 150 ug/h via INTRAVENOUS
  Filled 2017-01-19 (×4): qty 50

## 2017-01-19 MED ORDER — SODIUM CHLORIDE 0.9% FLUSH
10.0000 mL | INTRAVENOUS | Status: DC | PRN
  Administered 2017-01-19: 40 mL
  Filled 2017-01-19: qty 40

## 2017-01-19 MED ORDER — FAMOTIDINE IN NACL 20-0.9 MG/50ML-% IV SOLN: 20.0000 mg | Freq: Two times a day (BID) | INTRAVENOUS | Status: DC

## 2017-01-19 MED ORDER — DEXTROSE-NACL 5-0.45 % IV SOLN
INTRAVENOUS | Status: DC
  Administered 2017-01-19: via INTRAVENOUS

## 2017-01-19 MED ORDER — VANCOMYCIN HCL 10 G IV SOLR
1500.0000 mg | Freq: Once | INTRAVENOUS | Status: AC
  Administered 2017-01-19: 1500 mg via INTRAVENOUS
  Filled 2017-01-19: qty 1500

## 2017-01-19 MED ORDER — MIDAZOLAM HCL 2 MG/2ML IJ SOLN: 2.0000 mg | Freq: Once | INTRAMUSCULAR | Status: DC

## 2017-01-19 MED ORDER — IOPAMIDOL (ISOVUE-300) INJECTION 61%: 15.0000 mL | Freq: Two times a day (BID) | INTRAVENOUS | Status: DC | PRN

## 2017-01-19 MED ORDER — POTASSIUM PHOSPHATES 15 MMOLE/5ML IV SOLN
20.0000 meq | Freq: Once | INTRAVENOUS | Status: AC
  Administered 2017-01-19: 20 meq via INTRAVENOUS
  Filled 2017-01-19: qty 4.55

## 2017-01-19 MED ORDER — FENTANYL CITRATE (PF) 100 MCG/2ML IJ SOLN: 100.0000 ug | Freq: Once | INTRAMUSCULAR | Status: DC | PRN

## 2017-01-19 MED ORDER — ACETAMINOPHEN 160 MG/5ML PO SOLN
650.0000 mg | Freq: Three times a day (TID) | ORAL | Status: DC | PRN
  Administered 2017-01-19 – 2017-01-25 (×3): 650 mg
  Filled 2017-01-19 (×3): qty 20.3

## 2017-01-19 MED ORDER — SODIUM CHLORIDE 0.9 % IV SOLN
3.0000 ug/kg/min | INTRAVENOUS | Status: DC
  Administered 2017-01-19: 1 ug/kg/min via INTRAVENOUS
  Administered 2017-01-19: 4 ug/kg/min via INTRAVENOUS
  Administered 2017-01-19: 5 ug/kg/min via INTRAVENOUS
  Administered 2017-01-20: 3 ug/kg/min via INTRAVENOUS
  Filled 2017-01-19 (×4): qty 20

## 2017-01-19 MED ORDER — FENTANYL BOLUS VIA INFUSION
50.0000 ug | INTRAVENOUS | Status: DC | PRN
  Filled 2017-01-19: qty 50

## 2017-01-19 MED ORDER — INSULIN ASPART 100 UNIT/ML ~~LOC~~ SOLN
2.0000 [IU] | SUBCUTANEOUS | Status: DC
  Administered 2017-01-20 (×2): 6 [IU] via SUBCUTANEOUS

## 2017-01-19 MED ORDER — FLUDROCORTISONE ACETATE 0.1 MG PO TABS
0.0500 mg | ORAL_TABLET | Freq: Every day | ORAL | Status: DC
  Administered 2017-01-19 – 2017-01-22 (×4): 0.05 mg via ORAL
  Filled 2017-01-19 (×5): qty 0.5

## 2017-01-19 MED ORDER — FENTANYL CITRATE (PF) 100 MCG/2ML IJ SOLN: 100.0000 ug | Freq: Once | INTRAMUSCULAR | Status: DC

## 2017-01-19 MED ORDER — MAGNESIUM SULFATE 2 GM/50ML IV SOLN
2.0000 g | Freq: Once | INTRAVENOUS | Status: AC
  Administered 2017-01-19: 2 g via INTRAVENOUS
  Filled 2017-01-19: qty 50

## 2017-01-19 MED ORDER — DEXTROSE 10 % IV SOLN
INTRAVENOUS | Status: DC | PRN
  Filled 2017-01-19: qty 1000

## 2017-01-19 MED ORDER — SODIUM CHLORIDE 0.9 % IV SOLN: INTRAVENOUS | Status: DC | PRN

## 2017-01-19 MED ORDER — SODIUM CHLORIDE 0.9% FLUSH
10.0000 mL | Freq: Two times a day (BID) | INTRAVENOUS | Status: DC
  Administered 2017-01-19 – 2017-01-20 (×2): 10 mL
  Administered 2017-01-20: 30 mL
  Administered 2017-01-21 – 2017-01-24 (×4): 10 mL

## 2017-01-19 MED ORDER — POTASSIUM CHLORIDE 10 MEQ/50ML IV SOLN
10.0000 meq | INTRAVENOUS | Status: AC
  Administered 2017-01-19 – 2017-01-20 (×2): 10 meq via INTRAVENOUS
  Filled 2017-01-19 (×2): qty 50

## 2017-01-19 MED ORDER — HYDROCORTISONE NA SUCCINATE PF 100 MG IJ SOLR
50.0000 mg | Freq: Four times a day (QID) | INTRAMUSCULAR | Status: DC
  Administered 2017-01-19 – 2017-01-23 (×17): 50 mg via INTRAVENOUS
  Filled 2017-01-19 (×17): qty 2

## 2017-01-19 MED ORDER — CHLORHEXIDINE GLUCONATE CLOTH 2 % EX PADS
6.0000 | MEDICATED_PAD | Freq: Every day | CUTANEOUS | Status: DC
  Administered 2017-01-19: 6 via TOPICAL

## 2017-01-19 NOTE — Progress Notes (Signed)
eLink Physician-Brief Progress Note Patient Name: [name] DOB: 03/24/1965 MRN: [id number]   Date of Service  01/19/2017  HPI/Events of Note    eICU Interventions  Replace K and transition off insulin gtt.        Damya Comley 01/19/2017, 11:06 PM

## 2017-01-19 NOTE — Procedures (Signed)
Arterial Catheter Insertion Procedure Note Manley MasonKenneth D Luton [id number] 02/05/1965  Procedure: Insertion of Arterial Catheter  Indications: Blood pressure monitoring and Frequent blood sampling  Procedure Details Consent: Unable to obtain consent because of emergent medical necessity. Time Out: Verified patient identification, verified procedure, site/side was marked, verified correct patient position, special equipment/implants available, medications/allergies/relevent history reviewed, required imaging and test results available.  Performed  Maximum sterile technique was used including antiseptics, cap, gloves, gown, hand hygiene, mask and sheet. Skin prep: Chlorhexidine; local anesthetic administered 22 gauge catheter was inserted into left radial artery using the Seldinger technique.  Evaluation Blood flow good; BP tracing good. Complications: No apparent complications.   Rulon EisenmengerJones, Skyllar Notarianni K 01/19/2017

## 2017-01-19 NOTE — Progress Notes (Addendum)
Inpatient Diabetes Program Recommendations  AACE/ADA: New Consensus Statement on Inpatient Glycemic Control (2015)  Target Ranges:  Prepandial:   less than 140 mg/dL      Peak postprandial:   less than 180 mg/dL (1-2 hours)      Critically ill patients:  140 - 180 mg/dL   Lab Results  Component Value Date   GLUCAP 190 (H) 01/19/2017    Review of Glycemic Control  Diabetes history: DM2 Outpatient Diabetes medications: metformin 850 mg QD, 70/30 80 units bid, Invokana 300 mg QD Current orders for Inpatient glycemic control: IV insulin per DKA order set  No HgbA1C results available.   Inpatient Diabetes Program Recommendations:    Continue with insulin drip until criteria met for discontinuation. Need HgbA1C to assess glycemic control PTA.  Continue to follow.  Thank you. Lorenda Peck, RD, LDN, CDE Inpatient Diabetes Coordinator [phone]

## 2017-01-19 NOTE — Progress Notes (Signed)
Patient was transported to CT without any issues. Patient tolerated well.

## 2017-01-19 NOTE — Care Management Note (Signed)
Case Management Note  Patient Details  Name: [name] MRN: [id number] Date of Birth: 07/10/1965  Subjective/Objective:                  dka and ams  Action/Plan: Date: January 19, 2017 [name], BSN, [place]3, [place] [phone] Chart and notes review for patient progress and needs. Will follow for case management and discharge needs. Next review date: [id number]  Expected Discharge Date:  (unknown)               Expected Discharge Plan:  Home/Self Care  In-House Referral:     Discharge planning Services  CM Consult  Post Acute Care Choice:    Choice offered to:     DME Arranged:    DME Agency:     HH Arranged:    HH Agency:     Status of Service:  In process, will continue to follow  If discussed at Long Length of Stay Meetings, dates discussed:    Additional Comments:  Golda AcreDavis, Rhonda Lynn, RN 01/19/2017, 7:31 AM

## 2017-01-19 NOTE — Progress Notes (Signed)
CRITICAL VALUE ALERT  Critical Value:  Lactic acid 4.8  Date & Time Notied: 01/18/17 2200  Provider Notified: Elink  Orders Received/Actions taken: continue re-draws

## 2017-01-19 NOTE — Progress Notes (Signed)
Initial Nutrition Assessment  DOCUMENTATION CODES:   Non-severe (moderate) malnutrition in context of chronic illness  INTERVENTION:  - Will monitor pressor support and appropriateness to initiate TF prior to making recommendations.  NUTRITION DIAGNOSIS:   Moderate Malnutrition related to chronic illness(DM) as evidenced by moderate muscle depletion, mild muscle depletion, mild fat depletion.  GOAL:   Patient will meet greater than or equal to 90% of their needs  MONITOR:   Vent status, Weight trends, Labs, Other (Comment)(Plans concerning nutrition support)  REASON FOR ASSESSMENT:   Ventilator, Low Braden  ASSESSMENT:   51 year old man with a history of obesity, diabetes, hypertension, hyperlipidemia, obstructive sleep apnea, atrial fib/flutter with a history of prior cardioversion.  He called his son on the date of admission because he was feeling weak and unwell.  He reportedly had not been eating or drinking for the last several days prior to this.  He was brought by EMS to ED and was noted to be hypoglycemic, encephalopathic, hypotensive.  Insulin and IV fluids were initiated, but he remained hypotensive and altered.  Chest x-ray showed a right lower lobe infiltrate and he was started on antibiotics for a community-acquired versus aspiration pneumonia.  He continued to experience an overall decline and was intubated for airway protection in the ED.   BMI indicates overweight status. NGT to LIS with 200cc dark output at this time; RN reports this is mainly from night shift. Wife is at bedside and reports all PTA information. She states that pt has been feeling unwell x1 month and kept promising family and friends to go to a doctor, but he never did. She states that he is home during the day while she works and that "I can't watch him and control what he does and doesn't eat; he eats terribly." She indicates this means unhealthy food choices. It is unclear from discussion with pt's  wife if there were any changes in eating habits or the quantity of food he was eating during the time of not feeling well. She reports that pt weighed 260 lbs at a doctor's office ~1 year ago. Based on current weight, this indicates 70 lb weight loss (27% body weight) in the past ~1 year which is significant for time frame. No weight in the chart since 12/08/11 when pt weighed 305 lbs.   Patient is currently intubated on ventilator support MV: 14.2 L/min Temp (24hrs), Avg:101.5 F (38.6 C), Min:94.4 F (34.7 C), Max:105.6 F (40.9 C) Propofol: none BP: 105/59 and MAP: 73  Medications reviewed; 50 mg Solu-cortef QID, 2 g IV Mg sulfate x1 run today, 40 mEq KCl per NGT x1 dose today, 100 mEq sodium bicarb x1 dose today. Labs reviewed; CBGs: 97-210 mg/dL since midnight, Cl: 782115 mmol/L, BUN: 46 mg/dL, creatinine: 9.561.95 mg/dL, Ca: 7 mg/dL, GFR: 37 mL/min.   IVF: D5-150 mEq sodium bicarb @ 125 mL/hr (510 kcal) Drips: Nimbex @ 5 mcg/kg/min, insulin @ 6.4 units/hr, Fentanyl @ 125 mcg/hr, Neo @ 250 mcg/hr, Vaso @ 0.03 units/min, Levo @ 40 mcg/min, Versed @ 4 mg/hr.       NUTRITION - FOCUSED PHYSICAL EXAM:    Most Recent Value  Orbital Region  No depletion  Upper Arm Region  No depletion  Thoracic and Lumbar Region  No depletion  Buccal Region  Mild depletion  Temple Region  Unable to assess  Clavicle Bone Region  Mild depletion  Clavicle and Acromion Bone Region  Mild depletion  Scapular Bone Region  Unable to assess  Dorsal  Hand  No depletion  Patellar Region  Mild depletion  Anterior Thigh Region  Moderate depletion  Posterior Calf Region  Moderate depletion  Edema (RD Assessment)  None  Hair  Reviewed  Eyes  Unable to assess  Mouth  Unable to assess  Skin  Reviewed  Nails  Reviewed       Diet Order:  Diet NPO time specified  EDUCATION NEEDS:   No education needs have been identified at this time  Skin:  Skin Assessment: Reviewed RN Assessment  Last BM:   PTA/unknown  Height:   Ht Readings from Last 1 Encounters:  01/18/17 5\' 9"  (1.753 m)    Weight:   Wt Readings from Last 1 Encounters:  01/19/17 190 lb 7.6 oz (86.4 kg)    Ideal Body Weight:  72.73 kg  BMI:  Body mass index is 28.13 kg/m.  Estimated Nutritional Needs:   Kcal:  2693  Protein:  >/= 130 grams (1.5 grams/kg)  Fluid:  >/= 2.2 L/day     Gregory GammonJessica Angelica Wix, MS, RD, LDN, [facility] Inpatient Clinical Dietitian Pager # [phone] After hours/weekend pager # [phone]

## 2017-01-19 NOTE — Progress Notes (Signed)
PULMONARY / CRITICAL CARE MEDICINE   Name: [name] MRN: [id number] DOB: 07/18/1965    ADMISSION DATE:  01/18/2017  REFERRING MD:  Dr Fayrene Fearing, ED  CHIEF COMPLAINT:  Altered MS, weakness  SUMMARY:   51 year old man with a history of obesity, diabetes, hypertension, hyperlipidemia, obstructive sleep apnea, atrial fib/flutter with a history of prior cardioversion.  He called his son on the date of admission because he was feeling weak and unwell.  He reportedly had not been eating or drinking for the last several days prior to this.  He was brought by EMS to ED and was noted to be hypoglycemic, encephalopathic, hypotensive.  Insulin and IV fluids were initiated, but he remained hypotensive and altered.  Chest x-ray showed a right lower lobe infiltrate and he was started on antibiotics for a community-acquired versus aspiration pneumonia.  He continued to experience an overall decline and was intubated for airway protection in the ED.  Pressors initiated. The patient had progressive hypoxia with a PF ratio <200 concerning for ARDS requiring paralytics / low volume ventilation.     SUBJECTIVE:  RN reports pt febrile to ~ 106, pt remains on paralytics.  Multiple gtt's (nimbex, insulin, neo, versed, levo, vaso).     VITAL SIGNS: BP (!) 105/59   Pulse (!) 118   Temp (!) 105.6 F (40.9 C)   Resp (!) 32   Ht 5\' 9"  (1.753 m)   Wt 190 lb 7.6 oz (86.4 kg)   SpO2 97%   BMI 28.13 kg/m   HEMODYNAMICS:    VENTILATOR SETTINGS: Vent Mode: PRVC FiO2 (%):  [50 %-100 %] 50 % Set Rate:  [16 bmp-30 bmp] 30 bmp Vt Set:  [430 mL-570 mL] 430 mL PEEP:  [5 cmH20-20 cmH20] 14 cmH20 Plateau Pressure:  [18 cmH20-39 cmH20] 25 cmH20  INTAKE / OUTPUT: I/O last 3 completed shifts: In: 4771.1 [I.V.:1471.1; IV Piggyback:3300] Out: 1200 [Urine:1000; Emesis/NG output:200]  PHYSICAL EXAMINATION: General: critically ill appearing male on vent  HEENT: MM pink/moist, ETT, BIS monitor in place  Neuro:  sedated / paralyzed on vent  CV: s1s2 rrr, no m/r/g, trigeminy  PULM: even/non-labored, lungs bilaterally coarse WU:JWJX, non-tender, bsx4 active  Extremities: warm/dry, no edema  Skin: no rashes or lesions, open area on R heel /dry (? Callous with fissure)  LABS:  BMET Recent Labs  Lab 01/18/17 1224 01/19/17 0730  NA 137 145  K 4.7 3.5  CL 98* 115*  CO2 12* 21*  BUN 57* 46*  CREATININE 1.94* 1.95*  GLUCOSE 814* 228*    Electrolytes Recent Labs  Lab 01/18/17 1224 01/19/17 0232 01/19/17 0730  CALCIUM 9.1  --  7.0*  MG  --  1.6*  --   PHOS  --  1.2*  --     CBC Recent Labs  Lab 01/18/17 1224 01/19/17 0232  WBC 8.8 3.0*  HGB 13.4 12.0*  HCT 39.0 34.9*  PLT 301 255    Coag's No results for input(s): APTT, INR in the last 168 hours.  Sepsis Markers Recent Labs  Lab 01/18/17 2025 01/18/17 2330 01/19/17 0232  LATICACIDVEN 4.8* 5.3* 5.0*    ABG Recent Labs  Lab 01/18/17 1703 01/19/17 0141 01/19/17 0424  PHART 7.257* 7.174* 7.164*  PCO2ART 35.0 49.0* 55.8*  PO2ART 72.3* 183* 86.8    Liver Enzymes Recent Labs  Lab 01/18/17 1224  AST 23  ALT 15*  ALKPHOS 96  BILITOT 2.7*  ALBUMIN 2.6*    Cardiac Enzymes No results for input(s):  TROPONINI, PROBNP in the last 168 hours.  Glucose Recent Labs  Lab 01/19/17 0523 01/19/17 0623 01/19/17 0735 01/19/17 0856 01/19/17 1004 01/19/17 1048  GLUCAP 116* 196* 202* 206* 150* 190*    Imaging Dg Chest 1 View  Result Date: 01/18/2017 CLINICAL DATA:  Low saturation levels, dyspnea EXAM: CHEST 1 VIEW COMPARISON:  01/18/2017, 12/08/2011 FINDINGS: Endotracheal tube tip is about 4.6 cm superior to carina. Esophageal tube appears coiled in the stomach. Slightly improved pulmonary expansion with better aeration at the bases. Residual patchy left greater than right lung base infiltrates. Stable cardiomediastinal silhouette. No pneumothorax. IMPRESSION: 1. Support lines and tubes as above 2. Improved  expansion with slightly better aeration of lung bases. Patchy left greater than right lung base pulmonary infiltrates. Electronically Signed   By: Jasmine PangKim  Fujinaga M.D.   On: 01/18/2017 20:05   Dg Chest Port 1 View  Result Date: 01/19/2017 CLINICAL DATA:  Pneumonia EXAM: PORTABLE CHEST 1 VIEW COMPARISON:  Yesterday FINDINGS: Endotracheal tube tip at the clavicular heads. An orogastric tube reaches the stomach. Bilateral airspace disease that has progressed in density and extent. No visible cavitation or effusion. No pneumothorax. Normal heart size, distorted by rotation IMPRESSION: 1. Stable positioning of endotracheal and orogastric tubes. 2. Bilateral pneumonia with progressive consolidation. Electronically Signed   By: Marnee SpringJonathon  Watts M.D.   On: 01/19/2017 07:05   Dg Chest Port 1 View  Result Date: 01/18/2017 CLINICAL DATA:  Endotracheal placement. EXAM: PORTABLE CHEST 1 VIEW COMPARISON:  Earlier same day FINDINGS: Endotracheal tip is 3.5 cm above the carina. Nasogastric tube is placed but doubles back upon itself and is present within the distal esophagus. There is worsened infiltrate and collapse in both lower lobes, right worse than left. IMPRESSION: Worsening pneumonia and collapse in the lower lobes right worse than left. Nasogastric tube bent back upon itself, only in the distal esophagus. Endotracheal tube well positioned. Electronically Signed   By: Paulina FusiMark  Shogry M.D.   On: 01/18/2017 15:59   Dg Chest Port 1 View  Result Date: 01/18/2017 CLINICAL DATA:  Shortness of breath. EXAM: PORTABLE CHEST 1 VIEW COMPARISON:  Radiographs of December 08, 2011. FINDINGS: The heart size and mediastinal contours are within normal limits. Hypoinflation of the lungs is noted. Left lung is clear. Increased right basilar opacity is noted concerning for pneumonia. The visualized skeletal structures are unremarkable. IMPRESSION: Increased right basilar opacity is noted consistent with pneumonia. Followup PA and  lateral chest X-ray is recommended in 3-4 weeks following trial of antibiotic therapy to ensure resolution and exclude underlying malignancy. Electronically Signed   By: Lupita RaiderJames  Green Jr, M.D.   On: 01/18/2017 12:53    STUDIES:  CT ABD / Pelvis 12/20 >>   CULTURES: Blood 12/19 >> BCID 12/19 >> staph / MRSA detected    ANTIBIOTICS: Azithromycin 12/19 >>  Ceftriaxone 12/19 x1   Zosyn 12/19 >>   SIGNIFICANT EVENTS: 12/19  Admit with HHNK, shock, RLL PNA, possible pancreatitis.  Decompensated / intubated  12/20  ARDS protocol, paralytics initiated with PF ration <200  LINES/TUBES: ETT 12/19 >>  Femoral CVC 12/19 >>   DISCUSSION: 51 y/o M with DM admitted 12/19 with   ASSESSMENT / PLAN:  PULMONARY A: Acute Hypoxic Respiratory Failure Right lower lobe pneumonia, presumed community acquired pneumonia versus aspiration pneumonia P:   PRVC 6 cc/kg  / low volume ventilation  Wean PEEP / FiO2 per ARDS protocol  Paralytics / sedation per ARDS protocol  Follow intermittent CXR  See ID  CARDIOVASCULAR A:  Shock - hypovolemic + septic   History of atrial fibrillation/flutter on anticoagulation P:  Wean levo, vaso, neo for MAP >65 Continue ALine monitoring  Stress dose steroids, 50 mg IV Q6 IVF as below  ICU monitoring Repeat lactate now  RENAL A:   Acute renal failure, likely due to hypoperfusion Anion gap metabolic acidosis Hypokalemia  Hypophosphatemia  Hypomagnesemia  P:   Sodium bicarbonate at 13425ml/hr  Trend BMP / urinary output Replace electrolytes as indicated, Mg/phos/K 12/20  Avoid nephrotoxic agents, ensure adequate renal perfusion  GASTROINTESTINAL A:   Blood-tinged gastric secretions from NG tube post intubation Acid prophylaxis Rule Out Pancreatitis  P:   NPO  PPI  Hold TF for now in setting of DKA/HHNK Follow pancreatic enzymes   HEMATOLOGIC A:   Chronic anticoagulation for atrial fibrillation DVT prophylaxis P:  Hold home xarelto  Hold  heparin infusion, consider in am 12/21 if no pending procedures  Trend CBC   INFECTIOUS A:   RLL community-acquired pneumonia versus aspiration pneumonia Rule Out Pancreatitis  P:   ABX as above  Follow cultures  Assess CT ABD/pelvis with fever.  Concern for pancreatitis > if confirmed, change zosyn to imipenem  ENDOCRINE A:   Hyperosmolar nonketotic coma/DKA P:   IVF as above Insulin gtt as above  Follow glucose on BMP   NEUROLOGIC A:   Sedation for MV P:   RASS goal: -5 Sedation / paralytics per ARDS protocol   FAMILY  - Updates:  Family updated at bedside per Dr. Delton CoombesByrum   - Inter-disciplinary family meet or Palliative Care meeting due by: 01/25/17  CC Time: 35 minutes   Canary BrimBrandi Ollis, NP-C [facility] Pgr: [phone] or if no answer [phone] 01/19/2017, 11:52 AM

## 2017-01-19 NOTE — Progress Notes (Signed)
CRITICAL VALUE ALERT  Critical Value: lactic 5.3  Date & Time Notied:  01/19/17  Provider Notified: Pola CornElink  Orders Received/Actions taken:

## 2017-01-19 NOTE — Progress Notes (Signed)
PHARMACY - PHYSICIAN COMMUNICATION CRITICAL VALUE ALERT - BLOOD CULTURE IDENTIFICATION (BCID)  [name] is an 51 y.o. male [facility] with a chief complaint of AMS.  He is currently intubated on insulin drip for DKA, antibiotics for aspiration PNA, pressor support.  Assessment: No clear source at present, patient still febrile (Tm 105.40F) Name of physician (or Provider) Contacted: Byrum  Current antibiotics: Zosyn 3.375gm IV Q8h to be infused over 4hrs, Zithromax 500mg  IV q24h  Changes to prescribed antibiotics recommended: Add Vancomycin 1500mg  IV x1 now then 1gm IV q24h Recommendations accepted by provider  Results for orders placed or performed during the hospital encounter of 01/18/17  Blood Culture ID Panel (Reflexed) (Collected: 01/18/2017 12:30 PM)  Result Value Ref Range   Enterococcus species NOT DETECTED NOT DETECTED   Listeria monocytogenes NOT DETECTED NOT DETECTED   Staphylococcus species DETECTED (A) NOT DETECTED   Staphylococcus aureus NOT DETECTED NOT DETECTED   Methicillin resistance DETECTED (A) NOT DETECTED   Streptococcus species NOT DETECTED NOT DETECTED   Streptococcus agalactiae NOT DETECTED NOT DETECTED   Streptococcus pneumoniae NOT DETECTED NOT DETECTED   Streptococcus pyogenes NOT DETECTED NOT DETECTED   Acinetobacter baumannii NOT DETECTED NOT DETECTED   Enterobacteriaceae species NOT DETECTED NOT DETECTED   Enterobacter cloacae complex NOT DETECTED NOT DETECTED   Escherichia coli NOT DETECTED NOT DETECTED   Klebsiella oxytoca NOT DETECTED NOT DETECTED   Klebsiella pneumoniae NOT DETECTED NOT DETECTED   Proteus species NOT DETECTED NOT DETECTED   Serratia marcescens NOT DETECTED NOT DETECTED   Haemophilus influenzae NOT DETECTED NOT DETECTED   Neisseria meningitidis NOT DETECTED NOT DETECTED   Pseudomonas aeruginosa NOT DETECTED NOT DETECTED   Candida albicans NOT DETECTED NOT DETECTED   Candida glabrata NOT  DETECTED NOT DETECTED   Candida krusei NOT DETECTED NOT DETECTED   Candida parapsilosis NOT DETECTED NOT DETECTED   Candida tropicalis NOT DETECTED NOT DETECTED    [name], [name] 01/19/2017  10:41 AM

## 2017-01-19 NOTE — ED Provider Notes (Signed)
[facility]-ICU/STEPDOWN Provider Note   CSN: [id number] Arrival date & time: 01/18/17  1135     History   Chief Complaint Chief Complaint  Patient presents with  . Hyperglycemia  . Hypotension  . Altered Mental Status    HPI [name] is a 51 y.o. male. CC: Diabetic, weakness, not eating.  HPI: 51 year old male. History of insulin-dependent diabetes. He has not felt well for the last 2-3 days. He arrives here apparently after calling his father. His father called 28 and the patient was brought here via ambulance. He is short of breath and confused. He is hyperglycemic. He is able answer some questions but is someone cephalopathic upon arrival.  Per past medical history has history of transient atrial flutter in the past. Not currently anticoagulated. Hypertension, type 2 diabetes, now insulin-dependent.   Most recent medical interaction was with his ophthalmologist for a coagulation icterus hemorrhage in the last month. No recent admissions.    Past Medical History:  Diagnosis Date  . Atrial fib/flutter, transient 09/19/2011  . Elevated cholesterol with elevated triglycerides   . Hypertension   . Kidney stones   . Pancreatitis 1999  . Pneumonia ~ 2003  . Sleep apnea    nO OFFICALLY DIAGNOSIED, SNORES LOUDLY AND WIFE HAS WITNESSED APNEA WHEN HE IS SLEEPING  . Type II diabetes mellitus [facility])     Patient Active Problem List   Diagnosis Date Noted  . Malnutrition of moderate degree 01/19/2017  . Pneumonia 01/18/2017  . Atrial flutter ([place]) 09/19/2011  . Obesity 09/19/2011  . Diabetes mellitus type II 09/19/2011  . Mixed hyperlipidemia 09/19/2011  . Hypertension   . Chronic kidney disease   . Pancreatitis   . Elevated cholesterol with elevated triglycerides   . Sleep apnea   . Vitreous hemorrhage ([place]) 08/03/2011    Past Surgical History:  Procedure Laterality Date  . CARDIOVERSION  09/30/2011   Procedure: CARDIOVERSION;  Surgeon:  Josue Hector, MD;  Location: [facility] ENDOSCOPY;  Service: Cardiovascular;  Laterality: N/A;  . EYE SURGERY    . PARS PLANA VITRECTOMY  12/08/2011   left  . PARS PLANA VITRECTOMY  12/08/2011   Procedure: PARS PLANA VITRECTOMY WITH 25 GAUGE;  Surgeon: Hayden Pedro, MD;  Location: [place];  Service: Ophthalmology;  Laterality: Left;  . TEE WITHOUT CARDIOVERSION  09/30/2011   Procedure: TRANSESOPHAGEAL ECHOCARDIOGRAM (TEE);  Surgeon: Josue Hector, MD;  Location: [facility] ENDOSCOPY;  Service: Cardiovascular;  Laterality: N/A;  kristine/ebp/beverly ( or scheduling)/ time rescheduled from 1300 to 1200 talked ( mary)       Home Medications    Prior to Admission medications   Medication Sig Start Date End Date Taking? Authorizing Provider  Ascorbic Acid (VITAMIN C) 1000 MG tablet Take 500 mg by mouth daily.   Yes [provider]  aspirin EC 81 MG tablet Take 81 mg by mouth daily.   Yes [provider]  fexofenadine (ALLEGRA) 180 MG tablet Take 180 mg by mouth daily.   Yes [provider]  metFORMIN (GLUCOPHAGE) 850 MG tablet Take 850 mg by mouth daily.   Yes [provider]  Multiple Vitamin (MULTIVITAMIN WITH MINERALS) TABS tablet Take 1 tablet by mouth daily.   Yes [provider]  niacin 500 MG tablet Take 500 mg by mouth 2 (two) times daily with a meal.   Yes [provider]  bacitracin-polymyxin b (POLYSPORIN) ophthalmic ointment Place 1 application into the left eye 4 (four) times daily.  apply to eye every 12 hours while awake Patient not taking: Reported on 01/18/2017 12/09/11   Hayden Pedro, MD  brimonidine [facility]) 0.2 % ophthalmic solution Place 1 drop into the left eye 2 (two) times daily. Patient not taking: Reported on 01/18/2017 12/09/11   Hayden Pedro, MD  gatifloxacin (ZYMAXID) 0.5 % SOLN Place 1 drop into the left eye 4 (four) times daily. Patient not taking: Reported on 01/18/2017 12/09/11   Hayden Pedro, MD  latanoprost  (XALATAN) 0.005 % ophthalmic solution Place 1 drop into the left eye at bedtime. Patient not taking: Reported on 01/18/2017 12/09/11   Hayden Pedro, MD  prednisoLONE acetate (PRED FORTE) 1 % ophthalmic suspension Place 1 drop into the left eye 4 (four) times daily. Patient not taking: Reported on 01/18/2017 12/09/11   Hayden Pedro, MD  Rivaroxaban (XARELTO) 20 MG TABS Take 1 tablet (20 mg total) by mouth daily. Patient not taking: Reported on 01/18/2017 09/26/11   Lelon Perla, MD    Family History History reviewed. No pertinent family history.  Social History Social History   Tobacco Use  . Smoking status: Never Smoker  . Smokeless tobacco: Never Used  Substance Use Topics  . Alcohol use: Yes    Comment: 12/08/2011 "maybe 1-2 beers/month average"  . Drug use: No     Allergies   Patient has no known allergies.   Review of Systems Review of Systems  Unable to perform ROS: Mental status change   Level V caveat for encephalopathy. He is able answer some questions. Some were obtained to his father. He has had a cough and been short of breath. He cannot comment on his blood sugars. He is still weak. He does not think he has eaten for the last 2 days. He cannot recall his last insulin. Otherwise unobtainable.   Physical Exam Updated Vital Signs BP 126/70   Pulse 96   Temp (!) 104.4 F (40.2 C)   Resp (!) 32   Ht _0  (1.753 m)   Wt 86.4 kg (190 lb 7.6 oz)   SpO2 98%   BMI 28.13 kg/m   Physical Exam  Constitutional: He appears distressed.  He appears acutely ill. Confused. Tachypneic. Hypothermic at 94 rectal. Saturating 88% on room air. Placed on 2 L nasal cannula.  HENT:  Head: Normocephalic.  Eyes: Conjunctivae are normal. Pupils are equal, round, and reactive to light. No scleral icterus.  Neck: Normal range of motion. Neck supple. No thyromegaly present.  Cardiovascular: Exam reveals no gallop and no friction rub.  No murmur heard. Tachycardic. Regular.  Sinus on the monitor. Good peripheral perfusion.  Pulmonary/Chest: No respiratory distress. He has no wheezes. He has no rales.  Bibasilar crackles. Increased work of breathing and tachypnea.  Abdominal: Soft. Bowel sounds are normal. He exhibits no distension. There is no tenderness. There is no rebound.  Musculoskeletal: Normal range of motion. He exhibits tenderness.  Neurological: He is alert.  Awake. Opens eyes to voice. Confused. Moving all 470s without lateralizing neurological symptoms.  Skin: Skin is warm and dry. Capillary refill takes 2 to 3 seconds. No rash noted.  Psychiatric: He has a normal mood and affect. His behavior is normal.     ED Treatments / Results  Labs (all labs ordered are listed, but only abnormal results are displayed) Labs Reviewed  BLOOD CULTURE ID PANEL (REFLEXED) - Abnormal; Notable for the following components:      Result Value   Staphylococcus species  DETECTED (*)    Methicillin resistance DETECTED (*)    All other components within normal limits  URINALYSIS, ROUTINE W REFLEX MICROSCOPIC - Abnormal; Notable for the following components:   APPearance CLOUDY (*)    Glucose, UA >=500 (*)    Hgb urine dipstick SMALL (*)    Ketones, ur 20 (*)    Protein, ur 30 (*)    Bacteria, UA RARE (*)    Squamous Epithelial / LPF 0-5 (*)    All other components within normal limits  COMPREHENSIVE METABOLIC PANEL - Abnormal; Notable for the following components:   Chloride 98 (*)    CO2 12 (*)    Glucose, Bld 814 (*)    BUN 57 (*)    Creatinine, Ser 1.94 (*)    Albumin 2.6 (*)    ALT 15 (*)    Total Bilirubin 2.7 (*)    GFR calc non Af Amer 38 (*)    GFR calc Af Amer 44 (*)    Anion gap 27 (*)    All other components within normal limits  BLOOD GAS, ARTERIAL - Abnormal; Notable for the following components:   pH, Arterial 7.257 (*)    pO2, Arterial 72.3 (*)    Bicarbonate 15.1 (*)    Acid-base deficit 11.0 (*)    All other components within normal  limits  LACTIC ACID, PLASMA - Abnormal; Notable for the following components:   Lactic Acid, Venous 5.3 (*)    All other components within normal limits  LACTIC ACID, PLASMA - Abnormal; Notable for the following components:   Lactic Acid, Venous 4.8 (*)    All other components within normal limits  CBC - Abnormal; Notable for the following components:   WBC 3.0 (*)    Hemoglobin 12.0 (*)    HCT 34.9 (*)    All other components within normal limits  BLOOD GAS, ARTERIAL - Abnormal; Notable for the following components:   pH, Arterial 7.164 (*)    pCO2 arterial 55.8 (*)    Bicarbonate 18.2 (*)    Acid-base deficit 9.2 (*)    All other components within normal limits  MAGNESIUM - Abnormal; Notable for the following components:   Magnesium 1.6 (*)    All other components within normal limits  PHOSPHORUS - Abnormal; Notable for the following components:   Phosphorus 1.2 (*)    All other components within normal limits  TRIGLYCERIDES - Abnormal; Notable for the following components:   Triglycerides 608 (*)    All other components within normal limits  GLUCOSE, CAPILLARY - Abnormal; Notable for the following components:   Glucose-Capillary 381 (*)    All other components within normal limits  TRIGLYCERIDES - Abnormal; Notable for the following components:   Triglycerides 716 (*)    All other components within normal limits  LACTIC ACID, PLASMA - Abnormal; Notable for the following components:   Lactic Acid, Venous 5.3 (*)    All other components within normal limits  LACTIC ACID, PLASMA - Abnormal; Notable for the following components:   Lactic Acid, Venous 5.0 (*)    All other components within normal limits  BASIC METABOLIC PANEL - Abnormal; Notable for the following components:   Chloride 116 (*)    CO2 20 (*)    Glucose, Bld 172 (*)    BUN 45 (*)    Creatinine, Ser 2.15 (*)    Calcium 6.3 (*)    GFR calc non Af Amer 33 (*)    GFR  calc Af Amer 39 (*)    All other components  within normal limits  BLOOD GAS, ARTERIAL - Abnormal; Notable for the following components:   pH, Arterial 7.174 (*)    pCO2 arterial 49.0 (*)    pO2, Arterial 183 (*)    Bicarbonate 16.5 (*)    Acid-base deficit 10.7 (*)    All other components within normal limits  GLUCOSE, CAPILLARY - Abnormal; Notable for the following components:   Glucose-Capillary 322 (*)    All other components within normal limits  GLUCOSE, CAPILLARY - Abnormal; Notable for the following components:   Glucose-Capillary 253 (*)    All other components within normal limits  GLUCOSE, CAPILLARY - Abnormal; Notable for the following components:   Glucose-Capillary 241 (*)    All other components within normal limits  GLUCOSE, CAPILLARY - Abnormal; Notable for the following components:   Glucose-Capillary 205 (*)    All other components within normal limits  GLUCOSE, CAPILLARY - Abnormal; Notable for the following components:   Glucose-Capillary 210 (*)    All other components within normal limits  GLUCOSE, CAPILLARY - Abnormal; Notable for the following components:   Glucose-Capillary 175 (*)    All other components within normal limits  GLUCOSE, CAPILLARY - Abnormal; Notable for the following components:   Glucose-Capillary 161 (*)    All other components within normal limits  LIPASE, BLOOD - Abnormal; Notable for the following components:   Lipase 297 (*)    All other components within normal limits  AMYLASE - Abnormal; Notable for the following components:   Amylase 411 (*)    All other components within normal limits  GLUCOSE, CAPILLARY - Abnormal; Notable for the following components:   Glucose-Capillary 125 (*)    All other components within normal limits  GLUCOSE, CAPILLARY - Abnormal; Notable for the following components:   Glucose-Capillary 144 (*)    All other components within normal limits  GLUCOSE, CAPILLARY - Abnormal; Notable for the following components:   Glucose-Capillary 116 (*)     All other components within normal limits  GLUCOSE, CAPILLARY - Abnormal; Notable for the following components:   Glucose-Capillary 196 (*)    All other components within normal limits  BASIC METABOLIC PANEL - Abnormal; Notable for the following components:   Chloride 115 (*)    CO2 21 (*)    Glucose, Bld 228 (*)    BUN 46 (*)    Creatinine, Ser 1.95 (*)    Calcium 7.0 (*)    GFR calc non Af Amer 37 (*)    GFR calc Af Amer 43 (*)    All other components within normal limits  GLUCOSE, CAPILLARY - Abnormal; Notable for the following components:   Glucose-Capillary 202 (*)    All other components within normal limits  GLUCOSE, CAPILLARY - Abnormal; Notable for the following components:   Glucose-Capillary 206 (*)    All other components within normal limits  GLUCOSE, CAPILLARY - Abnormal; Notable for the following components:   Glucose-Capillary 150 (*)    All other components within normal limits  GLUCOSE, CAPILLARY - Abnormal; Notable for the following components:   Glucose-Capillary 190 (*)    All other components within normal limits  BLOOD GAS, ARTERIAL - Abnormal; Notable for the following components:   pH, Arterial 7.261 (*)    Bicarbonate 19.4 (*)    Acid-base deficit 5.6 (*)    All other components within normal limits  GLUCOSE, CAPILLARY - Abnormal; Notable for the following  components:   Glucose-Capillary 22 (*)    All other components within normal limits  GLUCOSE, CAPILLARY - Abnormal; Notable for the following components:   Glucose-Capillary 161 (*)    All other components within normal limits  LACTIC ACID, PLASMA - Abnormal; Notable for the following components:   Lactic Acid, Venous 3.9 (*)    All other components within normal limits  GLUCOSE, CAPILLARY - Abnormal; Notable for the following components:   Glucose-Capillary 164 (*)    All other components within normal limits  GLUCOSE, CAPILLARY - Abnormal; Notable for the following components:    Glucose-Capillary 131 (*)    All other components within normal limits  CBG MONITORING, ED - Abnormal; Notable for the following components:   Glucose-Capillary >600 (*)    All other components within normal limits  CBG MONITORING, ED - Abnormal; Notable for the following components:   Glucose-Capillary >600 (*)    All other components within normal limits  I-STAT CG4 LACTIC ACID, ED - Abnormal; Notable for the following components:   Lactic Acid, Venous 1.97 (*)    All other components within normal limits  CBG MONITORING, ED - Abnormal; Notable for the following components:   Glucose-Capillary >600 (*)    All other components within normal limits  CBG MONITORING, ED - Abnormal; Notable for the following components:   Glucose-Capillary >600 (*)    All other components within normal limits  CULTURE, BLOOD (ROUTINE X 2)  CULTURE, BLOOD (ROUTINE X 2)  MRSA PCR SCREENING  CULTURE, RESPIRATORY (NON-EXPECTORATED)  URINE CULTURE  CBC  RAPID URINE DRUG SCREEN, HOSP PERFORMED  ETHANOL  HIV ANTIBODY (ROUTINE TESTING)  GLUCOSE, CAPILLARY  I-STAT CG4 LACTIC ACID, ED    EKG  EKG Interpretation None       Radiology Dg Chest 1 View  Result Date: 01/18/2017 CLINICAL DATA:  Low saturation levels, dyspnea EXAM: CHEST 1 VIEW COMPARISON:  01/18/2017, 12/08/2011 FINDINGS: Endotracheal tube tip is about 4.6 cm superior to carina. Esophageal tube appears coiled in the stomach. Slightly improved pulmonary expansion with better aeration at the bases. Residual patchy left greater than right lung base infiltrates. Stable cardiomediastinal silhouette. No pneumothorax. IMPRESSION: 1. Support lines and tubes as above 2. Improved expansion with slightly better aeration of lung bases. Patchy left greater than right lung base pulmonary infiltrates. Electronically Signed   By: Donavan Foil M.D.   On: 01/18/2017 20:05   Dg Chest Port 1 View  Result Date: 01/19/2017 CLINICAL DATA:  Pneumonia EXAM:  PORTABLE CHEST 1 VIEW COMPARISON:  Yesterday FINDINGS: Endotracheal tube tip at the clavicular heads. An orogastric tube reaches the stomach. Bilateral airspace disease that has progressed in density and extent. No visible cavitation or effusion. No pneumothorax. Normal heart size, distorted by rotation IMPRESSION: 1. Stable positioning of endotracheal and orogastric tubes. 2. Bilateral pneumonia with progressive consolidation. Electronically Signed   By: Monte Fantasia M.D.   On: 01/19/2017 07:05   Dg Chest Port 1 View  Result Date: 01/18/2017 CLINICAL DATA:  Endotracheal placement. EXAM: PORTABLE CHEST 1 VIEW COMPARISON:  Earlier same day FINDINGS: Endotracheal tip is 3.5 cm above the carina. Nasogastric tube is placed but doubles back upon itself and is present within the distal esophagus. There is worsened infiltrate and collapse in both lower lobes, right worse than left. IMPRESSION: Worsening pneumonia and collapse in the lower lobes right worse than left. Nasogastric tube bent back upon itself, only in the distal esophagus. Endotracheal tube well positioned. Electronically Signed  By: Nelson Chimes M.D.   On: 01/18/2017 15:59   Dg Chest Port 1 View  Result Date: 01/18/2017 CLINICAL DATA:  Shortness of breath. EXAM: PORTABLE CHEST 1 VIEW COMPARISON:  Radiographs of December 08, 2011. FINDINGS: The heart size and mediastinal contours are within normal limits. Hypoinflation of the lungs is noted. Left lung is clear. Increased right basilar opacity is noted concerning for pneumonia. The visualized skeletal structures are unremarkable. IMPRESSION: Increased right basilar opacity is noted consistent with pneumonia. Followup PA and lateral chest X-ray is recommended in 3-4 weeks following trial of antibiotic therapy to ensure resolution and exclude underlying malignancy. Electronically Signed   By: Marijo Conception, M.D.   On: 01/18/2017 12:53    Procedures Procedures (including critical care  time)  Medications Ordered in ED Medications  pantoprazole (PROTONIX) injection 40 mg (not administered)  0.9 %  sodium chloride infusion (not administered)  heparin injection 5,000 Units (5,000 Units Subcutaneous Given 01/19/17 1346)  0.9 %  sodium chloride infusion ( Intravenous Stopped 01/19/17 1239)  0.9 %  sodium chloride infusion ( Intravenous Stopped 01/18/17 2300)  insulin regular (NOVOLIN R,HUMULIN R) 100 Units in sodium chloride 0.9 % 100 mL (1 Units/mL) infusion (4.2 Units/hr Intravenous Rate/Dose Change 01/19/17 1415)  azithromycin (ZITHROMAX) 500 mg in dextrose 5 % 250 mL IVPB (not administered)  piperacillin-tazobactam (ZOSYN) IVPB 3.375 g (0 g Intravenous Stopped 01/19/17 1400)  vasopressin (PITRESSIN) 40 Units in sodium chloride 0.9 % 250 mL (0.16 Units/mL) infusion (0.03 Units/min Intravenous Rate/Dose Verify 01/19/17 1400)  chlorhexidine gluconate (MEDLINE KIT) (PERIDEX) 0.12 % solution 15 mL (15 mLs Mouth Rinse Given 01/19/17 0800)  MEDLINE mouth rinse (15 mLs Mouth Rinse Given 01/19/17 1200)  norepinephrine (LEVOPHED) 16 mg in dextrose 5 % 250 mL (0.064 mg/mL) infusion (40 mcg/min Intravenous Rate/Dose Verify 01/19/17 1400)  artificial tears (LACRILUBE) ophthalmic ointment 1 application (1 application Both Eyes Given 01/19/17 1349)  midazolam (VERSED) injection 2 mg (2 mg Intravenous Not Given 01/19/17 0403)  midazolam (VERSED) injection 2 mg (not administered)  midazolam (VERSED) 50 mg in sodium chloride 0.9 % 50 mL (1 mg/mL) infusion (4 mg/hr Intravenous Rate/Dose Verify 01/19/17 1400)  midazolam (VERSED) bolus via infusion 2 mg (not administered)  fentaNYL (SUBLIMAZE) injection 100 mcg (100 mcg Intravenous Not Given 01/19/17 0403)  fentaNYL (SUBLIMAZE) injection 100 mcg (not administered)  fentaNYL (SUBLIMAZE) 2,500 mcg in sodium chloride 0.9 % 250 mL (10 mcg/mL) infusion (150 mcg/hr Intravenous Rate/Dose Verify 01/19/17 1400)  fentaNYL (SUBLIMAZE) bolus via infusion 50  mcg (not administered)  cisatracurium (NIMBEX) bolus via infusion 10 mg (10 mg Intravenous Not Given 01/19/17 0335)  cisatracurium (NIMBEX) 200 mg in sodium chloride 0.9 % 200 mL (1 mg/mL) infusion (5 mcg/kg/min  99.8 kg Intravenous Rate/Dose Verify 01/19/17 1400)  0.9 %  sodium chloride infusion (not administered)  sodium bicarbonate 150 mEq in dextrose 5 % 1,000 mL infusion ( Intravenous Rate/Dose Verify 01/19/17 1400)  phenylephrine (NEO-SYNEPHRINE) 40 mg in sodium chloride 0.9 % 250 mL (0.16 mg/mL) infusion (250 mcg/min Intravenous Rate/Dose Verify 01/19/17 1400)  fludrocortisone (FLORINEF) tablet 0.05 mg (0.05 mg Oral Given 01/19/17 0625)  hydrocortisone sodium succinate (SOLU-CORTEF) 100 MG injection 50 mg (50 mg Intravenous Given 01/19/17 1200)  sodium chloride flush (NS) 0.9 % injection 10-40 mL (10 mLs Intracatheter Given 01/19/17 1148)  sodium chloride flush (NS) 0.9 % injection 10-40 mL (40 mLs Intracatheter Given 01/19/17 1000)  Chlorhexidine Gluconate Cloth 2 % PADS 6 each (6 each Topical Given 01/19/17 1125)  acetaminophen (TYLENOL) solution 650 mg (650 mg Per Tube Given 01/19/17 1033)  vancomycin (VANCOCIN) IVPB 1000 mg/200 mL premix (not administered)  potassium PHOSPHATE 20 mEq in dextrose 5 % 250 mL infusion (20 mEq Intravenous New Bag/Given 01/19/17 1349)  iopamidol (ISOVUE-300) 61 % injection (not administered)  sodium chloride 0.9 % bolus 1,000 mL (0 mLs Intravenous Stopped 01/18/17 1322)    And  sodium chloride 0.9 % bolus 1,000 mL (0 mLs Intravenous Stopped 01/18/17 1615)    And  sodium chloride 0.9 % bolus 1,000 mL (0 mLs Intravenous Stopped 01/18/17 1542)    And  sodium chloride 0.9 % bolus 500 mL (0 mLs Intravenous Stopped 01/18/17 1758)  azithromycin (ZITHROMAX) 500 mg in dextrose 5 % 250 mL IVPB (0 mg Intravenous Stopped 01/18/17 1523)  cefTRIAXone (ROCEPHIN) 2 g in dextrose 5 % 50 mL IVPB (0 g Intravenous Stopped 01/18/17 1405)  norepinephrine (LEVOPHED) 4 mg in  dextrose 5 % 250 mL (0.016 mg/mL) infusion (5 mcg/min Intravenous Rate/Dose Change 01/18/17 1635)  propofol (DIPRIVAN) 1000 MG/100ML infusion (15 mcg/kg/min  99.8 kg Intravenous Rate/Dose Change 01/18/17 1714)  pantoprazole (PROTONIX) 80 mg in sodium chloride 0.9 % 100 mL IVPB (0 mg Intravenous Stopped 01/18/17 1724)  piperacillin-tazobactam (ZOSYN) IVPB 3.375 g (0 g Intravenous Stopped 01/18/17 1812)  sodium chloride 0.9 % bolus 2,000 mL (2,000 mLs Intravenous New Bag/Given 01/18/17 1705)  sodium chloride 0.9 % bolus 2,000 mL (0 mLs Intravenous Stopped 01/18/17 2055)  phenylephrine (NEOSYNEPHRINE) 10-0.9 MG/250ML-% infusion (400 mg  New Bag/Given 01/19/17 0430)  sodium bicarbonate injection 100 mEq (100 mEq Intravenous Given 01/19/17 0459)  magnesium sulfate IVPB 2 g 50 mL (0 g Intravenous Stopped 01/19/17 1130)  potassium chloride 20 MEQ/15ML (10%) solution 40 mEq (40 mEq Per Tube Given 01/19/17 1033)  vancomycin (VANCOCIN) 1,500 mg in sodium chloride 0.9 % 500 mL IVPB (0 mg Intravenous Stopped 01/19/17 1330)     Initial Impression / Assessment and Plan / ED Course  I have reviewed the triage vital signs and the nursing notes.  Pertinent labs & imaging results that were available during my care of the patient were reviewed by me and considered in my medical decision making (see chart for details).   patient tachypneic, tachycardic, confused, hypothermic. Hyperglycemic at over 600.   Patient is acutely ill. Multiple interventions initiated. Placed on O2. 2 IVs placed and fluid bolus initiated. Sepsis orders initiated. Lactate obtained. Slight elevation. Sepsis fluids at 30/kg totaling 3500. Continues tachycardic and has hypotension. Requiring increase O2 commands over 10 L via mask. Chest x-ray shows right lower lobe pneumonia. His confusion worsens. Concerned about his O2 demand and his mental status and thus have intubated patient as central line placed. Initially, he required pneumonia  antibiotics given the Rocephin and Zithromax. Upon intubation Gastric secretions noted in patient's mouth and increased concern for aspiration. Patient given Zosyn. Started on Levophed via peripheral IV until central access obtained and then given incremental in increasing doses of pressors for blood pressure control. Overall the patient has responded well to the above interventions. He remains acutely ill. His hemoglobin is below normal but MG returns dark blood is given Protonix bolus and infusion. Care discussed with Dr. Lamonte Sakai of critical care who is here evaluating the patient for admission.  CRITICAL CARE Performed by: Lolita Patella   Total critical care time: 60 minutes  Critical care time was exclusive of separately billable procedures and treating other patients.  Critical care was necessary to treat or prevent  imminent or life-threatening deterioration.  Critical care was time spent personally by me on the following activities: development of treatment plan with patient and/or surrogate as well as nursing, discussions with consultants, evaluation of patient's response to treatment, examination of patient, obtaining history from patient or surrogate, ordering and performing treatments and interventions, ordering and review of laboratory studies, ordering and review of radiographic studies, pulse oximetry and re-evaluation of patient's condition.  INTUBATION Performed by: Lolita Patella  Required items: required blood products, implants, devices, and special equipment available Patient identity confirmed: provided demographic data and hospital-assigned identification number Time out: Immediately prior to procedure a "time out" was called to verify the correct patient, procedure, equipment, support staff and site/side marked as required.  Indications:  Hypoximia, encephalopathy  Intubation method: +Glidescope Laryngoscopy   Preoxygenation: +BVM  Sedatives:  +Etomidate Paralytic: +Succinylcholine  Tube Size: 8.0 cuffed  Post-procedure assessment: chest rise and ETCO2 monitor Breath sounds: equal and absent over the epigastrium Tube secured with: ETT holder Chest x-ray interpreted by radiologist and me.  Chest x-ray findings: +endotracheal tube in appropriate position  Patient tolerated the procedure well with no immediate complications.  CENTRAL LINE Performed by: Lolita Patella Consent: The procedure was performed in an emergent situation. Required items: required blood products, implants, devices, and special equipment available Patient identity confirmed: arm band and provided demographic data Time out: Immediately prior to procedure a "time out" was called to verify the correct patient, procedure, equipment, support staff and site/side marked as required. Indications: vascular access Anesthesia: local infiltration Local anesthetic: lidocaine 1% with epinephrine Anesthetic total: 3 ml Patient sedated: no Preparation: skin prepped with 2% chlorhexidine Skin prep agent dried: skin prep agent completely dried prior to procedure Sterile barriers: all five maximum sterile barriers used - cap, mask, sterile gown, sterile gloves, and large sterile sheet Hand hygiene: hand hygiene performed prior to central venous catheter insertion  Location details: +RIGHT fEMORAL VEIN  Catheter type: triple lumen Catheter size: 8 Fr Pre-procedure: landmarks identified Ultrasound guidance: yES Successful placement: yes Post-procedure: line sutured and dressing applied Assessment: blood return through all parts, free fluid flow,  Patient tolerance: Patient tolerated the procedure well with no immediate complications.      Final Clinical Impressions(s) / ED Diagnoses   Final diagnoses:  Dyspnea  Acute respiratory failure with hypoxia [facility])    ED Discharge Orders    None       Tanna Furry, MD 01/19/17 [phone]

## 2017-01-19 NOTE — Progress Notes (Signed)
Patient having PF ratio less than 200, high oxygen requirements and high PEEP requirements.  Possible ARDS based on infiltrates on chest x-ray concern for sepsis.  Additionally he has DKA elevated triglycerides elevated amylase and lipase it is possible he has pancreatitis which could also lead to ARDS.  Decision made to initiate paralytic infusion and begin bicarb infusion for metabolic acidosis.  Evans Lanceennis Laurence, MD, critical care medicine

## 2017-01-20 ENCOUNTER — Inpatient Hospital Stay (HOSPITAL_COMMUNITY): Payer: BC Managed Care – PPO

## 2017-01-20 LAB — BASIC METABOLIC PANEL
ANION GAP: 11 (ref 5–15)
Anion gap: 13 (ref 5–15)
Anion gap: 12 (ref 5–15)
Anion gap: 10 (ref 5–15)
Anion gap: 7 (ref 5–15)
BUN: 45 mg/dL — AB (ref 6–20)
BUN: 48 mg/dL — ABNORMAL HIGH (ref 6–20)
BUN: 46 mg/dL — ABNORMAL HIGH (ref 6–20)
BUN: 46 mg/dL — ABNORMAL HIGH (ref 6–20)
BUN: 45 mg/dL — AB (ref 6–20)
CALCIUM: 6 mg/dL — AB (ref 8.9–10.3)
CALCIUM: 5.8 mg/dL — AB (ref 8.9–10.3)
CALCIUM: 6.1 mg/dL — AB (ref 8.9–10.3)
CALCIUM: 6.1 mg/dL — AB (ref 8.9–10.3)
CHLORIDE: 106 mmol/L (ref 101–111)
CO2: 19 mmol/L — AB (ref 22–32)
CO2: 21 mmol/L — AB (ref 22–32)
CO2: 20 mmol/L — AB (ref 22–32)
CO2: 20 mmol/L — AB (ref 22–32)
CO2: 22 mmol/L (ref 22–32)
CREATININE: 2.33 mg/dL — AB (ref 0.61–1.24)
CREATININE: 2.22 mg/dL — AB (ref 0.61–1.24)
CREATININE: 2.32 mg/dL — AB (ref 0.61–1.24)
CREATININE: 2.25 mg/dL — AB (ref 0.61–1.24)
Calcium: 6 mg/dL — CL (ref 8.9–10.3)
Chloride: 106 mmol/L (ref 101–111)
Chloride: 107 mmol/L (ref 101–111)
Chloride: 110 mmol/L (ref 101–111)
Chloride: 111 mmol/L (ref 101–111)
Creatinine, Ser: 2.27 mg/dL — ABNORMAL HIGH (ref 0.61–1.24)
GFR calc Af Amer: 36 mL/min — ABNORMAL LOW (ref >60)
GFR calc Af Amer: 35 mL/min — ABNORMAL LOW (ref >60)
GFR calc Af Amer: 37 mL/min — ABNORMAL LOW (ref >60)
GFR calc non Af Amer: 31 mL/min — ABNORMAL LOW (ref >60)
GFR calc non Af Amer: 32 mL/min — ABNORMAL LOW (ref >60)
GFR, EST AFRICAN AMERICAN: 35 mL/min — AB (ref >60)
GFR, EST AFRICAN AMERICAN: 37 mL/min — AB (ref >60)
GFR, EST NON AFRICAN AMERICAN: 30 mL/min — AB (ref >60)
GFR, EST NON AFRICAN AMERICAN: 32 mL/min — AB (ref >60)
GFR, EST NON AFRICAN AMERICAN: 30 mL/min — AB (ref >60)
GLUCOSE: 367 mg/dL — AB (ref 65–99)
GLUCOSE: 377 mg/dL — AB (ref 65–99)
GLUCOSE: 256 mg/dL — AB (ref 65–99)
GLUCOSE: 175 mg/dL — AB (ref 65–99)
Glucose, Bld: 343 mg/dL — ABNORMAL HIGH (ref 65–99)
POTASSIUM: 4 mmol/L (ref 3.5–5.1)
Potassium: 3.7 mmol/L (ref 3.5–5.1)
Potassium: 3.4 mmol/L — ABNORMAL LOW (ref 3.5–5.1)
Potassium: 3.6 mmol/L (ref 3.5–5.1)
Potassium: 3.5 mmol/L (ref 3.5–5.1)
SODIUM: 138 mmol/L (ref 135–145)
Sodium: 139 mmol/L (ref 135–145)
Sodium: 138 mmol/L (ref 135–145)
Sodium: 140 mmol/L (ref 135–145)
Sodium: 140 mmol/L (ref 135–145)

## 2017-01-20 LAB — BLOOD GAS, ARTERIAL
ACID-BASE DEFICIT: 4.4 mmol/L — AB (ref 0.0–2.0)
BICARBONATE: 19.2 mmol/L — AB (ref 20.0–28.0)
Drawn by: 11249
FIO2: 40
LHR: 32 {breaths}/min
MECHVT: 450 mL
O2 SAT: 94.9 %
PATIENT TEMPERATURE: 100.4
PCO2 ART: 34.7 mmHg (ref 32.0–48.0)
PEEP/CPAP: 12 cmH2O
PH ART: 7.368 (ref 7.350–7.450)
PO2 ART: 73.5 mmHg — AB (ref 83.0–108.0)

## 2017-01-20 LAB — GLUCOSE, CAPILLARY
GLUCOSE-CAPILLARY: 22 mg/dL — AB (ref 65–99)
GLUCOSE-CAPILLARY: 327 mg/dL — AB (ref 65–99)
GLUCOSE-CAPILLARY: 297 mg/dL — AB (ref 65–99)
GLUCOSE-CAPILLARY: 321 mg/dL — AB (ref 65–99)
GLUCOSE-CAPILLARY: 189 mg/dL — AB (ref 65–99)
GLUCOSE-CAPILLARY: 173 mg/dL — AB (ref 65–99)
GLUCOSE-CAPILLARY: 132 mg/dL — AB (ref 65–99)
Glucose-Capillary: 141 mg/dL — ABNORMAL HIGH (ref 65–99)
Glucose-Capillary: 146 mg/dL — ABNORMAL HIGH (ref 65–99)
Glucose-Capillary: 151 mg/dL — ABNORMAL HIGH (ref 65–99)
Glucose-Capillary: 220 mg/dL — ABNORMAL HIGH (ref 65–99)
Glucose-Capillary: 233 mg/dL — ABNORMAL HIGH (ref 65–99)
Glucose-Capillary: 240 mg/dL — ABNORMAL HIGH (ref 65–99)
Glucose-Capillary: 280 mg/dL — ABNORMAL HIGH (ref 65–99)
Glucose-Capillary: 337 mg/dL — ABNORMAL HIGH (ref 65–99)
Glucose-Capillary: 352 mg/dL — ABNORMAL HIGH (ref 65–99)
Glucose-Capillary: 358 mg/dL — ABNORMAL HIGH (ref 65–99)

## 2017-01-20 LAB — URINE CULTURE: Culture: NO GROWTH

## 2017-01-20 LAB — CBC
HCT: 29.6 % — ABNORMAL LOW (ref 39.0–52.0)
Hemoglobin: 10.4 g/dL — ABNORMAL LOW (ref 13.0–17.0)
MCH: 28.3 pg (ref 26.0–34.0)
MCHC: 35.1 g/dL (ref 30.0–36.0)
MCV: 80.7 fL (ref 78.0–100.0)
PLATELETS: 140 10*3/uL — AB (ref 150–400)
RBC: 3.67 MIL/uL — ABNORMAL LOW (ref 4.22–5.81)
RDW: 16 % — AB (ref 11.5–15.5)
WBC: 11.7 10*3/uL — ABNORMAL HIGH (ref 4.0–10.5)

## 2017-01-20 LAB — LIPASE, BLOOD: Lipase: 42 U/L (ref 11–51)

## 2017-01-20 LAB — AMYLASE: Amylase: 145 U/L — ABNORMAL HIGH (ref 28–100)

## 2017-01-20 LAB — MAGNESIUM: MAGNESIUM: 1.8 mg/dL (ref 1.7–2.4)

## 2017-01-20 LAB — PHOSPHORUS: Phosphorus: 3.3 mg/dL (ref 2.5–4.6)

## 2017-01-20 MED ORDER — SODIUM CHLORIDE 0.9 % IV SOLN
INTRAVENOUS | Status: DC
  Administered 2017-01-20: 11:00:00 via INTRAVENOUS

## 2017-01-20 MED ORDER — MAGNESIUM SULFATE 2 GM/50ML IV SOLN
2.0000 g | Freq: Once | INTRAVENOUS | Status: AC
  Administered 2017-01-20: 2 g via INTRAVENOUS
  Filled 2017-01-20: qty 50

## 2017-01-20 MED ORDER — DEXTROSE-NACL 5-0.45 % IV SOLN
INTRAVENOUS | Status: DC
  Administered 2017-01-20 – 2017-01-21 (×2): via INTRAVENOUS

## 2017-01-20 MED ORDER — POTASSIUM CHLORIDE 10 MEQ/100ML IV SOLN: 10.0000 meq | INTRAVENOUS | Status: DC

## 2017-01-20 MED ORDER — POTASSIUM CHLORIDE 10 MEQ/100ML IV SOLN
10.0000 meq | INTRAVENOUS | Status: AC
  Administered 2017-01-20 (×4): 10 meq via INTRAVENOUS
  Filled 2017-01-20 (×4): qty 100

## 2017-01-20 MED ORDER — CHLORHEXIDINE GLUCONATE CLOTH 2 % EX PADS
6.0000 | MEDICATED_PAD | Freq: Every day | CUTANEOUS | Status: DC
  Administered 2017-01-20 – 2017-01-25 (×5): 6 via TOPICAL

## 2017-01-20 MED ORDER — VANCOMYCIN HCL 10 G IV SOLR
1500.0000 mg | INTRAVENOUS | Status: DC
  Filled 2017-01-20: qty 1500

## 2017-01-20 MED ORDER — SODIUM CHLORIDE 0.9 % IV SOLN
INTRAVENOUS | Status: DC
  Administered 2017-01-20: 20:00:00 via INTRAVENOUS
  Administered 2017-01-20: 2.9 [IU]/h via INTRAVENOUS
  Filled 2017-01-20 (×2): qty 1

## 2017-01-20 NOTE — Progress Notes (Signed)
Byrum, MD gave verbal order to stop Nimbex gtt.  Gtt stopped.  MD arrived at bedside to assess.  No further orders. Will continue to monitor.

## 2017-01-20 NOTE — Progress Notes (Signed)
Nutrition Follow-up  DOCUMENTATION CODES:   Non-severe (moderate) malnutrition in context of chronic illness  INTERVENTION:  - Will monitor for ability to start TF and provide recommendations/orders at that time.   NUTRITION DIAGNOSIS:   Moderate Malnutrition related to chronic illness(DM) as evidenced by moderate muscle depletion, mild muscle depletion, mild fat depletion. -ongoing  GOAL:   Patient will meet greater than or equal to 90% of their needs -unmet/unable to meet  MONITOR:   Vent status, Weight trends, Labs, Other (Comment)(Plans concerning nutrition support)  ASSESSMENT:   51 year old man with a history of obesity, diabetes, hypertension, hyperlipidemia, obstructive sleep apnea, atrial fib/flutter with a history of prior cardioversion.  He called his son on the date of admission because he was feeling weak and unwell.  He reportedly had not been eating or drinking for the last several days prior to this.  He was brought by EMS to ED and was noted to be hypoglycemic, encephalopathic, hypotensive.  Insulin and IV fluids were initiated, but he remained hypotensive and altered.  Chest x-ray showed a right lower lobe infiltrate and he was started on antibiotics for a community-acquired versus aspiration pneumonia.  He continued to experience an overall decline and was intubated for airway protection in the ED.   12/21 Pt remains intubated with NGT to LIS and scant amount of brown output present. Pt is currently off of insulin drip, but RN reports this is going to restart shortly. Per PCCM NP notes yesterday and today, pt on ARDS protocol, goal MAP >65, and plan to hold TF at this time and consider starting once DKA resolved. Notes also state r/o pancreatitis--noted elevated serum amylase. Weight +14 lbs/6.2 kg compared to weight yesterday which is likely d/t fluid; continue to use weight from yesterday (86.4 kg) for estimation of needs. Estimated needs today are exactly the same as  yesterday.  Patient is currently intubated on ventilator support MV: 14.2 L/min Temp (24hrs), Avg:101.4 F (38.6 C), Min:98.8 F (37.1 C), Max:105.6 F (40.9 C) BP: 115/70 and MAP: 84  Medications reviewed; 50 mg Solu-cortef QID, 2 g IV Mg sulfate x1 run today, 40 mg IV Protonix BID, 10 mEq IV KCl x4 runs today, 20 mEq IV KPhos x1 run yesterday afternoon. Labs reviewed; CBGs: 146, 141, and 220 mg/dL this AM, BUN: 45 mg/dL, creatinine: 1.322.27 mg/dL, Ca: 6 mg/dL, GFR: 31 mL/min, serum amylase: 145 u/L.  IVF: D5-1/2 NS @ 75 mL/hr (306 kcal) Drips: Nimbex @ 3 mcg/kg/min, Levo @ 14 mcg/min, Fentanyl @ 150 mcg/hr, Versed @ 3 mg/hr.    12/20 - NGT to LIS with 200cc dark output; RN reports this is mainly from night shift.  - Wife at bedside and reports all information.  - Pt has been feeling unwell x1 month and kept promising family and friends to go to a doctor, but he never did.  - He is home during the day while she works and that "I can't watch him and control what he does and doesn't eat; he eats terribly."  - She indicates this means unhealthy food choices.  - It is unclear from discussion if there were any changes in eating habits or the quantity of food he was eating during the time of not feeling well.  - She reports pt weighed 260 lbs at a doctor's office ~1 year ago.  - Based on current weight, this indicates 70 lb weight loss (27% body weight) in the past ~1 year which is significant for time frame.  -  No weight in the chart since 12/08/11 when pt weighed 305 lbs.   Patient is currently intubated on ventilator support MV: 14.2 L/min Temp (24hrs), Avg:101.5 F (38.6 C), Min:94.4 F (34.7 C), Max:105.6 F (40.9 C) Propofol: none BP: 105/59 and MAP: 73  Medications; 2 g IV Mg sulfate x1 run today, 40 mEq KCl per NGT x1 dose today. IVF: D5-150 mEq sodium bicarb @ 125 mL/hr (510 kcal) Drips: Nimbex @ 5 mcg/kg/min, insulin @ 6.4 units/hr, Fentanyl @ 125 mcg/hr, Neo @ 250 mcg/hr,  Vaso @ 0.03 units/min, Levo @ 40 mcg/min, Versed @ 4 mg/hr.      Diet Order:  Diet NPO time specified  EDUCATION NEEDS:   No education needs have been identified at this time  Skin:  Skin Assessment: Skin Integrity Issues: Skin Integrity Issues:: Stage II Stage II: R ear, L coccyx  Last BM:  PTA/unknown  Height:   Ht Readings from Last 1 Encounters:  01/18/17 5\' 9"  (1.753 m)    Weight:   Wt Readings from Last 1 Encounters:  01/20/17 204 lb 2.3 oz (92.6 kg)    Ideal Body Weight:  72.73 kg  BMI:  Body mass index is 30.15 kg/m.  Estimated Nutritional Needs:   Kcal:  2693  Protein:  >/= 130 grams (1.5 grams/kg)  Fluid:  >/= 2.2 L/day      Trenton GammonJessica Chan Rosasco, MS, RD, LDN, [facility] Inpatient Clinical Dietitian Pager # [phone] After hours/weekend pager # [phone]

## 2017-01-20 NOTE — Progress Notes (Signed)
CRITICAL VALUE ALERT  Critical Value:  Calcium 5.8  Date & Time Notied:  01/20/2017 1352  Provider Notified: Byrum  Orders Received/Actions taken: No further orders

## 2017-01-20 NOTE — Progress Notes (Signed)
PULMONARY / CRITICAL CARE MEDICINE   Name: [name] MRN: [id number] DOB: 09-12-1965    ADMISSION DATE:  01/18/2017  REFERRING MD:  Dr Fayrene Fearing, ED  CHIEF COMPLAINT:  Altered MS, weakness  SUMMARY:   51 year old man with a history of obesity, diabetes, hypertension, hyperlipidemia, obstructive sleep apnea, atrial fib/flutter with a history of prior cardioversion.  He called his son on the date of admission because he was feeling weak and unwell.  He reportedly had not been eating or drinking for the last several days prior to this.  He was brought by EMS to ED and was noted to be hypoglycemic, encephalopathic, hypotensive.  Insulin and IV fluids were initiated, but he remained hypotensive and altered.  Chest x-ray showed a right lower lobe infiltrate and he was started on antibiotics for a community-acquired versus aspiration pneumonia.  He continued to experience an overall decline and was intubated for airway protection in the ED.  Pressors initiated. The patient had progressive hypoxia with a PF ratio <200 concerning for ARDS requiring paralytics / low volume ventilation.     SUBJECTIVE:  Fever improved, off neo, and vaso, weaning levo, insulin gtt d/c'd early am.Potassium repleted.    VITAL SIGNS: BP (!) 150/93 (BP Location: Right Arm)   Pulse 87   Temp 98.8 F (37.1 C) (Bladder)   Resp (!) 32   Ht 5\' 9"  (1.753 m)   Wt 204 lb 2.3 oz (92.6 kg)   SpO2 98%   BMI 30.15 kg/m   HEMODYNAMICS:    VENTILATOR SETTINGS: Vent Mode: PRVC FiO2 (%):  [40 %-60 %] 40 % Set Rate:  [32 bmp-450 bmp] 32 bmp Vt Set:  [450 mL] 450 mL PEEP:  [10 cmH20-14 cmH20] 10 cmH20 Plateau Pressure:  [22 cmH20-25 cmH20] 22 cmH20  INTAKE / OUTPUT: I/O last 3 completed shifts: In: 22145.5 [I.V.:21845.5; IV Piggyback:300] Out: 2905 [Urine:2705; Emesis/NG output:200]  PHYSICAL EXAMINATION: General: critically ill appearing male sedated and intubated on full vent support HEENT: MM pink/moist, oral ETT,  BIS monitor in place  Neuro: sedated / paralyzed on vent  CV: s1s2 rrr, no m/r/g, unifocal PVC's  PULM: even/non-labored, coarse bilaterally KD:XIPJ, non-tender, bsx4 active  Extremities: warm/dry, trace edema BLE Skin: no rashes or lesions, open area on R heel /dry (? Callous with fissure)  LABS:  BMET Recent Labs  Lab 01/19/17 1715 01/19/17 2115 01/20/17 0526  NA 140 141 138  K 3.6 3.2* 4.0  CL 111 111 106  CO2 20* 21* 19*  BUN 42* 43* 45*  CREATININE 2.06* 2.03* 2.27*  GLUCOSE 222* 150* 367*    Electrolytes Recent Labs  Lab 01/19/17 0232  01/19/17 1715 01/19/17 2115 01/20/17 0526  CALCIUM  --    < > 6.2* 5.9* 6.0*  MG 1.6*  --   --   --  1.8  PHOS 1.2*  --   --   --  3.3   < > = values in this interval not displayed.    CBC Recent Labs  Lab 01/18/17 1224 01/19/17 0232 01/20/17 0526  WBC 8.8 3.0* 11.7*  HGB 13.4 12.0* 10.4*  HCT 39.0 34.9* 29.6*  PLT 301 255 140*    Coag's No results for input(s): APTT, INR in the last 168 hours.  Sepsis Markers Recent Labs  Lab 01/18/17 2330 01/19/17 0232 01/19/17 1225  LATICACIDVEN 5.3* 5.0* 3.9*    ABG Recent Labs  Lab 01/19/17 0424 01/19/17 1210 01/20/17 0340  PHART 7.164* 7.261* 7.368  PCO2ART 55.8*  47.7 34.7  PO2ART 86.8 106 73.5*    Liver Enzymes Recent Labs  Lab 01/18/17 1224  AST 23  ALT 15*  ALKPHOS 96  BILITOT 2.7*  ALBUMIN 2.6*    Cardiac Enzymes No results for input(s): TROPONINI, PROBNP in the last 168 hours.  Glucose Recent Labs  Lab 01/19/17 2135 01/19/17 2237 01/19/17 2341 01/20/17 0038 01/20/17 0143 01/20/17 0334  GLUCAP 145* 136* 139* 146* 141* 220*    Imaging Ct Abdomen Pelvis Wo Contrast  Result Date: 01/19/2017 CLINICAL DATA:  Shortness of breath and hyperglycemia, low hemoglobin EXAM: CT ABDOMEN AND PELVIS WITHOUT CONTRAST TECHNIQUE: Multidetector CT imaging of the abdomen and pelvis was performed following the standard protocol without IV contrast.  COMPARISON:  Report 05/17/2011 FINDINGS: Lower chest: Small bilateral pleural effusions. Normal heart size. Coronary artery calcification. Small moderate pneumopericardium and small amount of pneumomediastinum, most of the air is visualized at the anterior base of the heart. Dense consolidation within the bilateral lower lobes with additional multifocal ground-glass density and nodular densities in the right middle and right upper lobes. Distal esophageal thickening. Esophageal tube is looped back upon itself and the tip is positioned at the gastric fundus. Hepatobiliary: High density probable sludge in the gallbladder. No focal hepatic abnormality or biliary dilatation Pancreas: Slight indistinct appearance of the pancreatic head and uncinate process with minimal surrounding edema. No ductal dilatation Spleen: Normal in size without focal abnormality. Adrenals/Urinary Tract: Adrenal glands are within normal limits. No hydronephrosis. Foley catheter in the bladder Stomach/Bowel: The stomach is non enlarged. No dilated small bowel. No colon wall thickening. Negative appendix. Vascular/Lymphatic: Moderate aortic atherosclerosis. No aneurysmal dilatation. Small retroperitoneal lymph nodes. Reproductive: Seminal vesicle calcification.  Normal prostate size Other: Negative for free air. Small amount of free fluid in the pelvis an adjacent to the liver. Diffuse body wall edema. Musculoskeletal: Degenerative changes. No acute or suspicious bone lesion IMPRESSION: 1. Small bilateral pleural effusions with multifocal consolidations in the bilateral lower lobes, right middle lobe and visible right upper lobe consistent with multifocal pneumonia. Some of the infiltrates appear slightly nodular and imaging follow-up to resolution is recommended. 2. Small moderate amount of pneumopericardium with small amount of pneumomediastinum, uncertain source. 3. Slightly indistinct appearance of the pancreatic head and uncinate process  with suggestion of mild edema, correlate with enzymes to exclude mild pancreatitis. 4. Esophageal tube is looped back upon itself with the tip positioned in the gastric fundus 5. Probable sludge in the gallbladder 6. Small amount of free fluid in the abdomen and pelvis Electronically Signed   By: Jasmine PangKim  Fujinaga M.D.   On: 01/19/2017 22:57    STUDIES:  CT ABD / Pelvis 12/20 >>  Small bilateral pleural effusions with multifocal consolidations in the bilateral lower lobes, right middle lobe and visible right upper lobe consistent with multifocal pneumonia. Some of the infiltrates appear slightly nodular and imaging follow-up to resolution is recommended Small moderate amount of pneumopericardium with small amount of pneumomediastinum, uncertain source. Slightly indistinct appearance of the pancreatic head and uncinate process with suggestion of mild edema, correlate with enzymes to exclude mild pancreatitis. Esophageal tube is looped back upon itself with the tip positioned in the gastric fundus Probable sludge in the gallbladder Small amount of free fluid in the abdomen and pelvis  CULTURES: Blood 12/19 >> BCID 12/19 >> staph / MRSA detected   Sputum 12/21  ANTIBIOTICS: Azithromycin 12/19 >>  Ceftriaxone 12/19 x1   Zosyn 12/19 >>  Vanc 12/20>>  SIGNIFICANT EVENTS: 12/19  Admit with HHNK, shock, RLL PNA, possible pancreatitis.  Decompensated / intubated  12/20  ARDS protocol, paralytics initiated with PF ration <200  LINES/TUBES: ETT 12/19 >>  Femoral CVC 12/19 >>   DISCUSSION: 51 y/o M with DM admitted 12/19 with history of not eating or drinking for the last several days prior to this.  He was brought by EMS to ED and was noted to be hypoglycemic, encephalopathic, hypotensive.  Insulin and IV fluids were initiated, but he remained hypotensive and altered.  Chest x-ray showed a right lower lobe infiltrate and he was started on antibiotics for a community-acquired versus  aspiration pneumonia.  He continued to experience an overall decline and was intubated for airway protection in the ED.  Pressors initiated. The patient had progressive hypoxia with a PF ratio <200 concerning for ARDS requiring paralytics / low volume ventilation.     ASSESSMENT / PLAN:  PULMONARY A: Acute Hypoxic Respiratory Failure Right lower lobe pneumonia, presumed community acquired pneumonia versus aspiration pneumonia CXR 12/21>>Bilateral lower lobe pneumonia and small pleural effusions, radiographically progressed since 24 hours earlier.   P:   PRVC 6 cc/kg  / low volume ventilation  Wean PEEP / FiO2 per ARDS protocol  Paralytics / sedation per ARDS protocol  Follow intermittent CXR  ABG prn See ID   CARDIOVASCULAR A:  Shock - hypovolemic + septic   History of atrial fibrillation/flutter off anticoagulation since 10/2016 ( per cards note) Current SR-ST with unifocal PVC's Neo and vaso off 12/21 Weaning levo 12/21 Lactate down trending and weaning off pressors + 22 L since admission P:  Wean levo, vaso, neo for MAP >65 Continue A Line monitoring  Stress dose steroids, 50 mg IV Q6 IVF as below  ICU monitoring EKG prn Consider diuresis once renal function and hemodynamics allow  RENAL A:   Acute renal failure, likely due to hypoperfusion Creatinine bumped overnight 12/21 to 2.27 Anion gap metabolic acidosis>> was resolving now widening  Hypokalemia  Hypophosphatemia  Hypomagnesemia  P:   D/c bicarb 12/20 at 2300 Trend BMP / urinary output Replace electrolytes as indicated, Mag/ K repleted 12/21 Avoid nephrotoxic agents, ensure adequate renal perfusion Pharmacy to adjust dosing of nephrotoxic medications per renal function ( Assist appreciated)  GASTROINTESTINAL A:   Blood-tinged gastric secretions from NG tube post intubation>> now bilious Acid prophylaxis Mild pancreatitis per CT GB sludge P:   NPO  PPI  Hold TF for now in setting of DKA/HHNK Trend   pancreatic enzymes  Consider TF once DKA resolves  HEMATOLOGIC A:   Chronic anticoagulation for atrial fibrillation stopped 10/2016 per cards note DVT prophylaxis P:  Hold home xarelto  Hold heparin infusion, as ?  Has most recently been off anticoagulation per cards note 9/6 Continue Heparin SQ   Trend CBC/platelets  INFECTIOUS A:   RLL community-acquired pneumonia versus aspiration pneumonia Rule Out Pancreatitis  Temp down trending P:   ABX as above  Follow cultures Follow fever curve and WBC Sputum culture 12/21  See CT ABD/pelvis results above. Fever improved >>  Mild pancreatitis , GB sludge Consider d/c vanc as renal function has worsened and ? indication  ENDOCRINE A:   Hyperosmolar nonketotic coma/DKA Insulin gtt off, but gap widening 12/21( Dextrose continued in IVF after insulin gtt off) P:   IVF as above Restart Insulin gtt  Stop D5.5 NS when Insulin gtt off next BMP at 1400  Follow glucose on BMP   NEUROLOGIC A:   Sedation for MV (  Fent/ Versed) P:   RASS goal: -5 Sedation / paralytics per ARDS protocol  Weaning paralytic 12/21  FAMILY  - Updates:  No family at bedside 12/21 am   - Inter-disciplinary family meet or Palliative Care meeting due by: 01/25/17  CC Time: 35 minutes   Bevelyn Ngo, Everlene Farrier, [facility] [place] [facility] Pgr: [phone] 01/20/2017, 8:36 AM

## 2017-01-20 NOTE — Plan of Care (Signed)
  Progressing Clinical Measurements: Ability to maintain clinical measurements within normal limits will improve 01/20/2017 0323 - Progressing by Tollie EthEarly, Coleta Grosshans E, RN Diagnostic test results will improve 01/20/2017 0323 - Progressing by Brownie Nehme, Sung AmabileSara E, RN

## 2017-01-20 NOTE — Progress Notes (Signed)
CRITICAL VALUE ALERT  Critical Value:  Ca 5.9   Date & Time Notied:  01/19/17 2300  Provider Notified: Dr. Craige CottaSood  Orders Received/Actions taken: no new orders

## 2017-01-20 NOTE — Progress Notes (Signed)
Pharmacy Antibiotic Note  [name] is a 51 y.o. male admitted on 01/18/2017 with AMS, hyperglycemia, suspected CAP, r/o pancreatitis.  Pharmacy has been consulted for Zosyn dosing and Vancomycin pending final blood cultures.  Plan:  Continue Azithromycin per MD  Continue Zosyn 3.375g IV Q8H infused over 4hrs.  Decrease to Vancomycin 1500 mg IV q48h.  Measure Vanc trough levels as needed.  Follow up renal fxn, culture results, and clinical course.  Height: 5\' 9"  (175.3 cm) Weight: 204 lb 2.3 oz (92.6 kg) IBW/kg (Calculated) : 70.7  Temp (24hrs), Avg:101.4 F (38.6 C), Min:98.8 F (37.1 C), Max:105.6 F (40.9 C)  Recent Labs  Lab 01/18/17 1224  01/18/17 1842 01/18/17 2025 01/18/17 2330 01/19/17 0232 01/19/17 0730 01/19/17 1225 01/19/17 1315 01/19/17 1715 01/19/17 2115 01/20/17 0526  WBC 8.8  --   --   --   --  3.0*  --   --   --   --   --  11.7*  CREATININE 1.94*  --   --   --   --   --  1.95*  --  2.15* 2.06* 2.03* 2.27*  LATICACIDVEN  --    < > 5.3* 4.8* 5.3* 5.0*  --  3.9*  --   --   --   --    < > = values in this interval not displayed.    Estimated Creatinine Clearance: 42.3 mL/min (A) (by C-G formula based on SCr of 2.27 mg/dL (H)).    No Known Allergies  Antimicrobials this admission: 12/19 Ceftriaxone x 1 12/19 Azithromycin >> 12/19 Zosyn >> 12/20 Vanc>>  Dose adjustments this admission: 12/21 SCr increased, vanc dose empirically adjusted  Microbiology results: 12/19 BCx: 2/3 GPC       12/19 BCID: staph species (NOT aureus), meth resistance 12/19 UCx:  12/19 Sputum:  12/19 HIV Ab: negative 12/19 MRSA PCR: negative  Thank you for allowing pharmacy to be a part of this patient's care.  Lynann Beaverhristine Zareth Rippetoe PharmD, BCPS Pager [phone] 01/20/2017 10:05 AM

## 2017-01-20 NOTE — Plan of Care (Signed)
  Progressing Clinical Measurements: Respiratory complications will improve 01/20/2017 2122 - Progressing by Tollie EthEarly, Kellee Sittner E, RN Elimination: Will not experience complications related to urinary retention 01/20/2017 2122 - Progressing by Cherye Gaertner, Sung AmabileSara E, RN

## 2017-01-21 ENCOUNTER — Inpatient Hospital Stay (HOSPITAL_COMMUNITY): Payer: BC Managed Care – PPO

## 2017-01-21 LAB — BLOOD GAS, ARTERIAL
ACID-BASE DEFICIT: 1.8 mmol/L (ref 0.0–2.0)
Acid-base deficit: 0.4 mmol/L (ref 0.0–2.0)
BICARBONATE: 22.9 mmol/L (ref 20.0–28.0)
Bicarbonate: 21.2 mmol/L (ref 20.0–28.0)
DRAWN BY: 11249
Drawn by: 295031
FIO2: 40
FIO2: 40
LHR: 20 {breaths}/min
MECHVT: 430 mL
O2 SAT: 97.4 %
O2 SAT: 95.8 %
PATIENT TEMPERATURE: 98.6
PCO2 ART: 27.8 mmHg — AB (ref 32.0–48.0)
PCO2 ART: 41.1 mmHg (ref 32.0–48.0)
PEEP/CPAP: 5 cmH2O
PEEP: 6 cmH2O
PH ART: 7.364 (ref 7.350–7.450)
PO2 ART: 82.6 mmHg — AB (ref 83.0–108.0)
Patient temperature: 98.6
RATE: 32 resp/min
VT: 450 mL
pH, Arterial: 7.495 — ABNORMAL HIGH (ref 7.350–7.450)
pO2, Arterial: 84.6 mmHg (ref 83.0–108.0)

## 2017-01-21 LAB — COMPREHENSIVE METABOLIC PANEL
ALK PHOS: 127 U/L — AB (ref 38–126)
ALT: 62 U/L (ref 17–63)
AST: 128 U/L — AB (ref 15–41)
Albumin: 1.4 g/dL — ABNORMAL LOW (ref 3.5–5.0)
Anion gap: 8 (ref 5–15)
BILIRUBIN TOTAL: 1 mg/dL (ref 0.3–1.2)
BUN: 44 mg/dL — AB (ref 6–20)
CALCIUM: 6.2 mg/dL — AB (ref 8.9–10.3)
CO2: 22 mmol/L (ref 22–32)
CREATININE: 2.2 mg/dL — AB (ref 0.61–1.24)
Chloride: 109 mmol/L (ref 101–111)
GFR calc Af Amer: 38 mL/min — ABNORMAL LOW (ref >60)
GFR, EST NON AFRICAN AMERICAN: 32 mL/min — AB (ref >60)
Glucose, Bld: 145 mg/dL — ABNORMAL HIGH (ref 65–99)
Potassium: 3.4 mmol/L — ABNORMAL LOW (ref 3.5–5.1)
Sodium: 139 mmol/L (ref 135–145)
TOTAL PROTEIN: 5 g/dL — AB (ref 6.5–8.1)

## 2017-01-21 LAB — GLUCOSE, CAPILLARY
GLUCOSE-CAPILLARY: 117 mg/dL — AB (ref 65–99)
GLUCOSE-CAPILLARY: 122 mg/dL — AB (ref 65–99)
GLUCOSE-CAPILLARY: 135 mg/dL — AB (ref 65–99)
GLUCOSE-CAPILLARY: 173 mg/dL — AB (ref 65–99)
GLUCOSE-CAPILLARY: 160 mg/dL — AB (ref 65–99)
GLUCOSE-CAPILLARY: 207 mg/dL — AB (ref 65–99)
GLUCOSE-CAPILLARY: 222 mg/dL — AB (ref 65–99)
GLUCOSE-CAPILLARY: 171 mg/dL — AB (ref 65–99)
GLUCOSE-CAPILLARY: 169 mg/dL — AB (ref 65–99)
GLUCOSE-CAPILLARY: 160 mg/dL — AB (ref 65–99)
GLUCOSE-CAPILLARY: 143 mg/dL — AB (ref 65–99)
Glucose-Capillary: 123 mg/dL — ABNORMAL HIGH (ref 65–99)
Glucose-Capillary: 126 mg/dL — ABNORMAL HIGH (ref 65–99)
Glucose-Capillary: 133 mg/dL — ABNORMAL HIGH (ref 65–99)
Glucose-Capillary: 193 mg/dL — ABNORMAL HIGH (ref 65–99)
Glucose-Capillary: 182 mg/dL — ABNORMAL HIGH (ref 65–99)
Glucose-Capillary: 166 mg/dL — ABNORMAL HIGH (ref 65–99)
Glucose-Capillary: 209 mg/dL — ABNORMAL HIGH (ref 65–99)

## 2017-01-21 LAB — CBC WITH DIFFERENTIAL/PLATELET
BASOS ABS: 0 10*3/uL (ref 0.0–0.1)
Basophils Relative: 0 %
Eosinophils Absolute: 0 10*3/uL (ref 0.0–0.7)
Eosinophils Relative: 0 %
HEMATOCRIT: 27.6 % — AB (ref 39.0–52.0)
Hemoglobin: 9.7 g/dL — ABNORMAL LOW (ref 13.0–17.0)
LYMPHS PCT: 6 %
Lymphs Abs: 0.7 10*3/uL (ref 0.7–4.0)
MCH: 28.3 pg (ref 26.0–34.0)
MCHC: 35.1 g/dL (ref 30.0–36.0)
MCV: 80.5 fL (ref 78.0–100.0)
Monocytes Absolute: 0.8 10*3/uL (ref 0.1–1.0)
Monocytes Relative: 7 %
NEUTROS ABS: 10.3 10*3/uL — AB (ref 1.7–7.7)
Neutrophils Relative %: 87 %
Platelets: 154 10*3/uL (ref 150–400)
RBC: 3.43 MIL/uL — AB (ref 4.22–5.81)
RDW: 16.3 % — ABNORMAL HIGH (ref 11.5–15.5)
WBC: 11.8 10*3/uL — AB (ref 4.0–10.5)

## 2017-01-21 LAB — CULTURE, BLOOD (ROUTINE X 2): SPECIAL REQUESTS: ADEQUATE

## 2017-01-21 LAB — LIPASE, BLOOD: Lipase: 23 U/L (ref 11–51)

## 2017-01-21 LAB — MAGNESIUM: MAGNESIUM: 2.1 mg/dL (ref 1.7–2.4)

## 2017-01-21 LAB — AMYLASE: AMYLASE: 60 U/L (ref 28–100)

## 2017-01-21 MED ORDER — DEXTROSE 10 % IV SOLN: INTRAVENOUS | Status: DC | PRN

## 2017-01-21 MED ORDER — FUROSEMIDE 10 MG/ML IJ SOLN
40.0000 mg | Freq: Two times a day (BID) | INTRAMUSCULAR | Status: AC
  Administered 2017-01-21 (×2): 40 mg via INTRAVENOUS
  Filled 2017-01-21 (×2): qty 4

## 2017-01-21 MED ORDER — JEVITY 1.2 CAL PO LIQD
1000.0000 mL | ORAL | Status: DC
  Administered 2017-01-21 – 2017-01-23 (×3): 1000 mL

## 2017-01-21 MED ORDER — INSULIN GLARGINE 100 UNIT/ML ~~LOC~~ SOLN
20.0000 [IU] | SUBCUTANEOUS | Status: DC
  Administered 2017-01-21: 20 [IU] via SUBCUTANEOUS
  Filled 2017-01-21: qty 0.2

## 2017-01-21 MED ORDER — POTASSIUM CHLORIDE 20 MEQ/15ML (10%) PO SOLN
40.0000 meq | Freq: Once | ORAL | Status: AC
  Administered 2017-01-21: 40 meq
  Filled 2017-01-21: qty 30

## 2017-01-21 MED ORDER — INSULIN ASPART 100 UNIT/ML ~~LOC~~ SOLN
0.0000 [IU] | SUBCUTANEOUS | Status: DC
  Administered 2017-01-21 (×2): 4 [IU] via SUBCUTANEOUS
  Administered 2017-01-21: 7 [IU] via SUBCUTANEOUS
  Administered 2017-01-22 (×2): 3 [IU] via SUBCUTANEOUS
  Administered 2017-01-22 (×2): 4 [IU] via SUBCUTANEOUS
  Administered 2017-01-22 – 2017-01-23 (×2): 3 [IU] via SUBCUTANEOUS
  Administered 2017-01-23: 4 [IU] via SUBCUTANEOUS
  Administered 2017-01-23: 7 [IU] via SUBCUTANEOUS
  Administered 2017-01-23 (×2): 4 [IU] via SUBCUTANEOUS
  Administered 2017-01-24: 7 [IU] via SUBCUTANEOUS
  Administered 2017-01-24 (×2): 4 [IU] via SUBCUTANEOUS
  Administered 2017-01-24 (×2): 7 [IU] via SUBCUTANEOUS
  Administered 2017-01-24 – 2017-01-25 (×2): 4 [IU] via SUBCUTANEOUS
  Administered 2017-01-25 (×2): 15 [IU] via SUBCUTANEOUS
  Administered 2017-01-25: 7 [IU] via SUBCUTANEOUS
  Administered 2017-01-25: 3 [IU] via SUBCUTANEOUS
  Administered 2017-01-26 – 2017-01-28 (×7): 4 [IU] via SUBCUTANEOUS
  Administered 2017-01-29: 3 [IU] via SUBCUTANEOUS
  Administered 2017-01-29: 4 [IU] via SUBCUTANEOUS
  Administered 2017-01-29 – 2017-01-30 (×4): 3 [IU] via SUBCUTANEOUS
  Administered 2017-01-31: 4 [IU] via SUBCUTANEOUS
  Administered 2017-01-31: 7 [IU] via SUBCUTANEOUS
  Administered 2017-01-31: 4 [IU] via SUBCUTANEOUS
  Administered 2017-02-01 – 2017-02-02 (×2): 3 [IU] via SUBCUTANEOUS
  Administered 2017-02-02 – 2017-02-03 (×2): 4 [IU] via SUBCUTANEOUS
  Administered 2017-02-03: 7 [IU] via SUBCUTANEOUS
  Administered 2017-02-03: 3 [IU] via SUBCUTANEOUS
  Administered 2017-02-04: 4 [IU] via SUBCUTANEOUS
  Administered 2017-02-04: 7 [IU] via SUBCUTANEOUS
  Administered 2017-02-04: 3 [IU] via SUBCUTANEOUS
  Administered 2017-02-04: 4 [IU] via SUBCUTANEOUS
  Administered 2017-02-05: 7 [IU] via SUBCUTANEOUS
  Administered 2017-02-05: 4 [IU] via SUBCUTANEOUS
  Administered 2017-02-05 (×2): 11 [IU] via SUBCUTANEOUS
  Administered 2017-02-06: 4 [IU] via SUBCUTANEOUS
  Administered 2017-02-06 (×2): 7 [IU] via SUBCUTANEOUS
  Administered 2017-02-06: 11 [IU] via SUBCUTANEOUS
  Administered 2017-02-07: 4 [IU] via SUBCUTANEOUS
  Administered 2017-02-07: 15 [IU] via SUBCUTANEOUS
  Administered 2017-02-07: 3 [IU] via SUBCUTANEOUS
  Administered 2017-02-07 – 2017-02-08 (×4): 4 [IU] via SUBCUTANEOUS
  Administered 2017-02-08 (×2): 7 [IU] via SUBCUTANEOUS
  Administered 2017-02-08 – 2017-02-09 (×2): 11 [IU] via SUBCUTANEOUS
  Administered 2017-02-09: 7 [IU] via SUBCUTANEOUS
  Administered 2017-02-09: 4 [IU] via SUBCUTANEOUS
  Administered 2017-02-09: 11 [IU] via SUBCUTANEOUS
  Administered 2017-02-09: 4 [IU] via SUBCUTANEOUS
  Administered 2017-02-10: 11 [IU] via SUBCUTANEOUS
  Administered 2017-02-10 (×2): 7 [IU] via SUBCUTANEOUS
  Administered 2017-02-10: 11 [IU] via SUBCUTANEOUS
  Administered 2017-02-10: 4 [IU] via SUBCUTANEOUS
  Administered 2017-02-11: 7 [IU] via SUBCUTANEOUS

## 2017-01-21 NOTE — Progress Notes (Signed)
PULMONARY / CRITICAL CARE MEDICINE   Name: [name] MRN: [id number] DOB: 10/21/1965    ADMISSION DATE:  01/18/2017  REFERRING MD:  Dr Fayrene FearingJames, ED  CHIEF COMPLAINT:  Altered MS, weakness  SUMMARY:   51 year old man with a history of obesity, diabetes, hypertension, hyperlipidemia, obstructive sleep apnea, atrial fib/flutter with a history of prior cardioversion.  He called his son on the date of admission because he was feeling weak and unwell.  He reportedly had not been eating or drinking for the last several days prior to this.  He was brought by EMS to ED and was noted to be hypoglycemic, encephalopathic, hypotensive.  Insulin and IV fluids were initiated, but he remained hypotensive and altered.  Chest x-ray showed a right lower lobe infiltrate and he was started on antibiotics for a community-acquired versus aspiration pneumonia.  He continued to experience an overall decline and was intubated for airway protection in the ED.  Pressors initiated. The patient had progressive hypoxia with a PF ratio <200 concerning for ARDS requiring paralytics / low volume ventilation.     SUBJECTIVE:   Gas exchange better on ABG from this morning Tolerated DC paralytics Fairly sedated currently Remains on norepi 4   VITAL SIGNS: BP 97/60 (BP Location: Right Arm)   Pulse 66   Temp 98.1 F (36.7 C) (Bladder)   Resp (!) 32   Ht 5\' 9"  (1.753 m)   Wt 94.8 kg (208 lb 15.9 oz)   SpO2 98%   BMI 30.86 kg/m   HEMODYNAMICS:    VENTILATOR SETTINGS: Vent Mode: PRVC FiO2 (%):  [40 %] 40 % Set Rate:  [20 bmp-32 bmp] 20 bmp Vt Set:  [430 mL-450 mL] 430 mL PEEP:  [5 cmH20-8 cmH20] 5 cmH20 Plateau Pressure:  [15 cmH20-20 cmH20] 15 cmH20  INTAKE / OUTPUT: I/O last 3 completed shifts: In: 17182.9 [I.V.:9861.6; IV Piggyback:7321.2] Out: 2830 [Urine:2680; Emesis/NG output:150]  PHYSICAL EXAMINATION: General: Critically ill, obese, no distress, quite sedated HEENT: Oropharynx clear, ET tube in  place, no lesions noted Neuro: Sedated, difficult to arouse even with stimulation CV: Regular, distant, no murmur PULM: Coarse breath sounds bilaterally, no wheezing GI: Soft, benign, positive bowel sounds Extremities: Trace lower extremity edema Skin: Fissure on right heel  LABS:  BMET Recent Labs  Lab 01/20/17 1658 01/20/17 2005 01/21/17 0415  NA 140 140 139  K 3.6 3.5 3.4*  CL 110 111 109  CO2 20* 22 22  BUN 46* 45* 44*  CREATININE 2.32* 2.25* 2.20*  GLUCOSE 256* 175* 145*    Electrolytes Recent Labs  Lab 01/19/17 0232  01/20/17 0526  01/20/17 1658 01/20/17 2005 01/21/17 0415  CALCIUM  --    < > 6.0*   < > 6.1* 6.1* 6.2*  MG 1.6*  --  1.8  --   --   --  2.1  PHOS 1.2*  --  3.3  --   --   --   --    < > = values in this interval not displayed.    CBC Recent Labs  Lab 01/19/17 0232 01/20/17 0526 01/21/17 0415  WBC 3.0* 11.7* 11.8*  HGB 12.0* 10.4* 9.7*  HCT 34.9* 29.6* 27.6*  PLT 255 140* 154    Coag's No results for input(s): APTT, INR in the last 168 hours.  Sepsis Markers Recent Labs  Lab 01/18/17 2330 01/19/17 0232 01/19/17 1225  LATICACIDVEN 5.3* 5.0* 3.9*    ABG Recent Labs  Lab 01/19/17 1210 01/20/17 0340 01/21/17  0334  PHART 7.261* 7.368 7.495*  PCO2ART 47.7 34.7 27.8*  PO2ART 106 73.5* 84.6    Liver Enzymes Recent Labs  Lab 01/18/17 1224 01/21/17 0415  AST 23 128*  ALT 15* 62  ALKPHOS 96 127*  BILITOT 2.7* 1.0  ALBUMIN 2.6* 1.4*    Cardiac Enzymes No results for input(s): TROPONINI, PROBNP in the last 168 hours.  Glucose Recent Labs  Lab 01/21/17 0417 01/21/17 0524 01/21/17 0621 01/21/17 0723 01/21/17 0734 01/21/17 0821  GLUCAP 135* 173* 160* 207* 193* 222*    Imaging Dg Chest Port 1 View  Result Date: 01/21/2017 CLINICAL DATA:  Respiratory failure EXAM: PORTABLE CHEST 1 VIEW COMPARISON:  Yesterday FINDINGS: Endotracheal tube tip at the clavicular heads. An orogastric tube reaches the stomach where it  is coiled. Haziness of the lower chest from pneumonia and small effusions based on abdominal CT yesterday. There was pneumomediastinum on that study that is not seen radiographically. IMPRESSION: 1. Stable positioning of endotracheal and orogastric tubes. 2. Bilateral lower lobe pneumonia as confirmed on abdominal CT yesterday. 3. Pneumomediastinum on previous CT, not visible radiographically. Electronically Signed   By: Marnee SpringJonathon  Watts M.D.   On: 01/21/2017 07:07    STUDIES:  CT ABD / Pelvis 12/20 >>  Small bilateral pleural effusions with multifocal consolidations in the bilateral lower lobes, right middle lobe and visible right upper lobe consistent with multifocal pneumonia. Some of the infiltrates appear slightly nodular and imaging follow-up to resolution is recommended Small moderate amount of pneumopericardium with small amount of pneumomediastinum, uncertain source. Slightly indistinct appearance of the pancreatic head and uncinate process with suggestion of mild edema, correlate with enzymes to exclude mild pancreatitis. Esophageal tube is looped back upon itself with the tip positioned in the gastric fundus Probable sludge in the gallbladder Small amount of free fluid in the abdomen and pelvis  CULTURES: Blood 12/19 >> coag negative staph BCID 12/19 >> staph / MRSE detected   Sputum 12/21 >> yeast Urine culture 12/19 >> negative  ANTIBIOTICS: Azithromycin 12/19 >> 12/22 Ceftriaxone 12/19 x1   Zosyn 12/19 >>  Vanc 12/20>> 12/22  SIGNIFICANT EVENTS: 12/19  Admit with HHNK, shock, RLL PNA, possible pancreatitis.  Decompensated / intubated  12/20  ARDS protocol, paralytics initiated with PF ratio <200  LINES/TUBES: ETT 12/19 >>  Femoral CVC 12/19 >>   DISCUSSION: 51 y/o M with DM admitted 12/19 with history of not eating or drinking for the last several days prior to this.  He was brought by EMS to ED and was noted to be hypoglycemic, encephalopathic, hypotensive.   Insulin and IV fluids were initiated, but he remained hypotensive and altered.  Chest x-ray showed a right lower lobe infiltrate and he was started on antibiotics for a community-acquired versus aspiration pneumonia.  He continued to experience an overall decline and was intubated for airway protection in the ED.  Pressors initiated. The patient had progressive hypoxia with a PF ratio <200 concerning for ARDS requiring paralytics / low volume ventilation.     ASSESSMENT / PLAN:  PULMONARY A: Acute Hypoxic Respiratory Failure Right lower lobe pneumonia, presumed community acquired pneumonia versus aspiration pneumonia ARDS.  P:   Decreased respiratory rate, adjust tidal volumes to 6 cc/kg, follow ABG PEEP and FiO2 now at goal Should be able to lighten sedation at this point Paralytics have been discontinued Antibiotics to be adjusted as below Follow intermittent chest x-ray  CARDIOVASCULAR A:  Shock - hypovolemic + septic   History of atrial fibrillation/flutter  off anticoagulation since 10/2016 ( per cards note) Current SR-ST with unifocal PVC's P:  Wean norepinephrine for MAP >65 Continue A Line monitoring  Continue stress dose hydrocortisone Attempt to minimize IV fluids given his stabilization 12/22 Plan to replace his femoral CVC if he remains on pressors   RENAL A:   Acute renal failure, likely due to hypoperfusion.  Serum creatinine plateau at 2.32 Anion gap metabolic acidosis, improved Hypokalemia  Hypophosphatemia  Hypomagnesemia  Total body volume overload due to aggressive volume resuscitation P:   Trend BMP, urine output Replace electro lites as indicated Decrease IV fluids as able, plan to initiate diuresis as soon as he can tolerate Avoid nephrotoxic agents, ensure adequate renal perfusion Pharmacy to adjust dosing of nephrotoxic medications per renal function ( Assist appreciated)  GASTROINTESTINAL A:   Blood-tinged gastric secretions from NG tube post  intubation>> now bilious Acid prophylaxis Mild pancreatitis per CT GB sludge P:   NPO  PPI  Start trickle tube feeding 12/22  HEMATOLOGIC A:   Chronic anticoagulation for atrial fibrillation stopped 10/2016 per cards note DVT prophylaxis P:  He was no longer taking home Xarelto as of 10/2016 Continue Heparin SQ   Follow CBC  INFECTIOUS A:   RLL community-acquired pneumonia versus aspiration pneumonia Coag negative staph bacteremia 1 of 2, suspect contaminant Mild pancreatitis  P:   Stop vancomycin, azithromycin Continue Zosyn Follow cultures Follow fever curve and WBC  ENDOCRINE A:   Hyperosmolar nonketotic coma/DKA P:   DC insulin drip on 12/22 Decrease IV fluids Follow BMP  NEUROLOGIC A:   Sedation for MV ( Fent/ Versed) P:   RASS goal: -1 Sedation / paralytics > begin to wean for new rascal  FAMILY  - Updates:  No family at bedside 12/22   - Inter-disciplinary family meet or Palliative Care meeting due by: 01/25/17  Independent CC time 35 minutes  Levy Pupa, MD, PhD 01/21/2017, 9:21 AM [facility] [phone] or if no answer [phone]

## 2017-01-22 ENCOUNTER — Inpatient Hospital Stay (HOSPITAL_COMMUNITY): Payer: BC Managed Care – PPO

## 2017-01-22 DIAGNOSIS — L8990 Pressure ulcer of unspecified site, unspecified stage: Secondary | ICD-10-CM

## 2017-01-22 LAB — COMPREHENSIVE METABOLIC PANEL
ALBUMIN: 1.4 g/dL — AB (ref 3.5–5.0)
ALT: 69 U/L — ABNORMAL HIGH (ref 17–63)
ANION GAP: 7 (ref 5–15)
AST: 81 U/L — ABNORMAL HIGH (ref 15–41)
Alkaline Phosphatase: 460 U/L — ABNORMAL HIGH (ref 38–126)
BUN: 51 mg/dL — ABNORMAL HIGH (ref 6–20)
CHLORIDE: 112 mmol/L — AB (ref 101–111)
CO2: 26 mmol/L (ref 22–32)
Calcium: 6.3 mg/dL — CL (ref 8.9–10.3)
Creatinine, Ser: 2.3 mg/dL — ABNORMAL HIGH (ref 0.61–1.24)
GFR calc Af Amer: 36 mL/min — ABNORMAL LOW (ref >60)
GFR calc non Af Amer: 31 mL/min — ABNORMAL LOW (ref >60)
GLUCOSE: 150 mg/dL — AB (ref 65–99)
POTASSIUM: 3.6 mmol/L (ref 3.5–5.1)
SODIUM: 145 mmol/L (ref 135–145)
Total Bilirubin: 0.7 mg/dL (ref 0.3–1.2)
Total Protein: 5.1 g/dL — ABNORMAL LOW (ref 6.5–8.1)

## 2017-01-22 LAB — GLUCOSE, CAPILLARY
GLUCOSE-CAPILLARY: 161 mg/dL — AB (ref 65–99)
GLUCOSE-CAPILLARY: 162 mg/dL — AB (ref 65–99)
Glucose-Capillary: 140 mg/dL — ABNORMAL HIGH (ref 65–99)
Glucose-Capillary: 138 mg/dL — ABNORMAL HIGH (ref 65–99)
Glucose-Capillary: 120 mg/dL — ABNORMAL HIGH (ref 65–99)

## 2017-01-22 LAB — CBC
HEMATOCRIT: 29 % — AB (ref 39.0–52.0)
HEMOGLOBIN: 9.9 g/dL — AB (ref 13.0–17.0)
MCH: 28.8 pg (ref 26.0–34.0)
MCHC: 34.1 g/dL (ref 30.0–36.0)
MCV: 84.3 fL (ref 78.0–100.0)
Platelets: 169 10*3/uL (ref 150–400)
RBC: 3.44 MIL/uL — ABNORMAL LOW (ref 4.22–5.81)
RDW: 17.1 % — ABNORMAL HIGH (ref 11.5–15.5)
WBC: 11.1 10*3/uL — ABNORMAL HIGH (ref 4.0–10.5)

## 2017-01-22 MED ORDER — PANTOPRAZOLE SODIUM 40 MG IV SOLR: 40.0000 mg | Freq: Every day | INTRAVENOUS | Status: DC

## 2017-01-22 MED ORDER — INSULIN GLARGINE 100 UNIT/ML ~~LOC~~ SOLN
20.0000 [IU] | Freq: Every day | SUBCUTANEOUS | Status: DC
  Administered 2017-01-22 – 2017-01-24 (×3): 20 [IU] via SUBCUTANEOUS
  Filled 2017-01-22 (×4): qty 0.2

## 2017-01-22 MED ORDER — MIDAZOLAM HCL 2 MG/2ML IJ SOLN
2.0000 mg | INTRAMUSCULAR | Status: DC | PRN
  Filled 2017-01-22 (×2): qty 2

## 2017-01-22 MED ORDER — FUROSEMIDE 10 MG/ML IJ SOLN
40.0000 mg | Freq: Two times a day (BID) | INTRAMUSCULAR | Status: AC
  Administered 2017-01-22 (×2): 40 mg via INTRAVENOUS
  Filled 2017-01-22 (×2): qty 4

## 2017-01-22 MED ORDER — SODIUM CHLORIDE 0.9 % IV SOLN
1.0000 g | Freq: Once | INTRAVENOUS | Status: AC
  Administered 2017-01-22: 1 g via INTRAVENOUS
  Filled 2017-01-22 (×2): qty 10

## 2017-01-22 MED ORDER — POTASSIUM CHLORIDE 20 MEQ/15ML (10%) PO SOLN
40.0000 meq | Freq: Once | ORAL | Status: AC
  Administered 2017-01-22: 40 meq
  Filled 2017-01-22: qty 30

## 2017-01-22 NOTE — Progress Notes (Signed)
CRITICAL VALUE ALERT  Critical Value: Calcium - 6.3    Date & Time Notied: 01/22/2017 0600  Provider Notified: Deterding, MD  Orders Received/Actions taken: Replacement ordered.

## 2017-01-22 NOTE — Progress Notes (Signed)
15 cc versed wasted in sink. Hazel Samshristian Smith, RN witness to waste.

## 2017-01-22 NOTE — Progress Notes (Signed)
Patient more awake. Attempted to wean. No patient effort, when I instruct patient to take a big deep breath he pulls good volumes but then drifts off back to sleep.

## 2017-01-22 NOTE — Progress Notes (Signed)
PULMONARY / CRITICAL CARE MEDICINE   Name: [name] MRN: [id number] DOB: [birth year]    ADMISSION DATE:  01/18/2017  REFERRING MD:  Dr Fayrene Fearing, ED  CHIEF COMPLAINT:  Altered MS, weakness  SUMMARY:   51 year old man with a history of obesity, diabetes, hypertension, hyperlipidemia, obstructive sleep apnea, atrial fib/flutter with a history of prior cardioversion.  He called his son on the date of admission because he was feeling weak and unwell.  He reportedly had not been eating or drinking for the last several days prior to this.  He was brought by EMS to ED and was noted to be hypoglycemic, encephalopathic, hypotensive.  Insulin and IV fluids were initiated, but he remained hypotensive and altered.  Chest x-ray showed a right lower lobe infiltrate and he was started on antibiotics for a community-acquired versus aspiration pneumonia.  He continued to experience an overall decline and was intubated for airway protection in the ED.  Pressors initiated. The patient had progressive hypoxia with a PF ratio <200 concerning for ARDS requiring paralytics / low volume ventilation.     SUBJECTIVE:   PEEP and FiO2 weaned effectively Norepinephrine at 3 Able to stop Versed this morning, weaning fentanyl   VITAL SIGNS: BP (!) 87/52   Pulse 63   Temp 98.4 F (36.9 C)   Resp 20   Ht 5\' 9"  (1.753 m)   Wt 95.2 kg (209 lb 14.1 oz) Comment: Simultaneous filing. User may not have seen previous data.  SpO2 97%   BMI 30.99 kg/m   HEMODYNAMICS:    VENTILATOR SETTINGS: Vent Mode: PRVC FiO2 (%):  [30 %-40 %] 30 % Set Rate:  [20 bmp] 20 bmp Vt Set:  [430 mL] 430 mL PEEP:  [5 cmH20] 5 cmH20 Plateau Pressure:  [14 cmH20-16 cmH20] 15 cmH20  INTAKE / OUTPUT: I/O last 3 completed shifts: In: 2592.2 [I.V.:2141.4; NG/GT:140.8; IV Piggyback:310] Out: 1600 [Urine:1450; Emesis/NG output:150]  PHYSICAL EXAMINATION: General: Ill-appearing, obese, intubated, sedated HEENT: ET tube in place,  oropharynx otherwise clear, no lesions Neuro: Sedated but will grimace and start to wake to voice and stimulation, moves his upper extremities CV: Regular, distant, no murmur PULM: Decreased at both bases, coarse bilaterally, no wheezing GI: Soft, no apparent tenderness, positive bowel sounds Extremities: Trace lower extremity edema Skin: Fissure on right heel  LABS:  BMET Recent Labs  Lab 01/20/17 2005 01/21/17 0415 01/22/17 0443  NA 140 139 145  K 3.5 3.4* 3.6  CL 111 109 112*  CO2 22 22 26   BUN 45* 44* 51*  CREATININE 2.25* 2.20* 2.30*  GLUCOSE 175* 145* 150*    Electrolytes Recent Labs  Lab 01/19/17 0232  01/20/17 0526  01/20/17 2005 01/21/17 0415 01/22/17 0443  CALCIUM  --    < > 6.0*   < > 6.1* 6.2* 6.3*  MG 1.6*  --  1.8  --   --  2.1  --   PHOS 1.2*  --  3.3  --   --   --   --    < > = values in this interval not displayed.    CBC Recent Labs  Lab 01/20/17 0526 01/21/17 0415 01/22/17 0443  WBC 11.7* 11.8* 11.1*  HGB 10.4* 9.7* 9.9*  HCT 29.6* 27.6* 29.0*  PLT 140* 154 169    Coag's No results for input(s): APTT, INR in the last 168 hours.  Sepsis Markers Recent Labs  Lab 01/18/17 2330 01/19/17 0232 01/19/17 1225  LATICACIDVEN 5.3* 5.0* 3.9*  ABG Recent Labs  Lab 01/20/17 0340 01/21/17 0334 01/21/17 1330  PHART 7.368 7.495* 7.364  PCO2ART 34.7 27.8* 41.1  PO2ART 73.5* 84.6 82.6*    Liver Enzymes Recent Labs  Lab 01/18/17 1224 01/21/17 0415 01/22/17 0443  AST 23 128* 81*  ALT 15* 62 69*  ALKPHOS 96 127* 460*  BILITOT 2.7* 1.0 0.7  ALBUMIN 2.6* 1.4* 1.4*    Cardiac Enzymes No results for input(s): TROPONINI, PROBNP in the last 168 hours.  Glucose Recent Labs  Lab 01/21/17 1234 01/21/17 1609 01/21/17 1928 01/21/17 2319 01/22/17 0309 01/22/17 0727  GLUCAP 166* 209* 160* 143* 140* 138*    Imaging Dg Chest Port 1 View  Result Date: 01/22/2017 CLINICAL DATA:  Acute respiratory failure EXAM: PORTABLE CHEST 1  VIEW COMPARISON:  Yesterday FINDINGS: Endotracheal tube tip at the clavicular heads. An orogastric tube reaches the stomach where it is coiled. Haziness of the lower chest from pneumonia on abdominal CT from 3 days ago. There is also layering pleural effusions. No pneumothorax or Kerley lines. IMPRESSION: 1. Stable positioning of endotracheal orogastric tubes. 2. Bilateral lower lobe pneumonia with layering effusions. No visible progression. Electronically Signed   By: Marnee SpringJonathon  Watts M.D.   On: 01/22/2017 07:03    STUDIES:  CT ABD / Pelvis 12/20 >>  Small bilateral pleural effusions with multifocal consolidations in the bilateral lower lobes, right middle lobe and visible right upper lobe consistent with multifocal pneumonia. Some of the infiltrates appear slightly nodular and imaging follow-up to resolution is recommended Small moderate amount of pneumopericardium with small amount of pneumomediastinum, uncertain source. Slightly indistinct appearance of the pancreatic head and uncinate process with suggestion of mild edema, correlate with enzymes to exclude mild pancreatitis. Esophageal tube is looped back upon itself with the tip positioned in the gastric fundus Probable sludge in the gallbladder Small amount of free fluid in the abdomen and pelvis  CULTURES: Blood 12/19 >> coag negative staph BCID 12/19 >> staph / MRSE detected   Sputum 12/21 >> yeast Urine culture 12/19 >> negative  ANTIBIOTICS: Azithromycin 12/19 >> 12/22 Ceftriaxone 12/19 x1   Zosyn 12/19 >>  Vanc 12/20>> 12/22  SIGNIFICANT EVENTS: 12/19  Admit with HHNK, shock, RLL PNA, possible pancreatitis.  Decompensated / intubated  12/20  ARDS protocol, paralytics initiated with PF ratio <200  LINES/TUBES: ETT 12/19 >>  Femoral CVC 12/19 >>   DISCUSSION: 51 y/o M with DM admitted 12/19 with history of not eating or drinking for the last several days prior to this.  He was brought by EMS to ED and was noted to be  hypoglycemic, encephalopathic, hypotensive.  Insulin and IV fluids were initiated, but he remained hypotensive and altered.  Chest x-ray showed a right lower lobe infiltrate and he was started on antibiotics for a community-acquired versus aspiration pneumonia.  He continued to experience an overall decline and was intubated for airway protection in the ED. septic shock.  ARDS.  Improving, continues to have bilateral pleural effusions, massive volume overload.  Metabolic status and neurological status better   ASSESSMENT / PLAN:  PULMONARY A: Acute Hypoxic Respiratory Failure Right lower lobe pneumonia, presumed community acquired pneumonia versus aspiration pneumonia ARDS.  P:   Continue 6 cc/kg PRVC PEEP and FiO2 now at goal Continue to lighten sedation as below Complete antibiotics for pneumonia as below Follow chest x-ray Diuresis as his blood pressure and renal function can tolerate  CARDIOVASCULAR A:  Shock - hypovolemic + septic   History of atrial  fibrillation/flutter off anticoagulation since 10/2016 (per cards note) Current SR-ST with unifocal PVC's P:  Continue to wean norepinephrine for MAP >65 Continue A Line monitoring, plan to remove once off pressors Continue stress dose hydrocortisone, Florinef Need to minimize IV fluids given his stabilization, massive volume overload Consider replace femoral central line if he is unable to wean off pressors, positive blood culture etc.   RENAL A:   Acute renal failure, likely due to hypoperfusion.  Appears to have plateaued Anion gap metabolic acidosis, improved Hypokalemia  Hypophosphatemia  Hypomagnesemia  Total body volume overload due to aggressive volume resuscitation P:   Follow BMP, urine output Replace electrolytes as indicated Minimize IV fluids as able, tolerated gentle diuresis 12/22; plan to repeat 12/23 and continue until serum creatinine bumps Pharmacy to adjust dosing of nephrotoxic medications per renal  function (Assist appreciated)  GASTROINTESTINAL A:   Blood-tinged gastric secretions from NG tube post intubation>> now bilious Acid prophylaxis History of chronic pancreatitis, question mild acute flare Mild transaminitis GB sludge P:   NPO  PPI, decreased to daily on 12/23 Started trickle tube feeding 12/22  HEMATOLOGIC A:   Chronic anticoagulation for atrial fibrillation stopped 10/2016 per cards note DVT prophylaxis P:  He was no longer taking home Xarelto as of 10/2016 Continue Heparin SQ   Follow CBC  INFECTIOUS A:   RLL community-acquired pneumonia versus aspiration pneumonia Coag negative staph bacteremia 1 of 2, suspect contaminant Mild pancreatitis  P:   Off vancomycin, azithromycin Continue Zosyn Follow cultures, respiratory culture not yet completed Follow fever curve, WBC  ENDOCRINE A:   Diabetes mellitus Hyperosmolar nonketotic coma/DKA > resolved P:   Off insulin drip12/22 Attempt to decrease all IV fluids Lantus as ordered  NEUROLOGIC A:   Sedation for MV ( Fent/ Versed) P:   RASS goal: -1 Continue fentanyl drip Stop Versed drip and convert to as needed pushes  FAMILY  - Updates: Prognosis and status reviewed with family at bedside 12/23  - Inter-disciplinary family meet or Palliative Care meeting due by: 01/25/17  Independent CC time 34 minutes  Levy Pupaobert Ivo Moga, MD, PhD 01/22/2017, 9:26 AM  Pulmonary and Critical Care [phone] or if no answer [phone]

## 2017-01-22 NOTE — Progress Notes (Signed)
eLink Physician-Brief Progress Note Patient Name: Gregory MasonKenneth D Freese DOB: 09/19/1965 MRN: [id number]   Date of Service  01/22/2017  HPI/Events of Note  Hypocalcemia and potassium of 3.6  eICU Interventions  Calcium and potassium replaced     Intervention Category Intermediate Interventions: Electrolyte abnormality - evaluation and management  DETERDING,ELIZABETH 01/22/2017, 5:59 AM

## 2017-01-23 ENCOUNTER — Inpatient Hospital Stay (HOSPITAL_COMMUNITY): Payer: BC Managed Care – PPO

## 2017-01-23 LAB — GLUCOSE, CAPILLARY
GLUCOSE-CAPILLARY: 126 mg/dL — AB (ref 65–99)
GLUCOSE-CAPILLARY: 151 mg/dL — AB (ref 65–99)
Glucose-Capillary: 117 mg/dL — ABNORMAL HIGH (ref 65–99)
Glucose-Capillary: 162 mg/dL — ABNORMAL HIGH (ref 65–99)
Glucose-Capillary: 166 mg/dL — ABNORMAL HIGH (ref 65–99)
Glucose-Capillary: 205 mg/dL — ABNORMAL HIGH (ref 65–99)

## 2017-01-23 LAB — CBC
HCT: 32.6 % — ABNORMAL LOW (ref 39.0–52.0)
Hemoglobin: 11 g/dL — ABNORMAL LOW (ref 13.0–17.0)
MCH: 28.4 pg (ref 26.0–34.0)
MCHC: 33.7 g/dL (ref 30.0–36.0)
MCV: 84 fL (ref 78.0–100.0)
PLATELETS: 156 10*3/uL (ref 150–400)
RBC: 3.88 MIL/uL — AB (ref 4.22–5.81)
RDW: 16.8 % — AB (ref 11.5–15.5)
WBC: 8.7 10*3/uL (ref 4.0–10.5)

## 2017-01-23 LAB — BASIC METABOLIC PANEL
ANION GAP: 10 (ref 5–15)
BUN: 53 mg/dL — ABNORMAL HIGH (ref 6–20)
CALCIUM: 6.8 mg/dL — AB (ref 8.9–10.3)
CO2: 26 mmol/L (ref 22–32)
CREATININE: 2.36 mg/dL — AB (ref 0.61–1.24)
Chloride: 111 mmol/L (ref 101–111)
GFR calc non Af Amer: 30 mL/min — ABNORMAL LOW (ref >60)
GFR, EST AFRICAN AMERICAN: 35 mL/min — AB (ref >60)
Glucose, Bld: 163 mg/dL — ABNORMAL HIGH (ref 65–99)
Potassium: 2.8 mmol/L — ABNORMAL LOW (ref 3.5–5.1)
SODIUM: 147 mmol/L — AB (ref 135–145)

## 2017-01-23 LAB — CULTURE, BLOOD (ROUTINE X 2)
Culture: NO GROWTH
Special Requests: ADEQUATE

## 2017-01-23 LAB — MAGNESIUM: MAGNESIUM: 2 mg/dL (ref 1.7–2.4)

## 2017-01-23 LAB — CULTURE, RESPIRATORY W GRAM STAIN

## 2017-01-23 LAB — CULTURE, RESPIRATORY

## 2017-01-23 MED ORDER — PANTOPRAZOLE SODIUM 40 MG PO PACK
40.0000 mg | PACK | ORAL | Status: DC
  Administered 2017-01-23 – 2017-01-25 (×3): 40 mg
  Filled 2017-01-23 (×3): qty 20

## 2017-01-23 MED ORDER — LABETALOL HCL 5 MG/ML IV SOLN: 10.0000 mg | INTRAVENOUS | Status: DC | PRN

## 2017-01-23 MED ORDER — LABETALOL HCL 5 MG/ML IV SOLN
10.0000 mg | INTRAVENOUS | Status: DC | PRN
  Administered 2017-01-23: 10 mg via INTRAVENOUS
  Filled 2017-01-23 (×5): qty 4

## 2017-01-23 MED ORDER — FENTANYL CITRATE (PF) 100 MCG/2ML IJ SOLN: 100.0000 ug | INTRAMUSCULAR | Status: DC | PRN

## 2017-01-23 MED ORDER — MIDAZOLAM HCL 2 MG/2ML IJ SOLN
2.0000 mg | INTRAMUSCULAR | Status: DC | PRN
  Filled 2017-01-23: qty 2

## 2017-01-23 MED ORDER — HYDRALAZINE HCL 20 MG/ML IJ SOLN
10.0000 mg | INTRAMUSCULAR | Status: DC | PRN
  Filled 2017-01-23: qty 1

## 2017-01-23 MED ORDER — POTASSIUM CHLORIDE 20 MEQ/15ML (10%) PO SOLN
40.0000 meq | Freq: Once | ORAL | Status: AC
  Administered 2017-01-23: 40 meq
  Filled 2017-01-23: qty 30

## 2017-01-23 NOTE — Progress Notes (Signed)
[facility] P[facility]M PROGRESS NOTE  Date of Admission: 01/18/2017  Referring Provider: Dr. Fayrene FearingJames, ER Chief Complaint: Weakness  Summary: 51 yo male with weakness and decreased appetite.  Found to have altered mental status, hyperglycemia, hypotensive with RLL PNA.  Required intubation for hypoxia with ARDS.  Past Medical History: DM, HTN, HLD, OSA, A fib s/p cardioversion  Subjective: Off sedation since 12/23.  Noted to have weakness on Lt compared to Rt.  Vital signs: BP (!) 181/101 (BP Location: Right Arm)   Pulse (!) 110   Temp 99.7 F (37.6 C) (Core)   Resp 17   Ht 5\' 9"  (1.753 m)   Wt 214 lb 15.2 oz (97.5 kg)   SpO2 95%   BMI 31.74 kg/m   Intake/Output: I/O last 3 completed shifts: In: 1064 [I.V.:330.7; Other:60; NG/GT:363.3; IV Piggyback:310] Out: 2200 [Urine:2200]  Physical Exam:  General - on vent Eyes - Lt pupil larger than Rt ENT - ETT in place Cardiac - regular, no murmur Chest - basilar rales Abd - soft, non tender Ext - legs in foot drop boots Skin - no rashes Neuro - not following commands  Discussion: 51 yo male with acute hypoxic respiratory failure and septic shock from CAP complicated by ARDS, HONK.  Noted to have Lt sided weakness, unequal pupils on 12/24.  Assessment/Plan:  Acute hypoxic respiratory failure 2nd to CAP. - pressure support wean >> not ready for extubation trial - f/u CXR - day 6 of Abx  Hypertension. History of paroxysmal atrial fibrillation. - d/c CVL, a line - prn labetalol, hydralazine for SBP > 170  Acute renal failure 2nd to ATN. Hypokalemia. - monitor renal fx - replace electrolytes  DM type II. Relative adrenal insufficiency. - SSI with lantus - d/c solu cortef, florinef 12/24  Chronic pancreatitis. - trickle tube feeds  Acute metabolic encephalopathy. - noted to have Lt sided weakness and unequal pupils 12/24 >> f/u CT head  DVT prophylaxis: SQ heparin SUP: protonix Diet: Tube feeds Goals of care: Full  code  CC time 32 minutes  Coralyn HellingVineet Clearance Chenault, MD [facility] 01/23/2017, 8:37 AM Pager:  [phone] After 3pm call: [phone]  FLOW SHEET  Cultures: Urine 12/19 >> negative Blood 12/19 >> coag neg Staph Sputum 12/21 >> yeast  Antibiotics: Zithromax 12/19 >> 12/22 Rocephin 12/19 >> 12/19 Zosyn 12/19 >>  Vancomycin 12/20 >> 12/22  Studies: CT abd/pelvis 12/20 >> small b/l effusions, b/l consolidation, pneumopericardium and pneumomediastinum, mild edema pancreatic head, GB sludge  Events: 12/19 Admit 12/20 ARDS protocol, start nimbex 12/22 Off nimbex 12/23 Off pressors, sedation  Lines/tubes: ETT 12/19 >> Femoral CVL 12/19 >>  Consults:  Resolved Problems: ARDS, Septic shock, Lactic acidosis, HONK  Labs: CMP Latest Ref Rng & Units 01/23/2017 01/22/2017 01/21/2017  Glucose 65 - 99 mg/dL 478(G163(H) 956(O150(H) 130(Q145(H)  BUN 6 - 20 mg/dL 65(H53(H) 84(O51(H) 96(E44(H)  Creatinine 0.61 - 1.24 mg/dL 9.52(W2.36(H) 4.13(K2.30(H) 4.40(N2.20(H)  Sodium 135 - 145 mmol/L 147(H) 145 139  Potassium 3.5 - 5.1 mmol/L 2.8(L) 3.6 3.4(L)  Chloride 101 - 111 mmol/L 111 112(H) 109  CO2 22 - 32 mmol/L 26 26 22   Calcium 8.9 - 10.3 mg/dL 0.2(V6.8(L) 6.3(LL) 6.2(LL)  Total Protein 6.5 - 8.1 g/dL - 5.1(L) 5.0(L)  Total Bilirubin 0.3 - 1.2 mg/dL - 0.7 1.0  Alkaline Phos 38 - 126 U/L - 460(H) 127(H)  AST 15 - 41 U/L - 81(H) 128(H)  ALT 17 - 63 U/L - 69(H) 62    CBC Latest Ref Rng &  Units 01/23/2017 01/22/2017 01/21/2017  WBC 4.0 - 10.5 K/uL 8.7 11.1(H) 11.8(H)  Hemoglobin 13.0 - 17.0 g/dL 11.0(L) 9.9(L) 9.7(L)  Hematocrit 39.0 - 52.0 % 32.6(L) 29.0(L) 27.6(L)  Platelets 150 - 400 K/uL 156 169 154    CBG (last 3)  Recent Labs    01/23/17 0029 01/23/17 0359 01/23/17 0727  GLUCAP 151* 162* 166*    Imaging: Dg Chest Port 1 View  Result Date: 01/23/2017 CLINICAL DATA:  Acute respiratory failure.  Ventilator support. EXAM: PORTABLE CHEST 1 VIEW COMPARISON:  01/22/2017 FINDINGS: Endotracheal tube tip is 3.5  cm above the carina. Nasogastric tube enters the abdomen. Persistent bilateral effusions with lower lobe atelectasis and/or pneumonia. Similar appearance to yesterday. No worsening or new finding. IMPRESSION: No change. Bilateral effusions and lower lobe atelectasis and or infiltrate. Electronically Signed   By: Paulina FusiMark  Shogry M.D.   On: 01/23/2017 06:51   Dg Chest Port 1 View  Result Date: 01/22/2017 CLINICAL DATA:  Acute respiratory failure EXAM: PORTABLE CHEST 1 VIEW COMPARISON:  Yesterday FINDINGS: Endotracheal tube tip at the clavicular heads. An orogastric tube reaches the stomach where it is coiled. Haziness of the lower chest from pneumonia on abdominal CT from 3 days ago. There is also layering pleural effusions. No pneumothorax or Kerley lines. IMPRESSION: 1. Stable positioning of endotracheal orogastric tubes. 2. Bilateral lower lobe pneumonia with layering effusions. No visible progression. Electronically Signed   By: Marnee SpringJonathon  Watts M.D.   On: 01/22/2017 07:03

## 2017-01-23 NOTE — Progress Notes (Signed)
eLink Physician-Brief Progress Note Patient Name: [name] DOB: 01/28/1966 MRN: [id number]   Date of Service  01/23/2017  HPI/Events of Note  Hypokalemia = 2.8 with borderline uop  Lab Results  Component Value Date   CREATININE 2.36 (H) 01/23/2017   CREATININE 2.30 (H) 01/22/2017   CREATININE 2.20 (H) 01/21/2017     eICU Interventions  kcl x40 meq per OG     Intervention Category Major Interventions: Electrolyte abnormality - evaluation and management  Sandrea HughsMichael Dhairya Corales 01/23/2017, 5:58 AM

## 2017-01-23 NOTE — Progress Notes (Signed)
Date: January 23, 2017 Marcelle SmilingRhonda Davis, BSN, [place]RN3, [place]CCM [phone] Chart and notes review for patient progress and needs.remains vent support for resp condition Will follow for case management and discharge needs. Next review date: [id number]

## 2017-01-24 ENCOUNTER — Inpatient Hospital Stay (HOSPITAL_COMMUNITY): Payer: BC Managed Care – PPO

## 2017-01-24 LAB — GLUCOSE, CAPILLARY
GLUCOSE-CAPILLARY: 191 mg/dL — AB (ref 65–99)
Glucose-Capillary: 93 mg/dL (ref 65–99)
Glucose-Capillary: 209 mg/dL — ABNORMAL HIGH (ref 65–99)
Glucose-Capillary: 199 mg/dL — ABNORMAL HIGH (ref 65–99)
Glucose-Capillary: 200 mg/dL — ABNORMAL HIGH (ref 65–99)
Glucose-Capillary: 210 mg/dL — ABNORMAL HIGH (ref 65–99)
Glucose-Capillary: 238 mg/dL — ABNORMAL HIGH (ref 65–99)

## 2017-01-24 LAB — COMPREHENSIVE METABOLIC PANEL
ALBUMIN: 1.7 g/dL — AB (ref 3.5–5.0)
ALK PHOS: 842 U/L — AB (ref 38–126)
ALT: 56 U/L (ref 17–63)
ANION GAP: 15 (ref 5–15)
AST: 72 U/L — AB (ref 15–41)
BUN: 64 mg/dL — AB (ref 6–20)
CALCIUM: 6.9 mg/dL — AB (ref 8.9–10.3)
CO2: 22 mmol/L (ref 22–32)
Chloride: 112 mmol/L — ABNORMAL HIGH (ref 101–111)
Creatinine, Ser: 2.96 mg/dL — ABNORMAL HIGH (ref 0.61–1.24)
GFR calc Af Amer: 26 mL/min — ABNORMAL LOW (ref >60)
GFR calc non Af Amer: 23 mL/min — ABNORMAL LOW (ref >60)
GLUCOSE: 232 mg/dL — AB (ref 65–99)
POTASSIUM: 3.4 mmol/L — AB (ref 3.5–5.1)
SODIUM: 149 mmol/L — AB (ref 135–145)
Total Bilirubin: 1.5 mg/dL — ABNORMAL HIGH (ref 0.3–1.2)
Total Protein: 6.1 g/dL — ABNORMAL LOW (ref 6.5–8.1)

## 2017-01-24 LAB — CBC
HEMATOCRIT: 37.5 % — AB (ref 39.0–52.0)
HEMOGLOBIN: 12.3 g/dL — AB (ref 13.0–17.0)
MCH: 28.3 pg (ref 26.0–34.0)
MCHC: 32.8 g/dL (ref 30.0–36.0)
MCV: 86.4 fL (ref 78.0–100.0)
Platelets: 177 10*3/uL (ref 150–400)
RBC: 4.34 MIL/uL (ref 4.22–5.81)
RDW: 16.8 % — ABNORMAL HIGH (ref 11.5–15.5)
WBC: 11 10*3/uL — ABNORMAL HIGH (ref 4.0–10.5)

## 2017-01-24 LAB — PHOSPHORUS
PHOSPHORUS: 3.7 mg/dL (ref 2.5–4.6)
PHOSPHORUS: 3.7 mg/dL (ref 2.5–4.6)

## 2017-01-24 LAB — MAGNESIUM
MAGNESIUM: 2.1 mg/dL (ref 1.7–2.4)
Magnesium: 2 mg/dL (ref 1.7–2.4)

## 2017-01-24 MED ORDER — ASPIRIN EC 81 MG PO TBEC
81.0000 mg | DELAYED_RELEASE_TABLET | Freq: Every day | ORAL | Status: DC
  Filled 2017-01-24: qty 1

## 2017-01-24 MED ORDER — VITAL HIGH PROTEIN PO LIQD
1000.0000 mL | ORAL | Status: DC
  Administered 2017-01-24: 1000 mL
  Filled 2017-01-24 (×2): qty 1000

## 2017-01-24 MED ORDER — ENOXAPARIN SODIUM 100 MG/ML ~~LOC~~ SOLN
1.0000 mg/kg | SUBCUTANEOUS | Status: DC
  Filled 2017-01-24: qty 0.95

## 2017-01-24 MED ORDER — PRO-STAT SUGAR FREE PO LIQD
30.0000 mL | Freq: Two times a day (BID) | ORAL | Status: DC
  Administered 2017-01-24 – 2017-01-25 (×3): 30 mL
  Filled 2017-01-24 (×3): qty 30

## 2017-01-24 MED ORDER — FREE WATER
200.0000 mL | Freq: Three times a day (TID) | Status: DC
  Administered 2017-01-24 – 2017-01-25 (×4): 200 mL

## 2017-01-24 NOTE — Progress Notes (Signed)
[facility] PCCM PROGRESS NOTE  Date of Admission: 01/18/2017  Referring Provider: Dr. Fayrene FearingJames, ER Chief Complaint: Weakness  Summary: 51 yo male with weakness and decreased appetite.  Found to have altered mental status, hyperglycemia, hypotensive with RLL PNA.  Required intubation for hypoxia with ARDS.  Past Medical History: DM, HTN, HLD, OSA, A fib s/p cardioversion  Subjective: Tolerating pressure support.  Vital signs: BP (!) 138/92   Pulse (!) 111   Temp 100 F (37.8 C)   Resp (!) 26   Ht 5\' 9"  (1.753 m)   Wt 209 lb 7 oz (95 kg)   SpO2 97%   BMI 30.93 kg/m   Intake/Output: I/O last 3 completed shifts: In: 670 [I.V.:120; Other:60; NG/GT:340; IV Piggyback:150] Out: 535 [Urine:535]  Physical Exam:  General - more alert Eyes - pupils reactive ENT - ETT in place Cardiac - regular, no murmur Chest - decreased BS at based b/l Abd - soft, non tender Ext - foot drop boots on legs Skin - no rashes Neuro - follows simple commands, moves right hand some  Discussion: 51 yo male with acute hypoxic respiratory failure and septic shock from CAP complicated by ARDS, HONK.  Assessment/Plan:  Acute hypoxic respiratory failure 2nd to CAP. - pressure support wean - mental status and deconditioning barriers to extubation trial - day 7/7 of Abx - likely will need trach to assist with vent weaning - f/u CXR  Hypertension. History of paroxysmal atrial fibrillation. - prn labetalol, then hydralazine for SBP > 170 - was on xarelto as outpt - will ask pharmacy to dose lovenox for A fib  Hypernatremia. Acute renal failure 2nd to ATN. Hypokalemia. - add free water - f/u BMET - replace electrolytes as needed  DM type II. - SSI with lantus  Chronic pancreatitis. - advance tube feeds  Acute metabolic encephalopathy. Weakness in setting of critical illness and neuromuscular blockade. - monitor neuro status  - limit sedation as able  DVT prophylaxis: lovenox SUP:  protonix Diet: Tube feeds Goals of care: Full code  CC time 31 minutes  Coralyn HellingVineet Nealy Karapetian, MD [facility] 01/24/2017, 9:14 AM Pager:  [phone] After 3pm call: [phone]  FLOW SHEET  Cultures: Urine 12/19 >> negative Blood 12/19 >> coag neg Staph Sputum 12/21 >> Candida Glabrata  Antibiotics: Zithromax 12/19 >> 12/22 Rocephin 12/19 >> 12/19 Zosyn 12/19 >>  Vancomycin 12/20 >> 12/22  Studies: CT abd/pelvis 12/20 >> small b/l effusions, b/l consolidation, pneumopericardium and pneumomediastinum, mild edema pancreatic head, GB sludge CT head 12/24 >> mild white matter changes  Events: 12/19 Admit 12/20 ARDS protocol, start nimbex 12/22 Off nimbex 12/23 Off pressors, sedation  Lines/tubes: ETT 12/19 >> Femoral CVL 12/19 >> 12/24  Consults:  Resolved Problems: ARDS, Septic shock, Lactic acidosis, HONK, relative adrenal insufficiency  Labs: CMP Latest Ref Rng & Units 01/24/2017 01/23/2017 01/22/2017  Glucose 65 - 99 mg/dL 308(M232(H) 578(I163(H) 696(E150(H)  BUN 6 - 20 mg/dL 95(M64(H) 84(X53(H) 32(G51(H)  Creatinine 0.61 - 1.24 mg/dL 4.01(U2.96(H) 2.72(Z2.36(H) 3.66(Y2.30(H)  Sodium 135 - 145 mmol/L 149(H) 147(H) 145  Potassium 3.5 - 5.1 mmol/L 3.4(L) 2.8(L) 3.6  Chloride 101 - 111 mmol/L 112(H) 111 112(H)  CO2 22 - 32 mmol/L 22 26 26   Calcium 8.9 - 10.3 mg/dL 6.9(L) 6.8(L) 6.3(LL)  Total Protein 6.5 - 8.1 g/dL 6.1(L) - 5.1(L)  Total Bilirubin 0.3 - 1.2 mg/dL 4.0(H1.5(H) - 0.7  Alkaline Phos 38 - 126 U/L 842(H) - 460(H)  AST 15 - 41 U/L 72(H) - 81(H)  ALT 17 - 63 U/L 56 - 69(H)    CBC Latest Ref Rng & Units 01/24/2017 01/23/2017 01/22/2017  WBC 4.0 - 10.5 K/uL 11.0(H) 8.7 11.1(H)  Hemoglobin 13.0 - 17.0 g/dL 12.3(L) 11.0(L) 9.9(L)  Hematocrit 39.0 - 52.0 % 37.5(L) 32.6(L) 29.0(L)  Platelets 150 - 400 K/uL 177 156 169    CBG (last 3)  Recent Labs    01/23/17 2358 01/24/17 0425 01/24/17 0754  GLUCAP 117* 209* 191*    Imaging: Ct Head Wo Contrast  Result Date: 01/23/2017 CLINICAL  DATA:  Altered consciousness. EXAM: CT HEAD WITHOUT CONTRAST TECHNIQUE: Contiguous axial images were obtained from the base of the skull through the vertex without intravenous contrast. COMPARISON:  None. FINDINGS: Brain: No subdural, epidural, or subarachnoid hemorrhage. Cerebellum, brainstem, and basal cisterns are normal. Ventricles and sulci are mildly prominent. Mild white matter changes identified. No acute cortical ischemia or infarct. No mass effect or midline shift. Vascular: No hyperdense vessel or unexpected calcification. Skull: Normal. Negative for fracture or focal lesion. Sinuses/Orbits: No acute finding. Other: None. IMPRESSION: 1. No acute intracranial process identified. Electronically Signed   By: Gerome Samavid  [name] III M.D   On: 01/23/2017 11:31   Dg Chest Port 1 View  Result Date: 01/24/2017 CLINICAL DATA:  Respiratory failure EXAM: PORTABLE CHEST 1 VIEW COMPARISON:  01/23/2017 FINDINGS: Support devices are stable. Layering bilateral effusions with bilateral lower lobe atelectasis or infiltrates. Heart is upper limits normal in size. No acute bony abnormality. IMPRESSION: Stable layering bilateral effusion and bilateral lower lobe opacities. Electronically Signed   By: Charlett NoseKevin  Dover M.D.   On: 01/24/2017 07:08   Dg Chest Port 1 View  Result Date: 01/23/2017 CLINICAL DATA:  Acute respiratory failure.  Ventilator support. EXAM: PORTABLE CHEST 1 VIEW COMPARISON:  01/22/2017 FINDINGS: Endotracheal tube tip is 3.5 cm above the carina. Nasogastric tube enters the abdomen. Persistent bilateral effusions with lower lobe atelectasis and/or pneumonia. Similar appearance to yesterday. No worsening or new finding. IMPRESSION: No change. Bilateral effusions and lower lobe atelectasis and or infiltrate. Electronically Signed   By: Paulina FusiMark  Shogry M.D.   On: 01/23/2017 06:51

## 2017-01-24 NOTE — Progress Notes (Signed)
Bright red drainage noted to be coming out from subglottic suctioning.  Suction amount decreased.  ETT suctioned, no evidence of said drainage, mouth care performed also with no evidence of the bright red drainage.   MD made aware of findings.  Orders provided to hold dose of Lovenox.  Lovenox held; will continue to monitor.

## 2017-01-24 NOTE — Progress Notes (Signed)
ANTICOAGULATION CONSULT NOTE - Initial Consult  Pharmacy Consult for enoxaparin Indication: atrial fibrillation  No Known Allergies  Patient Measurements: Height: 5' 9"  (175.3 cm) Weight: 209 lb 7 oz (95 kg) IBW/kg (Calculated) : 70.7 Heparin Dosing Weight:   Vital Signs: Temp: 100 F (37.8 C) (12/25 0800) Temp Source: Bladder (12/25 0715) BP: 138/92 (12/25 0733) Pulse Rate: 111 (12/25 0800)  Labs: Recent Labs    01/22/17 0443 01/23/17 0458 01/24/17 0448  HGB 9.9* 11.0* 12.3*  HCT 29.0* 32.6* 37.5*  PLT 169 156 177  CREATININE 2.30* 2.36* 2.96*    Estimated Creatinine Clearance: 32.8 mL/min (A) (by C-G formula based on SCr of 2.96 mg/dL (H)).   Medical History: Past Medical History:  Diagnosis Date  . Atrial fib/flutter, transient 09/19/2011  . Elevated cholesterol with elevated triglycerides   . Hypertension   . Kidney stones   . Pancreatitis 1999  . Pneumonia ~ 2003  . Sleep apnea    nO OFFICALLY DIAGNOSIED, SNORES LOUDLY AND WIFE HAS WITNESSED APNEA WHEN HE IS SLEEPING  . Type II diabetes mellitus (HCC)     Medications:  Scheduled:  . aspirin EC  81 mg Oral Daily  . chlorhexidine gluconate (MEDLINE KIT)  15 mL Mouth Rinse BID  . Chlorhexidine Gluconate Cloth  6 each Topical Daily  . enoxaparin (LOVENOX) injection  1 mg/kg Subcutaneous Q24H  . feeding supplement (PRO-STAT SUGAR FREE 64)  30 mL Per Tube BID  . feeding supplement (VITAL HIGH PROTEIN)  1,000 mL Per Tube Q24H  . free water  200 mL Per Tube Q8H  . insulin aspart  0-20 Units Subcutaneous Q4H  . insulin glargine  20 Units Subcutaneous Daily  . mouth rinse  15 mL Mouth Rinse QID  . pantoprazole sodium  40 mg Per Tube Q24H  . sodium chloride flush  10-40 mL Intracatheter Q12H    Assessment: Pharmacy is consulted to dose enoxaparin for 51 yo male with atrial fibrillation.   Today, 01/24/17   Scr is 2.96, which has been trending up. Normalized CrCl is  29 ml/min  Hgb 12.3, plt  177   Goal of Therapy:  Anti-Xa level 0.6-1 units/ml 4hrs after LMWH dose given Monitor platelets by anticoagulation protocol: Yes   Plan:   Enoxaparin 95 mg SQ daily ( due to normalized CrCl less than 30 ml/min  If renal function improved, may need to adjust dose to q12h dosing,  CBC ordered with AM labs  Monitor for signs and symptoms of bleeding    Royetta Asal, PharmD, BCPS Pager [phone] 01/24/2017 9:39 AM

## 2017-01-24 NOTE — Progress Notes (Signed)
Pt's bed noted to be wet; RN assessed urinary catheter and noted significant leaking at insertion site, also noted by night shift RN.  Urinary catheter removed and replaced with a 16 fr.  1700 cc of clear, yellow, urine, with a small blood clot returned immediately upon reinsertion.    Will continue to monitor.

## 2017-01-25 ENCOUNTER — Inpatient Hospital Stay (HOSPITAL_COMMUNITY): Payer: BC Managed Care – PPO

## 2017-01-25 LAB — CBC
HEMATOCRIT: 32.3 % — AB (ref 39.0–52.0)
Hemoglobin: 10.2 g/dL — ABNORMAL LOW (ref 13.0–17.0)
MCH: 27.9 pg (ref 26.0–34.0)
MCHC: 31.6 g/dL (ref 30.0–36.0)
MCV: 88.5 fL (ref 78.0–100.0)
Platelets: 197 10*3/uL (ref 150–400)
RBC: 3.65 MIL/uL — ABNORMAL LOW (ref 4.22–5.81)
RDW: 16.8 % — AB (ref 11.5–15.5)
WBC: 12.7 10*3/uL — AB (ref 4.0–10.5)

## 2017-01-25 LAB — GLUCOSE, CAPILLARY
GLUCOSE-CAPILLARY: 321 mg/dL — AB (ref 65–99)
GLUCOSE-CAPILLARY: 191 mg/dL — AB (ref 65–99)
Glucose-Capillary: 249 mg/dL — ABNORMAL HIGH (ref 65–99)
Glucose-Capillary: 272 mg/dL — ABNORMAL HIGH (ref 65–99)
Glucose-Capillary: 306 mg/dL — ABNORMAL HIGH (ref 65–99)
Glucose-Capillary: 144 mg/dL — ABNORMAL HIGH (ref 65–99)
Glucose-Capillary: 163 mg/dL — ABNORMAL HIGH (ref 65–99)

## 2017-01-25 LAB — COMPREHENSIVE METABOLIC PANEL
ALBUMIN: 1.5 g/dL — AB (ref 3.5–5.0)
ALT: 37 U/L (ref 17–63)
AST: 36 U/L (ref 15–41)
Alkaline Phosphatase: 502 U/L — ABNORMAL HIGH (ref 38–126)
Anion gap: 10 (ref 5–15)
BUN: 64 mg/dL — AB (ref 6–20)
CHLORIDE: 117 mmol/L — AB (ref 101–111)
CO2: 26 mmol/L (ref 22–32)
Calcium: 6.6 mg/dL — ABNORMAL LOW (ref 8.9–10.3)
Creatinine, Ser: 2.47 mg/dL — ABNORMAL HIGH (ref 0.61–1.24)
GFR calc Af Amer: 33 mL/min — ABNORMAL LOW (ref >60)
GFR, EST NON AFRICAN AMERICAN: 28 mL/min — AB (ref >60)
GLUCOSE: 304 mg/dL — AB (ref 65–99)
POTASSIUM: 2.8 mmol/L — AB (ref 3.5–5.1)
SODIUM: 153 mmol/L — AB (ref 135–145)
Total Bilirubin: 0.9 mg/dL (ref 0.3–1.2)
Total Protein: 5.4 g/dL — ABNORMAL LOW (ref 6.5–8.1)

## 2017-01-25 LAB — MAGNESIUM
MAGNESIUM: 1.9 mg/dL (ref 1.7–2.4)
MAGNESIUM: 1.8 mg/dL (ref 1.7–2.4)

## 2017-01-25 LAB — PHOSPHORUS
PHOSPHORUS: 4.1 mg/dL (ref 2.5–4.6)
PHOSPHORUS: 3.4 mg/dL (ref 2.5–4.6)

## 2017-01-25 MED ORDER — FREE WATER
200.0000 mL | Status: DC
  Administered 2017-01-25 – 2017-01-26 (×6): 200 mL

## 2017-01-25 MED ORDER — POTASSIUM CHLORIDE 20 MEQ/15ML (10%) PO SOLN
40.0000 meq | ORAL | Status: AC
Start: 2017-01-25 — End: 2017-01-25
  Administered 2017-01-25 (×2): 40 meq
  Filled 2017-01-25 (×2): qty 30

## 2017-01-25 MED ORDER — ENOXAPARIN SODIUM 80 MG/0.8ML ~~LOC~~ SOLN
80.0000 mg | Freq: Two times a day (BID) | SUBCUTANEOUS | Status: DC
  Administered 2017-01-25 – 2017-02-02 (×17): 80 mg via SUBCUTANEOUS
  Filled 2017-01-25 (×17): qty 0.8

## 2017-01-25 MED ORDER — VITAL AF 1.2 CAL PO LIQD
1000.0000 mL | ORAL | Status: DC
  Administered 2017-01-25: 1000 mL
  Filled 2017-01-25 (×3): qty 1000

## 2017-01-25 MED ORDER — INSULIN GLARGINE 100 UNIT/ML ~~LOC~~ SOLN
17.0000 [IU] | Freq: Two times a day (BID) | SUBCUTANEOUS | Status: DC
  Administered 2017-01-25 (×2): 17 [IU] via SUBCUTANEOUS
  Filled 2017-01-25 (×4): qty 0.17

## 2017-01-25 MED ORDER — FUROSEMIDE 10 MG/ML IJ SOLN
40.0000 mg | Freq: Once | INTRAMUSCULAR | Status: AC
  Administered 2017-01-25: 40 mg via INTRAVENOUS
  Filled 2017-01-25: qty 4

## 2017-01-25 NOTE — Progress Notes (Signed)
Patient has maintained oxygen saturations in the low 90's to high 80's; currently satting mid- high 80's consistently.  BP began dropping and having a SBP in the 70's-80's. RR RN in room, standing order to give 500 cc NS bolus, order executed.  SBP increased to 90's.   Sood, MD made aware of oxygen saturations and BP.  No further orders provided.

## 2017-01-25 NOTE — Progress Notes (Signed)
eLink Physician-Brief Progress Note Patient Name: Manley MasonKenneth D Brackins DOB: 10/26/1965 MRN: [id number]   Date of Service  01/25/2017  HPI/Events of Note  hypokalemia  eICU Interventions  Potassium replaced     Intervention Category Intermediate Interventions: Electrolyte abnormality - evaluation and management  DETERDING,ELIZABETH 01/25/2017, 6:06 AM

## 2017-01-25 NOTE — Progress Notes (Signed)
Pt has weaned for the last 3 days and has responded well to weaning. The Pt's SAT has been in the 90's for the past 2 days. The last ABG was done on 01/21/17

## 2017-01-25 NOTE — Progress Notes (Signed)
ANTICOAGULATION CONSULT NOTE  Pharmacy Consult for enoxaparin Indication: atrial fibrillation  No Known Allergies  Patient Measurements: Height: 5' 9"  (175.3 cm) Weight: 196 lb 13.9 oz (89.3 kg) IBW/kg (Calculated) : 70.7  Vital Signs: Temp: 99.1 F (37.3 C) (12/26 0800) Temp Source: Bladder (12/26 0800) BP: 143/92 (12/26 0800) Pulse Rate: 97 (12/26 0800)  Labs: Recent Labs    01/23/17 0458 01/24/17 0448 01/25/17 0506  HGB 11.0* 12.3* 10.2*  HCT 32.6* 37.5* 32.3*  PLT 156 177 197  CREATININE 2.36* 2.96* 2.47*    Estimated Creatinine Clearance: 38.2 mL/min (A) (by C-G formula based on SCr of 2.47 mg/dL (H)).   Medical History: Past Medical History:  Diagnosis Date  . Atrial fib/flutter, transient 09/19/2011  . Elevated cholesterol with elevated triglycerides   . Hypertension   . Kidney stones   . Pancreatitis 1999  . Pneumonia ~ 2003  . Sleep apnea    nO OFFICALLY DIAGNOSIED, SNORES LOUDLY AND WIFE HAS WITNESSED APNEA WHEN HE IS SLEEPING  . Type II diabetes mellitus (HCC)     Medications:  Scheduled:  . aspirin EC  81 mg Oral Daily  . chlorhexidine gluconate (MEDLINE KIT)  15 mL Mouth Rinse BID  . Chlorhexidine Gluconate Cloth  6 each Topical Daily  . enoxaparin (LOVENOX) injection  1 mg/kg Subcutaneous Q24H  . feeding supplement (PRO-STAT SUGAR FREE 64)  30 mL Per Tube BID  . feeding supplement (VITAL HIGH PROTEIN)  1,000 mL Per Tube Q24H  . free water  200 mL Per Tube Q4H  . furosemide  40 mg Intravenous Once  . insulin aspart  0-20 Units Subcutaneous Q4H  . insulin glargine  17 Units Subcutaneous BID  . mouth rinse  15 mL Mouth Rinse QID  . pantoprazole sodium  40 mg Per Tube Q24H  . potassium chloride  40 mEq Per Tube Q4H  . sodium chloride flush  10-40 mL Intracatheter Q12H    Assessment: Pharmacy is consulted to dose enoxaparin for 51 yo male with atrial fibrillation.  He has history of Afib s/p cardioversion 09/29/16 and completed course of  Xarelto 10/30/16.  He has been on SQ heparin since admission.   Today, 01/25/17   Scr improved today (2.47), est CrCl ~67m/min.  Good UOP.   CBC: Hg low & decreased from yesterday.  Pltc WNL.  RN noted bright red drainage during ETT suctioning yesterday & Lovenox dose was held. She reports this has improved today.   Goal of Therapy:  Anti-Xa level 0.6-1 units/ml 4hrs after LMWH dose given Monitor platelets by anticoagulation protocol: Yes   Plan:   Enoxaparin 80 mg SQ q12h  CBC q72h while on Lovenox  Monitor for signs and symptoms of bleeding  MNetta Cedars PharmD, BCPS 01/25/2017 8:58 AM

## 2017-01-25 NOTE — Progress Notes (Addendum)
Nutrition Follow-up  DOCUMENTATION CODES:   Non-severe (moderate) malnutrition in context of chronic illness  INTERVENTION:  - Will change TF regimen: Vital AF 1.2 @ 30 mL/hr to advance by 10 mL every 8 hours to reach goal rate of Vital AF 1.2 @ 70 mL/hr. At goal rate, this regimen will provide 2016 kcal (95% re-estimated kcal need), 126 grams of protein, and 1362 mL free water.  - Slow advancement given risk for refeeding d/t prolonged period without nutrition/adequate nutrition.  - Free water flush to continue to be per MD/NP.   Monitor magnesium, potassium, and phosphorus daily for at least 3 days, MD to replete as needed, as pt is at risk for refeeding syndrome given malnutrition and prolonged period without adequate nutrition.   NUTRITION DIAGNOSIS:   Moderate Malnutrition related to chronic illness(DM) as evidenced by moderate muscle depletion, mild muscle depletion, mild fat depletion. -ongoing  GOAL:   Patient will meet greater than or equal to 90% of their needs -unmet with current TF regimen   MONITOR:   Vent status, TF tolerance, Weight trends, Labs, Skin  ASSESSMENT:   51 year old man with a history of obesity, diabetes, hypertension, hyperlipidemia, obstructive sleep apnea, atrial fib/flutter with a history of prior cardioversion.  He called his son on the date of admission because he was feeling weak and unwell.  He reportedly had not been eating or drinking for the last several days prior to this.  He was brought by EMS to ED and was noted to be hypoglycemic, encephalopathic, hypotensive.  Insulin and IV fluids were initiated, but he remained hypotensive and altered.  Chest x-ray showed a right lower lobe infiltrate and he was started on antibiotics for a community-acquired versus aspiration pneumonia.  He continued to experience an overall decline and was intubated for airway protection in the ED.   12/26 Pt remains intubated with NGT to R nare. TF consult placed  yesterday and TF order currently in place is per TF Protocol: Vital High Protein @ 40 mL/hr with 30 mL Prostat BID. This regimen is providing 1160 kcal, 114 grams of protein, and 802 mL free water. Order also in place for 200 mL free water every 4 hours (1200 ml/day). Estimated nutrition needs updated at this time and are based on current weight (89.3 kg) as weight has been trending down. Will continue to monitor weight trends closely and update as needed.   Per CCM NP note this AM, pt may need trach with deconditioning being a barrier to extubation. Pt with chronic pancreatitis.   Patient is currently intubated on ventilator support MV: 9.6 L/min Temp (24hrs), Avg:99.8 F (37.7 C), Min:99 F (37.2 C), Max:100.6 F (38.1 C)  Medications reviewed; 40 mg IV Lasix x1 dose today, sliding scale Novolog, 17 units Lantus BID, 40 mg Protonix per NGT/day, 40 mEq KCl x2 doses today.  Labs reviewed;  CBGs: 249 and 272 mg/dL this AM, Na: 153 mmol/L, K: 2.8 mmol/L, Cl: 117 mmol/L, Ca: 6.6 mg/dL, Alk Phos elevated, BUN: 64 mg/dL, creatinine: 2.47 mg/dL, GFR: 28 mL/min.      12/21 - NGT to LIS and scant amount of brown output present.  - Pt is currently off of insulin drip, but RN reports this is going to restart shortly.  - Per PCCM NP notes yesterday and today, pt on ARDS protocol, goal MAP >65, and plan to hold TF at this time and consider starting once DKA resolved.  - Notes also state r/o pancreatitis--noted elevated serum amylase.  -  Weight +14 lbs/6.2 kg compared to weight yesterday which is likely d/t fluid; continue to use weight from yesterday (86.4 kg) for estimation of needs.   Patient is currently intubated on ventilator support MV: 14.2 L/min Temp (24hrs), Avg:101.4 F (38.6 C), Min:98.8 F (37.1 C), Max:105.6 F (40.9 C) BP: 115/70 and MAP: 84  Medications; 2 g IV Mg sulfate x1 run today, 10 mEq IV KCl x4 runs today, 20 mEq IV KPhos x1 run yesterday afternoon. Lab; serum amylase:  145 u/L.  IVF: D5-1/2 NS @ 75 mL/hr (306 kcal) Drips: Nimbex @ 3 mcg/kg/min, Levo @ 14 mcg/min, Fentanyl @ 150 mcg/hr, Versed @ 3 mg/hr.    12/20 - NGT to LIS with 200cc dark output; RN reports this is mainly from night shift.  - Wife at bedside and reports all information.  - Pt has been feeling unwell x1 month and kept promising family and friends to go to a doctor, but he never did.  - He is home during the day while she works and that "I can't watch him and control what he does and doesn't eat; he eats terribly."  - She indicates this means unhealthy food choices.  - It is unclear from discussion if there were any changes in eating habits or the quantity of food he was eating during the time of not feeling well.  - She reports pt weighed 260 lbs at a doctor's office ~1 year ago.  - Based on current weight, this indicates 70 lb weight loss (27% body weight) in the past ~1 year which is significant for time frame.  - No weight in the chart since 12/08/11 when pt weighed 305 lbs.  Patient is currently intubated on ventilator support MV:14.2L/min Temp (24hrs), Avg:101.5 F (38.6 C), Min:94.4 F (34.7 C), Max:105.6 F (40.9 C) Propofol:none BP: 105/59 and MAP: 73  Medications; 2 g IV Mg sulfate x1 run today, 40 mEq KCl per NGT x1 dose today. IVF: D5-150 mEq sodium bicarb @ 125 mL/hr (510 kcal) Drips:Nimbex @ 5 mcg/kg/min, insulin @ 6.4 units/hr, Fentanyl @ 125 mcg/hr, Neo @ 250 mcg/hr, Vaso @ 0.03 units/min, Levo @ 40 mcg/min, Versed @ 4 mg/hr.      Diet Order:  Diet NPO time specified  EDUCATION NEEDS:   No education needs have been identified at this time  Skin:  Skin Assessment: Skin Integrity Issues: Skin Integrity Issues:: Stage II, Unstageable Stage II: R ear Unstageable: full thickness to coccyx  Last BM:  12/26  Height:   Ht Readings from Last 1 Encounters:  01/23/17 _0  (1.753 m)    Weight:   Wt Readings from Last 1 Encounters:  01/25/17 196  lb 13.9 oz (89.3 kg)    Ideal Body Weight:  72.73 kg  BMI:  Body mass index is 29.07 kg/m.  Estimated Nutritional Needs:   Kcal:  2111  Protein:  107-134 grams (1.2-1.5 grams/kg)  Fluid:  >/= 2.2 L/day     Jarome Matin, MS, RD, LDN, [facility] Clinical Dietitian Pager # [phone] After hours/weekend pager # [phone]

## 2017-01-25 NOTE — Consult Note (Signed)
WOC Nurse wound consult note Reason for Consult:deep tissue injury to right ear.  Unstageable pressure injury to coccyx and stage 3 pressure injury to right ischium, all present on admission.  Wound type:pressure injury.  No medical device to right ear noted.  Silicone foam applied for protection.  Pressure Injury POA: Yes Measurement:Right ear:  1 cm x 0.3 cm intact maroon discoloration Coccyx:  8 cm x 8 cm maroon discoloration with peeling epithelium Right ischium:  4 cm x 3.2 cm x 0.2 cm  Darkened center, pink and moist Wound bed: see above Drainage (amount, consistency, odor) minimal serosanguinous  Large stool had contaminated wounds at this time.  Was cleansed and moisture barrier applied.  Stool is not loose enough for fecal diversion system at this time.  Periwound:intact Dressing procedure/placement/frequency: Cleanse right ischial wound with NS and pat gently dry.  Apply algisite alginate dressing for absorption.  Cover with silicone foam.  Change daily.   Cleanse coccyx wound with NS.  Apply algiante (Algisite dressing) to wound bed.  Will order Hydrotherapy for debridement.  Cover with silicone border foam dressing.  Silicone border foam to right ear for protection.  Will not follow at this time.  Please re-consult if needed.  Maple HudsonKaren Emie Sommerfeld RN BSN CWON Pager [phone]

## 2017-01-25 NOTE — Progress Notes (Signed)
[place] PCCM PROGRESS NOTE  Date of Admission: 01/18/2017  Referring Provider: Dr. Fayrene FearingJames, ER Chief Complaint: Weakness  Summary: 51 yo male with weakness and decreased appetite.  Found to have altered mental status, hyperglycemia, hypotensive with RLL PNA.  Required intubation for hypoxia with ARDS.  Past Medical History: DM, HTN, HLD, OSA, A fib s/p cardioversion  Subjective: No acute events overnight Weaned 12 hours yesterday, currently weaning 10/5, 40%  Vital signs: BP (!) 148/81   Pulse 96   Temp 99.5 F (37.5 C)   Resp 20   Ht 5\' 9"  (1.753 m)   Wt 196 lb 13.9 oz (89.3 kg)   SpO2 93%   BMI 29.07 kg/m   Intake/Output: I/O last 3 completed shifts: In: 1685.8 [I.V.:30; NG/GT:1455.8; IV Piggyback:200] Out: 5835 [Urine:5835]  Physical Exam:  General - alert to verbal Eyes - pupils reactive ENT - ETT in place, no bleeding Cardiac - regular, no murmur Chest - coarse, decreased BS at based b/l Abd - obese, soft, non tender, +bs Ext - foot drop boots on legs, generalized +1 edema Skin - no rashes Neuro - follows simple commands, very weak  Discussion: 51 yo male with acute hypoxic respiratory failure and septic shock from CAP complicated by ARDS, HONK.  Assessment/Plan:  Acute hypoxic respiratory failure 2nd to CAP. Bilateral pleural effusions. - pressure support wean - deconditioning barriers to extubation trial - zosyn x 7 days complete 12/26 - likely will need trach to assist with vent weaning - f/u CXR - gentle diuresis lasix x 1  Hypertension. History of paroxysmal atrial fibrillation. - prn labetalol, then hydralazine for SBP > 170 - was on xarelto as outpt - lovenox for A fib  Hypernatremia. Acute renal failure 2nd to ATN. Hypokalemia. - s/p K replete - increase free water 200ml q 4 - f/u BMET - replace electrolytes as needed  DM type II. - SSI resistant - increase lantus  Chronic pancreatitis. - advance tube feeds  Acute metabolic  encephalopathy. Weakness in setting of critical illness and neuromuscular blockade. - monitor neuro status  - limit sedation as able  DVT prophylaxis: lovenox SUP: protonix Diet: Tube feeds Goals of care: Full code  CC time 35 minutes  Posey BoyerBrooke Deke Tilghman, AGACNP-BC  Pulmonary & Critical Care Pgr: [phone] or if no answer [phone] 01/25/2017, 7:12 AM    FLOW SHEET  Cultures: Urine 12/19 >> negative Blood 12/19 >> coag neg Staph 1/2 Sputum 12/21 >> Candida Glabrata  Antibiotics: Zithromax 12/19 >> 12/22 Rocephin 12/19 >> 12/19 Zosyn 12/19 >>  Vancomycin 12/20 >> 12/22  Studies: CT abd/pelvis 12/20 >> small b/l effusions, b/l consolidation, pneumopericardium and pneumomediastinum, mild edema pancreatic head, GB sludge CT head 12/24 >> mild white matter changes  Events: 12/19 Admit 12/20 ARDS protocol, start nimbex 12/22 Off nimbex 12/23 Off pressors, sedation 12/25  Weaning  Lines/tubes: ETT 12/19 >> Femoral CVL 12/19 >> 12/24  Consults:  Resolved Problems: ARDS, Septic shock, Lactic acidosis, HONK, relative adrenal insufficiency  Labs: CMP Latest Ref Rng & Units 01/25/2017 01/24/2017 01/23/2017  Glucose 65 - 99 mg/dL 253(G304(H) 644(I232(H) 347(Q163(H)  BUN 6 - 20 mg/dL 25(Z64(H) 56(L64(H) 87(F53(H)  Creatinine 0.61 - 1.24 mg/dL 6.43(P2.47(H) 2.95(J2.96(H) 8.84(Z2.36(H)  Sodium 135 - 145 mmol/L 153(H) 149(H) 147(H)  Potassium 3.5 - 5.1 mmol/L 2.8(L) 3.4(L) 2.8(L)  Chloride 101 - 111 mmol/L 117(H) 112(H) 111  CO2 22 - 32 mmol/L 26 22 26   Calcium 8.9 - 10.3 mg/dL 6.6(L) 6.9(L) 6.8(L)  Total Protein 6.5 - 8.1  g/dL 1.6(X5.4(L) 6.1(L) -  Total Bilirubin 0.3 - 1.2 mg/dL 0.9 0.9(U1.5(H) -  Alkaline Phos 38 - 126 U/L 502(H) 842(H) -  AST 15 - 41 U/L 36 72(H) -  ALT 17 - 63 U/L 37 56 -    CBC Latest Ref Rng & Units 01/25/2017 01/24/2017 01/23/2017  WBC 4.0 - 10.5 K/uL 12.7(H) 11.0(H) 8.7  Hemoglobin 13.0 - 17.0 g/dL 10.2(L) 12.3(L) 11.0(L)  Hematocrit 39.0 - 52.0 % 32.3(L) 37.5(L) 32.6(L)  Platelets 150 -  400 K/uL 197 177 156    CBG (last 3)  Recent Labs    01/24/17 1912 01/24/17 2319 01/25/17 0348  GLUCAP 210* 238* 249*    Imaging: Ct Head Wo Contrast  Result Date: 01/23/2017 CLINICAL DATA:  Altered consciousness. EXAM: CT HEAD WITHOUT CONTRAST TECHNIQUE: Contiguous axial images were obtained from the base of the skull through the vertex without intravenous contrast. COMPARISON:  None. FINDINGS: Brain: No subdural, epidural, or subarachnoid hemorrhage. Cerebellum, brainstem, and basal cisterns are normal. Ventricles and sulci are mildly prominent. Mild white matter changes identified. No acute cortical ischemia or infarct. No mass effect or midline shift. Vascular: No hyperdense vessel or unexpected calcification. Skull: Normal. Negative for fracture or focal lesion. Sinuses/Orbits: No acute finding. Other: None. IMPRESSION: 1. No acute intracranial process identified. Electronically Signed   By: Gerome Samavid  [name] III M.D   On: 01/23/2017 11:31   Dg Chest Port 1 View  Result Date: 01/25/2017 CLINICAL DATA:  Respiratory failure. EXAM: PORTABLE CHEST 1 VIEW COMPARISON:  01/24/2017 and 01/23/2017 FINDINGS: Endotracheal tube remains in good position. NG tube is in the stomach. Haziness at both lung bases is consistent with posterior bilateral effusions. No discrete infiltrates. Heart size and vascularity are normal. Compare with the prior study there has been no change. IMPRESSION: No change since the prior study. Persistent bilateral pleural effusions. Electronically Signed   By: Francene BoyersJames  Maxwell M.D.   On: 01/25/2017 07:03   Dg Chest Port 1 View  Result Date: 01/24/2017 CLINICAL DATA:  Respiratory failure EXAM: PORTABLE CHEST 1 VIEW COMPARISON:  01/23/2017 FINDINGS: Support devices are stable. Layering bilateral effusions with bilateral lower lobe atelectasis or infiltrates. Heart is upper limits normal in size. No acute bony abnormality. IMPRESSION: Stable layering bilateral effusion and  bilateral lower lobe opacities. Electronically Signed   By: Charlett NoseKevin  Dover M.D.   On: 01/24/2017 07:08

## 2017-01-25 NOTE — Progress Notes (Signed)
Pt. Placed back to full support from weaning.

## 2017-01-25 NOTE — Progress Notes (Signed)
Date: January 25, 2017 Rhonda Davis, BSN, RN3, CCM [phone] Chart and notes review for patient progress and needs. Will follow for case management and discharge needs. Next review date: [id number] 

## 2017-01-26 LAB — CBC
HCT: 31.3 % — ABNORMAL LOW (ref 39.0–52.0)
Hemoglobin: 10 g/dL — ABNORMAL LOW (ref 13.0–17.0)
MCH: 28.7 pg (ref 26.0–34.0)
MCHC: 31.9 g/dL (ref 30.0–36.0)
MCV: 89.7 fL (ref 78.0–100.0)
PLATELETS: 246 10*3/uL (ref 150–400)
RBC: 3.49 MIL/uL — ABNORMAL LOW (ref 4.22–5.81)
RDW: 17.1 % — AB (ref 11.5–15.5)
WBC: 14.6 10*3/uL — AB (ref 4.0–10.5)

## 2017-01-26 LAB — GLUCOSE, CAPILLARY
GLUCOSE-CAPILLARY: 115 mg/dL — AB (ref 65–99)
GLUCOSE-CAPILLARY: 185 mg/dL — AB (ref 65–99)
GLUCOSE-CAPILLARY: 175 mg/dL — AB (ref 65–99)
GLUCOSE-CAPILLARY: 171 mg/dL — AB (ref 65–99)
Glucose-Capillary: 165 mg/dL — ABNORMAL HIGH (ref 65–99)
Glucose-Capillary: 62 mg/dL — ABNORMAL LOW (ref 65–99)
Glucose-Capillary: 79 mg/dL (ref 65–99)

## 2017-01-26 LAB — MAGNESIUM: MAGNESIUM: 1.8 mg/dL (ref 1.7–2.4)

## 2017-01-26 LAB — BASIC METABOLIC PANEL
ANION GAP: 7 (ref 5–15)
Anion gap: 6 (ref 5–15)
BUN: 62 mg/dL — AB (ref 6–20)
BUN: 50 mg/dL — AB (ref 6–20)
CALCIUM: 7 mg/dL — AB (ref 8.9–10.3)
CO2: 31 mmol/L (ref 22–32)
CO2: 29 mmol/L (ref 22–32)
CREATININE: 2 mg/dL — AB (ref 0.61–1.24)
Calcium: 7 mg/dL — ABNORMAL LOW (ref 8.9–10.3)
Chloride: 119 mmol/L — ABNORMAL HIGH (ref 101–111)
Chloride: 124 mmol/L — ABNORMAL HIGH (ref 101–111)
Creatinine, Ser: 2.23 mg/dL — ABNORMAL HIGH (ref 0.61–1.24)
GFR calc Af Amer: 37 mL/min — ABNORMAL LOW (ref >60)
GFR calc Af Amer: 42 mL/min — ABNORMAL LOW (ref >60)
GFR, EST NON AFRICAN AMERICAN: 32 mL/min — AB (ref >60)
GFR, EST NON AFRICAN AMERICAN: 36 mL/min — AB (ref >60)
GLUCOSE: 132 mg/dL — AB (ref 65–99)
Glucose, Bld: 164 mg/dL — ABNORMAL HIGH (ref 65–99)
Potassium: 2.5 mmol/L — CL (ref 3.5–5.1)
Potassium: 4.5 mmol/L (ref 3.5–5.1)
SODIUM: 159 mmol/L — AB (ref 135–145)
Sodium: 157 mmol/L — ABNORMAL HIGH (ref 135–145)

## 2017-01-26 MED ORDER — DEXTROSE 5 % IV SOLN
INTRAVENOUS | Status: DC
  Administered 2017-01-26 – 2017-01-28 (×5): via INTRAVENOUS
  Administered 2017-01-29: 1000 mL via INTRAVENOUS

## 2017-01-26 MED ORDER — DEXTROSE 50 % IV SOLN
INTRAVENOUS | Status: AC
  Filled 2017-01-26: qty 50

## 2017-01-26 MED ORDER — INSULIN GLARGINE 100 UNIT/ML ~~LOC~~ SOLN
10.0000 [IU] | Freq: Two times a day (BID) | SUBCUTANEOUS | Status: DC
  Administered 2017-01-26 – 2017-01-30 (×10): 10 [IU] via SUBCUTANEOUS
  Filled 2017-01-26 (×12): qty 0.1

## 2017-01-26 MED ORDER — POTASSIUM CHLORIDE 20 MEQ/15ML (10%) PO SOLN
40.0000 meq | Freq: Once | ORAL | Status: AC
  Administered 2017-01-26: 40 meq
  Filled 2017-01-26: qty 30

## 2017-01-26 MED ORDER — PANTOPRAZOLE SODIUM 40 MG IV SOLR
40.0000 mg | INTRAVENOUS | Status: DC
  Administered 2017-01-26 – 2017-01-27 (×2): 40 mg via INTRAVENOUS
  Filled 2017-01-26 (×2): qty 40

## 2017-01-26 MED ORDER — ONDANSETRON HCL 4 MG/2ML IJ SOLN
4.0000 mg | Freq: Four times a day (QID) | INTRAMUSCULAR | Status: DC | PRN
  Administered 2017-01-26: 4 mg via INTRAVENOUS
  Filled 2017-01-26: qty 2

## 2017-01-26 MED ORDER — MORPHINE SULFATE (PF) 4 MG/ML IV SOLN: 1.0000 mg | INTRAVENOUS | Status: DC | PRN

## 2017-01-26 MED ORDER — DEXTROSE 50 % IV SOLN
50.0000 mL | Freq: Once | INTRAVENOUS | Status: AC
  Administered 2017-01-26: 50 mL via INTRAVENOUS

## 2017-01-26 MED ORDER — POTASSIUM CHLORIDE 10 MEQ/100ML IV SOLN
10.0000 meq | INTRAVENOUS | Status: AC
  Administered 2017-01-26 (×6): 10 meq via INTRAVENOUS
  Filled 2017-01-26 (×4): qty 100

## 2017-01-26 MED FILL — Phenylephrine-NaCl IV Solution 10 MG/250ML-0.9%: INTRAVENOUS | Qty: 250 | Status: AC

## 2017-01-26 NOTE — Progress Notes (Signed)
[place] PCCM PROGRESS NOTE  Date of Admission: 01/18/2017  Referring Provider: Dr. Fayrene FearingJames, ER Chief Complaint: Weakness  HPI: 51 yo male with weakness and decreased appetite.  Found to have altered mental status, hyperglycemia, hypotensive with RLL PNA.  Required intubation for hypoxia with ARDS.  Past Medical History: He  has a past medical history of Atrial fib/flutter, transient (09/19/2011), Elevated cholesterol with elevated triglycerides, Hypertension, Kidney stones, Pancreatitis (1999), Pneumonia (~ 2003), Sleep apnea, and Type II diabetes mellitus (HCC).  Subjective: Doing better with pressure support.  Vital signs: BP 134/72   Pulse 81   Temp 99.5 F (37.5 C)   Resp 20   Ht 5\' 9"  (1.753 m)   Wt 192 lb 7.4 oz (87.3 kg)   SpO2 93%   BMI 28.42 kg/m   Intake/Output: I/O last 3 completed shifts: In: 2961.8 [I.V.:30; NG/GT:2731.8; IV Piggyback:200] Out: 7585 [Urine:7285; Stool:300]  Physical Exam:  General - pleasant Eyes - pupils reactive ENT - ETT, NG tubes in place Cardiac - regular, no murmur Chest - no wheeze, rales Abd - soft, non tender Ext - 1+ edema Skin - pressure wounds Rt ear, coccyx, Rt ischium Neuro - improved strength  Discussion: 51 yo male with acute hypoxic respiratory failure and septic shock from CAP complicated by ARDS, HONK.  Assessment/Plan:  Acute hypoxic respiratory failure 2nd to CAP. - pressure support wean as able >> might be able to extubate soon - f/u CXR intermittently  Hypertension. History of paroxysmal atrial fibrillation. - prn labetalol and then hydralazine for SBP > 170 - lovenox for A fib >> might be able to transition back to xarelto soon  Hypernatremia. Acute renal failure 2nd to ATN. Hypokalemia. - D5W at 40 ml/hr - f/u BMET - hold additional lasix - replace electrolytes as needed  DM type II. - SSI with lantus  Chronic pancreatitis. - tube feeds  Unstageable coccyx pressure wound. Stage 3 Rt ischial  pressure wound. Rt ear deep tissue injury. - all present prior to admission - Rt ischial wound >> NS cleanse, pat dry, apply algisite alginate dressing, cover with silicone foam, change daily - Coccyx >> NS clease, apply algisite algiante, hydrotherapy for debridement, cover with silicone border foam dressing - Rt ear >> silicone border foam  Acute metabolic encephalopathy. Weakness in setting of critical illness and neuromuscular blockade. - continues to improve - monitor neuro status  DVT prophylaxis: lovenox SUP: protonix Diet: Tube feeds Goals of care: Full code  CC time 32 minutes  Coralyn HellingVineet Etienne Mowers, MD [facility] Pulmonary/Critical Care 01/26/2017, 7:47 AM Pager:  [phone] After 3pm call: [phone]  FLOW SHEET  Cultures: Urine 12/19 >> negative Blood 12/19 >> coag neg Staph Sputum 12/21 >> Candida Glabrata  Antibiotics: Zithromax 12/19 >> 12/22 Rocephin 12/19 >> 12/19 Zosyn 12/19 >>  Vancomycin 12/20 >> 12/22  Studies: CT abd/pelvis 12/20 >> small b/l effusions, b/l consolidation, pneumopericardium and pneumomediastinum, mild edema pancreatic head, GB sludge CT head 12/24 >> mild white matter changes  Events: 12/19 Admit 12/20 ARDS protocol, start nimbex 12/22 Off nimbex 12/23 Off pressors, sedation  Lines/tubes: ETT 12/19 >> Femoral CVL 12/19 >> 12/24  Consults: 12/26 Wound care  Resolved Problems: ARDS, Septic shock, Lactic acidosis, HONK, relative adrenal insufficiency  Labs: CMP Latest Ref Rng & Units 01/26/2017 01/25/2017 01/24/2017  Glucose 65 - 99 mg/dL 478(G132(H) 956(O304(H) 130(Q232(H)  BUN 6 - 20 mg/dL 65(H62(H) 84(O64(H) 96(E64(H)  Creatinine 0.61 - 1.24 mg/dL 9.52(W2.23(H) 4.13(K2.47(H) 4.40(N2.96(H)  Sodium 135 - 145 mmol/L 157(H) 153(H)  149(H)  Potassium 3.5 - 5.1 mmol/L 2.5(LL) 2.8(L) 3.4(L)  Chloride 101 - 111 mmol/L 119(H) 117(H) 112(H)  CO2 22 - 32 mmol/L 31 26 22   Calcium 8.9 - 10.3 mg/dL 7.0(L) 6.6(L) 6.9(L)  Total Protein 6.5 - 8.1 g/dL - 5.4(L) 6.1(L)  Total Bilirubin  0.3 - 1.2 mg/dL - 0.9 8.2(N1.5(H)  Alkaline Phos 38 - 126 U/L - 502(H) 842(H)  AST 15 - 41 U/L - 36 72(H)  ALT 17 - 63 U/L - 37 56    CBC Latest Ref Rng & Units 01/26/2017 01/25/2017 01/24/2017  WBC 4.0 - 10.5 K/uL 14.6(H) 12.7(H) 11.0(H)  Hemoglobin 13.0 - 17.0 g/dL 10.0(L) 10.2(L) 12.3(L)  Hematocrit 39.0 - 52.0 % 31.3(L) 32.3(L) 37.5(L)  Platelets 150 - 400 K/uL 246 197 177    CBG (last 3)  Recent Labs    01/25/17 1950 01/25/17 2302 01/26/17 0306  GLUCAP 144* 163* 115*    Imaging: Dg Chest Port 1 View  Result Date: 01/25/2017 CLINICAL DATA:  Acute respiratory failure, hypoxia, intubated EXAM: PORTABLE CHEST 1 VIEW COMPARISON:  01/25/2017 FINDINGS: Limited portable rotated exam. Endotracheal tube at the T 2 level. NG tube coiled in the stomach. Normal heart size and vascularity. Pleural effusions present bilaterally with bibasilar atelectasis/ consolidation, worse on the right. No gross interval change. No pneumothorax. Upper lobes remain clear. IMPRESSION: Stable pleural effusions with bibasilar atelectasis / consolidation worse on the right. No significant interval change. Electronically Signed   By: Judie PetitM.  Shick M.D.   On: 01/25/2017 20:13   Dg Chest Port 1 View  Result Date: 01/25/2017 CLINICAL DATA:  Respiratory failure. EXAM: PORTABLE CHEST 1 VIEW COMPARISON:  01/24/2017 and 01/23/2017 FINDINGS: Endotracheal tube remains in good position. NG tube is in the stomach. Haziness at both lung bases is consistent with posterior bilateral effusions. No discrete infiltrates. Heart size and vascularity are normal. Compare with the prior study there has been no change. IMPRESSION: No change since the prior study. Persistent bilateral pleural effusions. Electronically Signed   By: Francene BoyersJames  Maxwell M.D.   On: 01/25/2017 07:03

## 2017-01-26 NOTE — Progress Notes (Signed)
PT Cancellation Note  Patient Details Name: [name] MRN: [id number] DOB: 01/27/1966   Cancelled Treatment:    Reason Eval/Treat Not Completed: Medical issues which prohibited therapy . Per RN, required BiPAP, concern for IV pain medication given recent respiratory distress. Recent dressing change due to BM by RN. Will check back tomorrow for medical stability.  Rada HayHill, Shanaia Sievers Elizabeth 01/26/2017, 12:08 PM  Blanchard KelchKaren Thor Nannini PT [phone]

## 2017-01-26 NOTE — Progress Notes (Signed)
eLink Physician-Brief Progress Note Patient Name: Gregory MasonKenneth D Horn DOB: 09/09/1965 MRN: [id number]   Date of Service  01/26/2017  HPI/Events of Note  K 2.5  eICU Interventions  Repleted     Intervention Category Minor Interventions: Electrolytes abnormality - evaluation and management  [name] 01/26/2017, 4:08 AM

## 2017-01-26 NOTE — Progress Notes (Signed)
Patient remaining drowsy and with weak cough. Coughing and deep breathing encouraged with incentive spirometer as well as chest bed percussion. O2 dropped to 86% on non-re-breather. RT called and patient placed on Bi Pap at 1005 initially. MD paged to update.

## 2017-01-26 NOTE — Progress Notes (Signed)
Nutrition Follow-up  DOCUMENTATION CODES:   Non-severe (moderate) malnutrition in context of chronic illness  INTERVENTION:  - Diet advancement as medically feasible.  - RD will continue to monitor for nutrition-related needs.   NUTRITION DIAGNOSIS:   Moderate Malnutrition related to chronic illness(DM) as evidenced by moderate muscle depletion, mild muscle depletion, mild fat depletion. -ongoing  GOAL:   Patient will meet greater than or equal to 90% of their needs -unmet at this time.   MONITOR:   Diet advancement, Weight trends, Labs, Skin  ASSESSMENT:   51 year old man with a history of obesity, diabetes, hypertension, hyperlipidemia, obstructive sleep apnea, atrial fib/flutter with a history of prior cardioversion.  He called his son on the date of admission because he was feeling weak and unwell.  He reportedly had not been eating or drinking for the last several days prior to this.  He was brought by EMS to ED and was noted to be hypoglycemic, encephalopathic, hypotensive.  Insulin and IV fluids were initiated, but he remained hypotensive and altered.  Chest x-ray showed a right lower lobe infiltrate and he was started on antibiotics for a community-acquired versus aspiration pneumonia.  He continued to experience an overall decline and was intubated for airway protection in the ED.   12/27 TF regimen changed by this RD yesterday to Vital AF 1.2 @ 30 mL/hr to advance by 10 mL every 8 hours to reach goal rate of Vital AF 1.2. Pt was extubated ~8:35 AM today and NGT removed at that time. TF stopped shortly before extubation. Pt remains NPO at this time. Estimated nutrition needs updated and based on current weight (87.3 kg) which is -2 kg compared to weight yesterday.   Medications reviewed; sliding scale Novolog, 10 units Lantus BID, 40 mg IV Protonix/day, 10 mEq IV KCl x6 runs today, 40 mEq KCl per NGT x1 dose earlier this AM.  Labs reviewed; CBGs: 115 and 185 mg/dL, Na: 119157  mmol/L, K: 2.5 mmol/L, Cl: 119 mmol/L. Mg and Phos were WDL last night.   IVF: D5 @ 40 mL/hr (163 kcal)    12/26 - Pt remains intubated with NGT to R nare.  - TF order currently in place is per TF Protocol: Vital High Protein @ 40 mL/hr with 30 mL Prostat BID.  - This regimen is providing 1160 kcal, 114 grams of protein, and 802 mL free water.  - Order for 200 mL free water every 4 hours (1200 ml/day).  - Estimated nutrition needs updated at this time and are based on current weight (89.3 kg) as weight has been trending down.  - Per CCM NP note this AM, pt may need trach with deconditioning being a barrier to extubation.  - Pt with chronic pancreatitis.   Patient is currently intubated on ventilator support MV: 9.6 L/min Temp (24hrs), Avg:99.8 F (37.7 C), Min:99 F (37.2 C), Max:100.6 F (38.1 C) Medication; 40 mEq KCl x2 doses today Labs; Na: 153 mmol/L, K: 2.8 mmol/L     12/21 - NGT to LIS and scant amount of brown output present.  - Pt is currently off of insulin drip, but RN reports this is going to restart shortly.  - Per PCCM NP notes yesterday and today, pt on ARDS protocol, goal MAP >65, and plan to hold TF at this time and consider starting once DKA resolved.  - Notes also state r/o pancreatitis--noted elevated serum amylase.  - Weight +14 lbs/6.2 kg compared to weight yesterday which is likely d/t fluid; continue  to use weight from yesterday (86.4 kg) for estimation of needs.   Patient is currently intubated on ventilator support MV:14.2L/min Temp (24hrs), Avg:101.4 F (38.6 C), Min:98.8 F (37.1 C), Max:105.6 F (40.9 C) BP: 115/70 and MAP: 84  Medications; 2 g IV Mg sulfate x1 run today, 10 mEq IV KCl x4 runs today, 20 mEq IV KPhos x1 run yesterday afternoon. Lab; serum amylase: 145 u/L.  IVF: D5-1/2 NS @ 75 mL/hr (306 kcal) Drips:Nimbex @ 3 mcg/kg/min, Levo @ 14 mcg/min, Fentanyl @ 150 mcg/hr, Versed @ 3 mg/hr.      Diet Order:  Diet NPO  time specified  EDUCATION NEEDS:   No education needs have been identified at this time  Skin:  Skin Assessment: Skin Integrity Issues: Skin Integrity Issues:: Stage II, Unstageable Stage II: R ear Unstageable: full thickness to coccyx  Last BM:  12/26  Height:   Ht Readings from Last 1 Encounters:  01/23/17 5\' 9"  (1.753 m)    Weight:   Wt Readings from Last 1 Encounters:  01/26/17 192 lb 7.4 oz (87.3 kg)    Ideal Body Weight:  72.73 kg  BMI:  Body mass index is 28.42 kg/m.  Estimated Nutritional Needs:   Kcal:  2010-2270 (23-26 kcal/kg)  Protein:  87-105 grams (1-1.2 grams/kg)  Fluid:  >/= 2.2 L/day      Trenton GammonJessica Emylee Decelle, MS, RD, LDN, [facility] Inpatient Clinical Dietitian Pager # [phone] After hours/weekend pager # [phone]

## 2017-01-26 NOTE — Procedures (Signed)
Extubation Procedure Note  Patient Details:   Name: Manley MasonKenneth D Amenta DOB: 07/06/1965 MRN: [id number]   Airway Documentation:     Evaluation  O2 sats: stable throughout on NRB Complications: No apparent complications Patient did tolerate procedure well. Bilateral Breath Sounds: Clear, Diminished   Yes  Renold GentaDunlap, Hadassah Rana Lawson 01/26/2017, 8:35 AM

## 2017-01-27 ENCOUNTER — Inpatient Hospital Stay (HOSPITAL_COMMUNITY): Payer: BC Managed Care – PPO

## 2017-01-27 LAB — GLUCOSE, CAPILLARY
GLUCOSE-CAPILLARY: 154 mg/dL — AB (ref 65–99)
GLUCOSE-CAPILLARY: 115 mg/dL — AB (ref 65–99)
GLUCOSE-CAPILLARY: 93 mg/dL (ref 65–99)
Glucose-Capillary: 118 mg/dL — ABNORMAL HIGH (ref 65–99)
Glucose-Capillary: 119 mg/dL — ABNORMAL HIGH (ref 65–99)

## 2017-01-27 LAB — BASIC METABOLIC PANEL
ANION GAP: 7 (ref 5–15)
Anion gap: 8 (ref 5–15)
BUN: 41 mg/dL — AB (ref 6–20)
BUN: 33 mg/dL — ABNORMAL HIGH (ref 6–20)
CALCIUM: 7 mg/dL — AB (ref 8.9–10.3)
CALCIUM: 6.9 mg/dL — AB (ref 8.9–10.3)
CHLORIDE: 118 mmol/L — AB (ref 101–111)
CO2: 30 mmol/L (ref 22–32)
CO2: 27 mmol/L (ref 22–32)
CREATININE: 1.77 mg/dL — AB (ref 0.61–1.24)
Chloride: 118 mmol/L — ABNORMAL HIGH (ref 101–111)
Creatinine, Ser: 1.53 mg/dL — ABNORMAL HIGH (ref 0.61–1.24)
GFR calc non Af Amer: 42 mL/min — ABNORMAL LOW (ref >60)
GFR, EST AFRICAN AMERICAN: 49 mL/min — AB (ref >60)
GFR, EST AFRICAN AMERICAN: 58 mL/min — AB (ref >60)
GFR, EST NON AFRICAN AMERICAN: 50 mL/min — AB (ref >60)
Glucose, Bld: 189 mg/dL — ABNORMAL HIGH (ref 65–99)
Glucose, Bld: 139 mg/dL — ABNORMAL HIGH (ref 65–99)
POTASSIUM: 3.5 mmol/L (ref 3.5–5.1)
Potassium: 2.9 mmol/L — ABNORMAL LOW (ref 3.5–5.1)
SODIUM: 156 mmol/L — AB (ref 135–145)
SODIUM: 152 mmol/L — AB (ref 135–145)

## 2017-01-27 MED ORDER — POTASSIUM CHLORIDE 10 MEQ/100ML IV SOLN
10.0000 meq | INTRAVENOUS | Status: AC
  Administered 2017-01-27 (×8): 10 meq via INTRAVENOUS
  Filled 2017-01-27 (×10): qty 100

## 2017-01-27 NOTE — Progress Notes (Signed)
Added Sterile Water to [facility] 02 system. 

## 2017-01-27 NOTE — Progress Notes (Signed)
ANTICOAGULATION CONSULT NOTE - Follow up [place] for enoxaparin Indication: atrial fibrillation  No Known Allergies  Patient Measurements: Height: 5' 9" (175.3 cm) Weight: 185 lb 6.5 oz (84.1 kg) IBW/kg (Calculated) : 70.7  Vital Signs: Temp: 99.3 F (37.4 C) (12/28 0600) BP: 134/70 (12/28 0600) Pulse Rate: 88 (12/28 0600)  Labs: Recent Labs    01/25/17 0506 01/26/17 0305 01/26/17 1642 01/27/17 0311  HGB 10.2* 10.0*  --   --   HCT 32.3* 31.3*  --   --   PLT 197 246  --   --   CREATININE 2.47* 2.23* 2.00* 1.77*    Estimated Creatinine Clearance: 48.3 mL/min (A) (by C-G formula based on SCr of 1.77 mg/dL (H)).   Medical History: Past Medical History:  Diagnosis Date  . Atrial fib/flutter, transient 09/19/2011  . Elevated cholesterol with elevated triglycerides   . Hypertension   . Kidney stones   . Pancreatitis 1999  . Pneumonia ~ 2003  . Sleep apnea    nO OFFICALLY DIAGNOSIED, SNORES LOUDLY AND WIFE HAS WITNESSED APNEA WHEN HE IS SLEEPING  . Type II diabetes mellitus (HCC)     Medications:  Scheduled:  . aspirin EC  81 mg Oral Daily  . chlorhexidine gluconate (MEDLINE KIT)  15 mL Mouth Rinse BID  . enoxaparin (LOVENOX) injection  80 mg Subcutaneous Q12H  . insulin aspart  0-20 Units Subcutaneous Q4H  . insulin glargine  10 Units Subcutaneous BID  . mouth rinse  15 mL Mouth Rinse QID  . pantoprazole (PROTONIX) IV  40 mg Intravenous Q24H    Assessment: Pharmacy is consulted to dose enoxaparin for 51 yo male with atrial fibrillation.  He was previously on Xarelto to Afib, but Per cards note from 10/06/16 he is s/p DCCV on 09/29/16 and was instructed to stop Xarelto on 10/30/16.  Today, 01/27/17   Scr improved to 1.77 with CrCl ~ 48 ml/min  CBC:  Hgb decreased to 10, Plt WNL  No bleeding or complications reported.   Goal of Therapy:  Anti-Xa level 0.6-1 units/ml 4hrs after LMWH dose given Monitor platelets by anticoagulation protocol:  Yes   Plan:   Continue Enoxaparin 80 mg SQ q12h  Monitor for signs and symptoms of bleeding  Follow up plans for oral anticoag, swallow eval, renal function.   Gretta Arab PharmD, BCPS Pager [phone] 01/27/2017 8:19 AM

## 2017-01-27 NOTE — Progress Notes (Signed)
PT refuses to utilize BiPAP at this time. Placed PT on 12 lpm [facility] HFNC- MD Sp02 goal >=92%. Sp02 currently 94%. RN aware PT is off BiPAP.

## 2017-01-27 NOTE — Progress Notes (Signed)
[name] PCCM PROGRESS NOTE  Date of Admission: 01/18/2017  Referring Provider: Dr. Fayrene FearingJames, ER Chief Complaint: Weakness  HPI: 51 yo male with weakness and decreased appetite.  Found to have altered mental status, hyperglycemia, hypotensive with RLL PNA.  Required intubation for hypoxia with ARDS.  Past Medical History: He  has a past medical history of Atrial fib/flutter, transient (09/19/2011), Elevated cholesterol with elevated triglycerides, Hypertension, Kidney stones, Pancreatitis (1999), Pneumonia (~ 2003), Sleep apnea, and Type II diabetes mellitus (HCC).  Subjective: Transitioned off of Bipap this AM.  Vital signs: BP 134/70   Pulse 88   Temp 99.3 F (37.4 C)   Resp 20   Ht 5\' 9"  (1.753 m)   Wt 185 lb 6.5 oz (84.1 kg)   SpO2 97%   BMI 27.38 kg/m   Intake/Output: I/O last 3 completed shifts: In: 2479.7 [I.V.:839.3; NG/GT:940.3; IV Piggyback:700] Out: 4280 [Urine:3980; Stool:300]  Physical Exam:  General - alert Eyes - pupils reactive ENT - no sinus tenderness, no oral exudate, no LAN Cardiac - regular, no murmur Chest - no wheeze, rales Abd - soft, non tender Ext - no edema Skin - pressure wounds Rt ear, coccyx, Rt ischium Neuro - follows commands   Discussion: 51 yo male with acute hypoxic respiratory failure and septic shock from CAP complicated by ARDS, HONK.  Assessment/Plan:  Acute hypoxic respiratory failure 2nd to CAP. - oxygen to keep SpO2 90 to 95% - Bipap prn - f/u CXR intermittently  Hypertension. History of paroxysmal atrial fibrillation. - prn labetalol, hydralazine - lovenox for A fib - resume xarelto when he can take pills  Hypernatremia. Acute renal failure 2nd to ATN. Hypokalemia. - D5W at 75 ml/hr - f/u BMET - replace potassium  DM type II. - SSI with lantus  Dysphagia. Chronic pancreatitis. - Speech to assess swallowing  Unstageable coccyx pressure wound. Stage 3 Rt ischial pressure wound. Rt ear deep tissue  injury. - all present prior to admission - Rt ischial wound >> NS cleanse, pat dry, apply algisite alginate dressing, cover with silicone foam, change daily - Coccyx >> NS clease, apply algisite algiante, hydrotherapy for debridement, cover with silicone border foam dressing - Rt ear >> silicone border foam  Acute metabolic encephalopathy. Weakness in setting of critical illness and neuromuscular blockade. Deconditioning. - continues to improve - monitor neuro status - PT assessment  DVT prophylaxis: lovenox SUP: protonix Diet: NPO Goals of care: Full code  Updated pt's wife at bedside.  Coralyn HellingVineet Parks Czajkowski, MD [facility] 01/27/2017, 11:02 AM Pager:  [phone] After 3pm call: [phone]  FLOW SHEET  Cultures: Urine 12/19 >> negative Blood 12/19 >> coag neg Staph Sputum 12/21 >> Candida Glabrata  Antibiotics: Zithromax 12/19 >> 12/22 Rocephin 12/19 >> 12/19 Zosyn 12/19 >>  Vancomycin 12/20 >> 12/22  Studies: CT abd/pelvis 12/20 >> small b/l effusions, b/l consolidation, pneumopericardium and pneumomediastinum, mild edema pancreatic head, GB sludge CT head 12/24 >> mild white matter changes  Events: 12/19 Admit 12/20 ARDS protocol, start nimbex 12/22 Off nimbex 12/23 Off pressors, sedation  Lines/tubes: ETT 12/19 >> 12/27 Femoral CVL 12/19 >> 12/24  Consults: 12/26 Wound care  Resolved Problems: ARDS, Septic shock, Lactic acidosis, HONK, relative adrenal insufficiency  Labs: CMP Latest Ref Rng & Units 01/27/2017 01/26/2017 01/26/2017  Glucose 65 - 99 mg/dL 086(V189(H) 784(O164(H) 962(X132(H)  BUN 6 - 20 mg/dL 52(W41(H) 41(L50(H) 24(M62(H)  Creatinine 0.61 - 1.24 mg/dL 0.10(U1.77(H) 7.25(D2.00(H) 6.64(Q2.23(H)  Sodium 135 - 145 mmol/L 156(H) 159(H) 157(H)  Potassium  3.5 - 5.1 mmol/L 2.9(L) 4.5 2.5(LL)  Chloride 101 - 111 mmol/L 118(H) 124(H) 119(H)  CO2 22 - 32 mmol/L 30 29 31   Calcium 8.9 - 10.3 mg/dL 7.0(L) 7.0(L) 7.0(L)  Total Protein 6.5 - 8.1 g/dL - - -  Total Bilirubin 0.3 -  1.2 mg/dL - - -  Alkaline Phos 38 - 126 U/L - - -  AST 15 - 41 U/L - - -  ALT 17 - 63 U/L - - -    CBC Latest Ref Rng & Units 01/26/2017 01/25/2017 01/24/2017  WBC 4.0 - 10.5 K/uL 14.6(H) 12.7(H) 11.0(H)  Hemoglobin 13.0 - 17.0 g/dL 10.0(L) 10.2(L) 12.3(L)  Hematocrit 39.0 - 52.0 % 31.3(L) 32.3(L) 37.5(L)  Platelets 150 - 400 K/uL 246 197 177    CBG (last 3)  Recent Labs    01/26/17 2338 01/27/17 0321 01/27/17 0717  GLUCAP 79 154* 115*    Imaging: Dg Chest Port 1 View  Result Date: 01/25/2017 CLINICAL DATA:  Acute respiratory failure, hypoxia, intubated EXAM: PORTABLE CHEST 1 VIEW COMPARISON:  01/25/2017 FINDINGS: Limited portable rotated exam. Endotracheal tube at the T 2 level. NG tube coiled in the stomach. Normal heart size and vascularity. Pleural effusions present bilaterally with bibasilar atelectasis/ consolidation, worse on the right. No gross interval change. No pneumothorax. Upper lobes remain clear. IMPRESSION: Stable pleural effusions with bibasilar atelectasis / consolidation worse on the right. No significant interval change. Electronically Signed   By: Judie PetitM.  Shick M.D.   On: 01/25/2017 20:13

## 2017-01-27 NOTE — Progress Notes (Signed)
[facility] FOR AM LAB REPLACEMENT ONLY  The patient does apply for the [facility] Electrolyte Replacment Protocol based on the criteria listed below:   1. Is GFR >/= 40 ml/min? Yes.    Patient's GFR today is 42 2. Is urine output >/= 0.5 ml/kg/hr for the last 6 hours? Yes.   Patient's UOP is 1.1 ml/kg/hr 3. Is BUN < 60 mg/dL? Yes.    Patient's BUN today is 41 4. Abnormal electrolyte K 2.9 5. Ordered repletion with: per protocol 6. If a panic level lab has been reported, has the CCM MD in charge been notified? Yes.  .   Physician:  Bradd BurnerMannam  Avrey Hyser McEachran 01/27/2017 4:33 AM

## 2017-01-27 NOTE — Progress Notes (Signed)
   01/27/17 1700 Hydrotherapy evaluation  Subjective Assessment  Subjective I want some ice chips  Patient and Family Stated Goals [facility]   Date of Onset (present on admission)  Prior Treatments nursing   Evaluation and Treatment  Evaluation and Treatment Procedures Explained to Patient/Family Yes  Evaluation and Treatment Procedures Patient unable to consent due to mental status  Pressure Injury 01/27/17 Deep Tissue Injury - Purple or maroon localized area of discolored intact skin or blood-filled blister due to damage of underlying soft tissue from pressure and/or shear. PT  Date First Assessed/Time First Assessed: 01/27/17 1600   Location: Coccyx  Staging: Deep Tissue Injury - Purple or maroon localized area of discolored intact skin or blood-filled blister due to damage of underlying soft tissue from pressure and/or she...  Dressing Type Foam;Alginate  Dressing Changed  Dressing Change Frequency Twice a day  State of Healing Early/partial granulation  Site / Wound Assessment Friable;Dry;Purple;Granulation tissue;Pink  % Wound base Red or Granulating 10%  % Wound base Yellow/Fibrinous Exudate 30%  % Wound base Black/Eschar 60%  Peri-wound Assessment Maceration;Excoriated;Bleeding  Wound Length (cm) 8.5 cm  Wound Width (cm) 7.5 cm  Wound Depth (cm) 0.3 cm  Wound Surface Area (cm^2) 63.75 cm^2  Wound Volume (cm^3) 19.12 cm^3  Margins Unattached edges (unapproximated)  Drainage Amount Minimal  Drainage Description No odor  Treatment Hydrotherapy (Pulse lavage)  Hydrotherapy  Pulsed lavage therapy - wound location coccyx and supr-gluteal fold andr side of gluteal fold  Pulsed Lavage with Suction (psi) 8 psi  Pulsed Lavage with Suction - Normal Saline Used 1000 mL  Pulsed Lavage Tip Tip with splash shield  Wound Therapy - Assess/Plan/Recommendations  Wound Therapy - Clinical Statement 51 yo male  admitted after apparantly not  moving, eating drinking for several days at his  home and developed pressure injury. Admitted with respiratory distress requiring ventilator, NRmask/ BiPAP. the patient is very weak. Currently on NRB . the patient is unable to communicate effectively and is noted to be SOB. SaO2 saturation >94 % throughout. The patient will benefit from Hydrotherapy wound treatment to increase healing and allow mobility.  Wound Therapy - Functional Problem List the patient has grossly 3/5 strngth on the right U/Le and 2/5 on the left  Factors Delaying/Impairing Wound Healing Incontinence;Immobility;Multiple medical problems  Hydrotherapy Plan Debridement;Dressing change;Patient/family education;Pulsatile lavage with suction  Wound Therapy - Frequency 6X / week  Wound Therapy - Current Recommendations PT;OT  Wound Therapy - Follow Up Recommendations Skilled nursing facility  Wound Plan PLS and dressing change  Wound Therapy Goals - Improve the function of patient's integumentary system by progressing the wound(s) through the phases of wound healing by:  Decrease Necrotic Tissue to 50  Decrease Necrotic Tissue - Progress Goal set today  Increase Granulation Tissue to 50  Increase Granulation Tissue - Progress Progressing toward goal  Goals/treatment plan/discharge plan were made with and agreed upon by patient/family No, Patient unable to participate in goals/treatment/discharge plan and family unavailable  Time For Goal Achievement 2 weeks  Wound Therapy - Potential for Goals Seymour BarsFair  Momodou Consiglio PT [phone]

## 2017-01-27 NOTE — Progress Notes (Signed)
0030 Pt awallow assessed. He does well with ice but has a cough with any liquids, will notify md. Pt has been on high flow Bode since 2000 on 01/26/17, he is tolerating well. Pt did well during his bath and dressing changes without any discomfort or breathing issues. Pt tolerated everything well at this time.

## 2017-01-27 NOTE — Evaluation (Addendum)
SLP Cancellation Note  Patient Details Name: [name] MRN: [id number] DOB: 06/28/1965   Cancelled treatment:       Reason Eval/Treat Not Completed: Other (comment);Medical issues which prohibited therapy(pt desating to 80s when moved into chair from bed despite waiting approximately 15 minutes to return to 90s, pt transferred back to icu, will reattempt MBS next date and will phone RN at [phone] before to assure readiness)   [name], Kerrilynn Derenzo [name] 01/27/2017, 3:04 PM  Donavan Burnetamara Everlean Bucher, MS [facility] SLP [phone]

## 2017-01-27 NOTE — Evaluation (Signed)
Clinical/Bedside Swallow Evaluation Patient Details  Name: [name] MRN: [id number] Date of Birth: 10/03/1965  Today's Date: 01/27/2017 Time: SLP Start Time (ACUTE ONLY): 1210 SLP Stop Time (ACUTE ONLY): 1230 SLP Time Calculation (min) (ACUTE ONLY): 20 min  Past Medical History:  Past Medical History:  Diagnosis Date  . Atrial fib/flutter, transient 09/19/2011  . Elevated cholesterol with elevated triglycerides   . Hypertension   . Kidney stones   . Pancreatitis 1999  . Pneumonia ~ 2003  . Sleep apnea    nO OFFICALLY DIAGNOSIED, SNORES LOUDLY AND WIFE HAS WITNESSED APNEA WHEN HE IS SLEEPING  . Type II diabetes mellitus (HCC)    Past Surgical History:  Past Surgical History:  Procedure Laterality Date  . CARDIOVERSION  09/30/2011   Procedure: CARDIOVERSION;  Surgeon: Wendall StadePeter C Nishan, MD;  [facility];  Service: Cardiovascular;  Laterality: N/A;  . EYE SURGERY    . PARS PLANA VITRECTOMY  12/08/2011   left  . PARS PLANA VITRECTOMY  12/08/2011   Procedure: PARS PLANA VITRECTOMY WITH 25 GAUGE;  Surgeon: Sherrie GeorgeJohn D Matthews, MD;  [facility];  Service: Ophthalmology;  Laterality: Left;  . TEE WITHOUT CARDIOVERSION  09/30/2011   Procedure: TRANSESOPHAGEAL ECHOCARDIOGRAM (TEE);  Surgeon: Wendall StadePeter C Nishan, MD;  [facility];  Service: Cardiovascular;  Laterality: N/A;  kristine/ebp/beverly ( or scheduling)/ time rescheduled from 1300 to 1200 talked ( mary)   HPI:  51 [facility] with weakness, decreased appetite and respiratory difficulites.  Pt required intubation from 12/19-12/27.  PMH + for HONK, pancreatitis, sleep apnea,   Swallow evaluation ordered.    Assessment / Plan / Recommendation Clinical Impression  Pt presents with symptoms concerning for acute pharyngeal dysphagia and possible aspiration.  Pt's voice is wet and gurgly and can't clear with cued coughing - ? effort.  Pt given po trials of ice chips, nectar thick juice and applesauce.  Subtle  symptoms of possible aspiration/penetration c/b throat clearing, cough.  Also concerns for pharyngolaryngeal sensation due to prolonged intubation.  Recommend npo x ice chips and MBS.  MBS planned for approximately 1400 today.   SLP Visit Diagnosis: Dysphagia, pharyngeal phase (R13.13)    Aspiration Risk  Severe aspiration risk;Risk for inadequate nutrition/hydration    Diet Recommendation NPO;Ice chips PRN after oral care     n/a   Other  Recommendations   MBS  Follow up Recommendations        Frequency and Duration     TBD       Prognosis   n/a     Swallow Study   General Date of Onset: 01/27/17 HPI: 51 [facility] with weakness, decreased appetite and respiratory difficulites.  Pt required intubation from 12/19-12/27.  PMH + for HONK, pancreatitis, sleep apnea,   Swallow evaluation ordered.  Type of Study: Bedside Swallow Evaluation Diet Prior to this Study: NPO Temperature Spikes Noted: No Respiratory Status: Nasal cannula History of Recent Intubation: No Behavior/Cognition: Alert;Cooperative;Pleasant mood Oral Cavity Assessment: Within Functional Limits Oral Care Completed by SLP: No Oral Cavity - Dentition: Adequate natural dentition Vision: Functional for self-feeding Self-Feeding Abilities: Able to feed self Patient Positioning: Upright in bed Baseline Vocal Quality: Low vocal intensity;Breathy;Suspected CN X (Vagus) involvement Volitional Cough: Weak;Congested;Wet Volitional Swallow: Able to elicit(with effort)    Oral/Motor/Sensory Function Overall Oral Motor/Sensory Function: Generalized oral weakness   Ice Chips Ice chips: Impaired Presentation: Spoon Oral Phase Impairments: Reduced lingual movement/coordination Oral Phase Functional Implications: Prolonged  oral transit Pharyngeal Phase Impairments: Suspected delayed Swallow;Wet Vocal Quality;Multiple swallows;Throat Clearing - Immediate   Thin Liquid Thin Liquid: Not tested    Nectar Thick Nectar  Thick Liquid: Impaired Presentation: Cup Oral Phase Impairments: Reduced lingual movement/coordination;Reduced labial seal Oral phase functional implications: Prolonged oral transit Pharyngeal Phase Impairments: Suspected delayed Swallow;Multiple swallows;Throat Clearing - Delayed   Honey Thick Honey Thick Liquid: Not tested   Puree Puree: Within functional limits Other Comments: suspect delayed swallow   Solid   GO   Solid: Not tested        [name], [name] 01/27/2017,12:41 PM   Gregory Burnetamara Dashawna Delbridge, MS [facility] SLP [phone]

## 2017-01-28 LAB — BASIC METABOLIC PANEL
ANION GAP: 5 (ref 5–15)
Anion gap: 9 (ref 5–15)
BUN: 30 mg/dL — AB (ref 6–20)
BUN: 22 mg/dL — ABNORMAL HIGH (ref 6–20)
CHLORIDE: 116 mmol/L — AB (ref 101–111)
CO2: 29 mmol/L (ref 22–32)
CO2: 26 mmol/L (ref 22–32)
CREATININE: 1.24 mg/dL (ref 0.61–1.24)
Calcium: 6.9 mg/dL — ABNORMAL LOW (ref 8.9–10.3)
Calcium: 6.9 mg/dL — ABNORMAL LOW (ref 8.9–10.3)
Chloride: 121 mmol/L — ABNORMAL HIGH (ref 101–111)
Creatinine, Ser: 1.46 mg/dL — ABNORMAL HIGH (ref 0.61–1.24)
GFR calc non Af Amer: >60 mL/min (ref >60)
GFR, EST NON AFRICAN AMERICAN: 53 mL/min — AB (ref >60)
Glucose, Bld: 145 mg/dL — ABNORMAL HIGH (ref 65–99)
Glucose, Bld: 115 mg/dL — ABNORMAL HIGH (ref 65–99)
POTASSIUM: 3 mmol/L — AB (ref 3.5–5.1)
Potassium: 3.1 mmol/L — ABNORMAL LOW (ref 3.5–5.1)
SODIUM: 155 mmol/L — AB (ref 135–145)
Sodium: 151 mmol/L — ABNORMAL HIGH (ref 135–145)

## 2017-01-28 LAB — GLUCOSE, CAPILLARY
GLUCOSE-CAPILLARY: 100 mg/dL — AB (ref 65–99)
GLUCOSE-CAPILLARY: 101 mg/dL — AB (ref 65–99)
GLUCOSE-CAPILLARY: 123 mg/dL — AB (ref 65–99)
GLUCOSE-CAPILLARY: 152 mg/dL — AB (ref 65–99)
GLUCOSE-CAPILLARY: 160 mg/dL — AB (ref 65–99)
GLUCOSE-CAPILLARY: 171 mg/dL — AB (ref 65–99)
Glucose-Capillary: 63 mg/dL — ABNORMAL LOW (ref 65–99)
Glucose-Capillary: 81 mg/dL (ref 65–99)

## 2017-01-28 MED ORDER — DEXTROSE 50 % IV SOLN
25.0000 mL | Freq: Once | INTRAVENOUS | Status: AC
  Administered 2017-01-28: 25 mL via INTRAVENOUS

## 2017-01-28 MED ORDER — ACETAMINOPHEN 650 MG RE SUPP: 650.0000 mg | RECTAL | Status: DC | PRN

## 2017-01-28 MED ORDER — POTASSIUM CHLORIDE 10 MEQ/100ML IV SOLN
10.0000 meq | INTRAVENOUS | Status: AC
  Administered 2017-01-28 (×4): 10 meq via INTRAVENOUS
  Filled 2017-01-28 (×4): qty 100

## 2017-01-28 MED ORDER — DEXTROSE 50 % IV SOLN
INTRAVENOUS | Status: AC
  Filled 2017-01-28: qty 50

## 2017-01-28 MED ORDER — POTASSIUM CHLORIDE 10 MEQ/100ML IV SOLN
10.0000 meq | INTRAVENOUS | Status: AC
  Administered 2017-01-28 – 2017-01-29 (×6): 10 meq via INTRAVENOUS
  Filled 2017-01-28 (×6): qty 100

## 2017-01-28 MED ORDER — ACETAMINOPHEN 325 MG PO TABS
650.0000 mg | ORAL_TABLET | Freq: Four times a day (QID) | ORAL | Status: DC | PRN
  Filled 2017-01-28 (×2): qty 2

## 2017-01-28 NOTE — Progress Notes (Signed)
eLink Physician-Brief Progress Note Patient Name: Gregory MasonKenneth D Horn DOB: 02/11/1965 MRN: [id number]   Date of Service  01/28/2017  HPI/Events of Note  Hypokalemia  eICU Interventions  Potassium replaced     Intervention Category Intermediate Interventions: Electrolyte abnormality - evaluation and management  DETERDING,ELIZABETH 01/28/2017, 8:45 PM

## 2017-01-28 NOTE — Progress Notes (Signed)
PT HYDROTHERAPY TREATMENT NOTE    Calcium alginate dressing has been previously ordered for this pt, however it appears the wound has minimal exudate, is partially covered in dry eschar and and is not significantly macerated--this PT discussed with RN that this may limit wound healing, as it is not keeping the wound bed moist to allow for autolytic debridement;  It would likely be more appropriate to use an enzymatic debridement ointment(Santyl) to progress the wound/aid in removal of eschar in conjunction with pulsed lavage and sharps debridement by PT; I have applied NS moistened gauze today to the sacral/coccygeal wound with foam covering for now; will await further orders from MD before initiating  additional changes to plan of care;       01/28/17 1800  Subjective Assessment  Subjective I want some ice chips  Patient and Family Stated Goals wounds healed, nods to pls lavage  Date of Onset (present on adm)  Prior Treatments dressing changes/nursing  Evaluation and Treatment  Evaluation and Treatment Procedures Explained to Patient/Family Yes  Evaluation and Treatment Procedures Patient unable to consent due to mental status  Pressure Injury 01/27/17 Deep Tissue Injury - Purple or maroon localized area of discolored intact skin or blood-filled blister due to damage of underlying soft tissue from pressure and/or shear. PT  Date First Assessed/Time First Assessed: 01/27/17 1600   Location: Coccyx  Staging: Deep Tissue Injury - Purple or maroon localized area of discolored intact skin or blood-filled blister due to damage of underlying soft tissue from pressure and/or she...  Dressing Type Foam;Moist to dry  Dressing Change Frequency Daily  State of Healing Eschar  Site / Wound Assessment Pink;Red;Black;Brown;Granulation tissue  % Wound base Red or Granulating 10%  % Wound base Yellow/Fibrinous Exudate 30%  % Wound base Black/Eschar 60%  Wound Length (cm) (see eval)  Margins Unattached  edges (unapproximated)  Drainage Amount Minimal  Drainage Description No odor (odor from alginate dressing)  Treatment Debridement (Selective);Hydrotherapy (Pulse lavage)  Hydrotherapy  Pulsed lavage therapy - wound location coccyx and supr-gluteal fold andr side of gluteal fold  Pulsed Lavage with Suction (psi) 8 psi  Pulsed Lavage with Suction - Normal Saline Used 1000 mL  Pulsed Lavage Tip Tip with splash shield  Selective Debridement  Selective Debridement - Location attempted to loosen eschar, cross hatch  Selective Debridement - Tools Used Forceps;Scissors  Selective Debridement - Tissue Removed small amount of black eschar  Wound Therapy - Assess/Plan/Recommendations  Wound Therapy - Clinical Statement 51 yo male  admitted after apparently not  moving, eating drinking for several days, developed pressure injury(multiple areas). Admitted with respiratory distress requiring ventilator; currently on NRB with stable VS during hydrotherapy; The patient will benefit from Hydrotherapy wound treatment to increase healing and allow mobility.  Wound Therapy - Functional Problem List limited mobility d/t global deconditioning  Factors Delaying/Impairing Wound Healing Incontinence;Immobility;Multiple medical problems  Hydrotherapy Plan Debridement;Dressing change;Patient/family education;Pulsatile lavage with suction  Wound Therapy - Frequency 6X / week  Wound Therapy - Current Recommendations PT;OT  Wound Therapy - Follow Up Recommendations Other (comment);Skilled nursing facility (?[facility])  Wound Plan PLS and dressing change  Wound Therapy Goals - Improve the function of patient's integumentary system by progressing the wound(s) through the phases of wound healing by:  Decrease Necrotic Tissue to 50  Decrease Necrotic Tissue - Progress Progressing toward goal  Increase Granulation Tissue to 50  Increase Granulation Tissue - Progress Progressing toward goal  Goals/treatment plan/discharge  plan were made with and agreed  upon by patient/family No, Patient unable to participate in goals/treatment/discharge plan and family unavailable  Time For Goal Achievement 2 weeks  Wound Therapy - Potential for Goals Gregory MachoFair  [name], PT Pager: [phone] 01/28/2017

## 2017-01-28 NOTE — Progress Notes (Signed)
Pt keeps taking off nonrebreather mask and desatting in the mid 70s. Recommended pt per elink to go back on bipap but pt is refusing saying we wants to wear the other mask. Nonrebreather put back on and pt strongly encouraged to keep it on.

## 2017-01-28 NOTE — Progress Notes (Signed)
[facility] LAB REPLACEMENT ONLY  The patient does apply for the [facility] Electrolyte Replacment Protocol based on the criteria listed below:   1. Is GFR >/= 40 ml/min? Yes.    Patient's GFR today is >60 2. Is urine output >/= 0.5 ml/kg/hr for the last 6 hours? Yes.   Patient's UOP is 1.6 ml/kg/hr 3. Is BUN < 60 mg/dL? Yes.    Patient's BUN today is 30 4. Abnormal electrolyte(s):3.0 5. Ordered repletion with: per protocol 6. If a panic level lab has been reported, has the CCM MD in charge been notified? Yes.  .   Physician:  Dr. Roxy Mannseterding  [name], Gregory BoosMaria Horn 01/28/2017 5:27 AM

## 2017-01-28 NOTE — Progress Notes (Signed)
SLP Cancellation Note  Patient Details Name: Manley MasonKenneth D Cripps MRN: [id number] DOB: 05/24/1965   Cancelled treatment:       Reason Eval/Treat Not Completed: Other (comment). SLP was not able to see patient today for MBS. Will attempt next date.  Angela NevinJohn T. Preston, MA, CCC-SLP 01/28/17 5:44 PM

## 2017-01-28 NOTE — Progress Notes (Signed)
[facility] P[facility]M PROGRESS NOTE  Date of Admission: 01/18/2017  Referring Provider: Dr. Fayrene FearingJames, ER Chief Complaint: Weakness  HPI: 51 yo male with weakness and decreased appetite.  Found to have altered mental status, hyperglycemia, hypotensive with RLL PNA.  Required intubation for hypoxia with ARDS.  Past Medical History: He  has a past medical history of Atrial fib/flutter, transient (09/19/2011), Elevated cholesterol with elevated triglycerides, Hypertension, Kidney stones, Pancreatitis (1999), Pneumonia (~ 2003), Sleep apnea, and Type II diabetes mellitus (HCC).  Subjective: Feels better.  Didn't want to use Bipap last night.  Vital signs: BP 119/80   Pulse 80   Temp 99.1 F (37.3 C)   Resp 19   Ht 5\' 9"  (1.753 m)   Wt 188 lb 11.4 oz (85.6 kg)   SpO2 98%   BMI 27.87 kg/m   Intake/Output: I/O last 3 completed shifts: In: 2921.8 [I.V.:2121.8; IV Piggyback:800] Out: 4098 [[id number] [Urine:4855; Stool:180]  Physical Exam:  General - more alert Eyes - pupils reactive ENT - no stridor Cardiac - regular, no murmur Chest - no wheeze, rales Abd - soft, non tender Ext - decreased edema Skin - pressure wounds coccyx, Rt ischium Neuro - strength improving  Discussion: [age] yo male with acute hypoxic respiratory failure and septic shock from CAP complicated by ARDS, HONK.  Assessment/Plan:  Acute hypoxic respiratory failure 2nd to CAP. - improving - oxygen to keep SpO2 90 to 95% - d/c Bipap >> not using anymore - f/u CXR intermittently  Hypertension. History of paroxysmal atrial fibrillation. - prn labetalol, hydralazine - lovenox for A fib - resume xarelto when he can take pills  Hypernatremia. Acute renal failure 2nd to ATN. Hypokalemia. - improving - need to match I's and O's - D5w at 100 ml/hr - replace electrolytes as needed  DM type II. - SSI with lantus  Dysphagia. Chronic pancreatitis. - f/u with speech  Unstageable coccyx pressure wound. Stage 3 Rt ischial  pressure wound. Rt ear deep tissue injury. - all present prior to admission - Rt ischial wound >> NS cleanse, pat dry, apply algisite alginate dressing, cover with silicone foam, change daily - Coccyx >> NS clease, apply algisite algiante, hydrotherapy for debridement, cover with silicone border foam dressing - Rt ear >> silicone border foam  Acute metabolic encephalopathy. Weakness in setting of critical illness and neuromuscular blockade. Deconditioning. - continues to improve - monitor neuro status - PT assessment  DVT prophylaxis: lovenox SUP: no longer indicated >> d/c protonix Diet: NPO Goals of care: Full code  Transfer to SDU.  Will ask [facility] to assume care from 12/30 and PCCM off.  Coralyn HellingVineet Ashia Dehner, MD [facility] 01/28/2017, 8:13 AM Pager:  [phone] After 3pm call: [phone]  FLOW SHEET  Cultures: Urine 12/19 >> negative Blood 12/19 >> coag neg Staph Sputum 12/21 >> Candida Glabrata  Antibiotics: Zithromax 12/19 >> 12/22 Rocephin 12/19 >> 12/19 Zosyn 12/19 >>  Vancomycin 12/20 >> 12/22  Studies: CT abd/pelvis 12/20 >> small b/l effusions, b/l consolidation, pneumopericardium and pneumomediastinum, mild edema pancreatic head, GB sludge CT head 12/24 >> mild white matter changes  Events: 12/19 Admit 12/20 ARDS protocol, start nimbex 12/22 Off nimbex 12/23 Off pressors, sedation  Lines/tubes: ETT 12/19 >> 12/27 Femoral CVL 12/19 >> 12/24  Consults: 12/26 Wound care  Resolved Problems: ARDS, Septic shock, Lactic acidosis, HONK, relative adrenal insufficiency  Labs: CMP Latest Ref Rng & Units 01/28/2017 01/27/2017 01/27/2017  Glucose 65 - 99 mg/dL 528(U145(H) 132(G139(H) 401(U189(H)  BUN 6 - 20 mg/dL  30(H) 33(H) 41(H)  Creatinine 0.61 - 1.24 mg/dL 1.61(W1.46(H) 9.60(A1.53(H) 5.40(J1.77(H)  Sodium 135 - 145 mmol/L 155(H) 152(H) 156(H)  Potassium 3.5 - 5.1 mmol/L 3.0(L) 3.5 2.9(L)  Chloride 101 - 111 mmol/L 121(H) 118(H) 118(H)  CO2 22 - 32 mmol/L 29 27 30    Calcium 8.9 - 10.3 mg/dL 6.9(L) 6.9(L) 7.0(L)  Total Protein 6.5 - 8.1 g/dL - - -  Total Bilirubin 0.3 - 1.2 mg/dL - - -  Alkaline Phos 38 - 126 U/L - - -  AST 15 - 41 U/L - - -  ALT 17 - 63 U/L - - -    CBC Latest Ref Rng & Units 01/26/2017 01/25/2017 01/24/2017  WBC 4.0 - 10.5 K/uL 14.6(H) 12.7(H) 11.0(H)  Hemoglobin 13.0 - 17.0 g/dL 10.0(L) 10.2(L) 12.3(L)  Hematocrit 39.0 - 52.0 % 31.3(L) 32.3(L) 37.5(L)  Platelets 150 - 400 K/uL 246 197 177    CBG (last 3)  Recent Labs    01/28/17 0316 01/28/17 0725 01/28/17 0801  GLUCAP 171* 63* 100*    Imaging: No results found.

## 2017-01-28 NOTE — Evaluation (Signed)
Physical Therapy Evaluation Patient Details Name: [name] MRN: [id number] DOB: [birth year] Today's Date: 01/28/2017   History of Present Illness  51 year old man with a history of obesity, diabetes, hypertension, hyperlipidemia, obstructive sleep apnea, atrial fib/flutter with a history of prior cardioversion.  He called his son on the date of admission because he was feeling weak and unwell, reportedly had not been eating or drinking several days prior to admission;  He was brought by EMS to ED and was noted to be hypoglycemic, encephalopathic, hypotensive.  Pt was intubated in the ED on 12/19, EXT ?12/23; pt with multiple pressure injuries on admission  Clinical Impression  Pt admitted with above diagnosis. Pt currently with functional limitations due to the deficits listed below (see PT Problem List).  Pt will benefit from skilled PT to increase their independence and safety with mobility to allow discharge to the venue listed below.    Pt is grossly deconditioned, weak, with multiple medical issues; recommend LTACH vs SNF depending on wound care needs and pt progress; will follow in acute setting; VSS during PT session today    Follow Up Recommendations LTACH;SNF(vs)    Equipment Recommendations  None recommended by PT;Other (comment)(TBD)    Recommendations for Other Services       Precautions / Restrictions Precautions Precautions: Fall Precaution Comments: bil pressure relief boots Restrictions Weight Bearing Restrictions: No      Mobility  Bed Mobility Overal bed mobility: Needs Assistance Bed Mobility: Sidelying to Sit;Sit to Sidelying;Rolling Rolling: Total assist Sidelying to sit: +2 for physical assistance;Total assist;+2 for safety/equipment     Sit to sidelying: +2 for physical assistance;Total assist;+2 for safety/equipment General bed mobility comments: assist with trunk and LEs on and off bed; bed pad utilized for positioning  Transfers                  General transfer comment: NT at this time  Ambulation/Gait                Stairs            Wheelchair Mobility    Modified Rankin (Stroke Patients Only)       Balance Overall balance assessment: Needs assistance Sitting-balance support: Feet supported;Bilateral upper extremity supported;Single extremity supported Sitting balance-Leahy Scale: Zero Sitting balance - Comments: pt with multi-directional LOB, much difficulty maintaining trunk extension and midline Postural control: Right lateral lean;Left lateral lean;Posterior lean     Standing balance comment: NT/unable                             Pertinent Vitals/Pain Pain Assessment: No/denies pain    Home Living Family/patient expects to be discharged to:: Private residence Living Arrangements: Spouse/significant other;Children Available Help at Discharge: Family             Additional Comments: family stepped out of room (prior to wound care); pt is not a reliable historian at the present time    Prior Function                 Hand Dominance        Extremity/Trunk Assessment   Upper Extremity Assessment Upper Extremity Assessment: RUE deficits/detail;LUE deficits/detail RUE Deficits / Details: minimally edematous, strength grossly 2+/5, grip fair RUE Coordination: decreased fine motor;decreased gross motor LUE Deficits / Details: moderately edematous distally; strength 2+/5, grip poor LUE Coordination: decreased fine motor;decreased gross motor    Lower Extremity Assessment Lower Extremity Assessment:  RLE deficits/detail;LLE deficits/detail RLE Deficits / Details: strength grossly 2 to 2+/5, AAROM grossly WFL RLE Coordination: decreased fine motor;decreased gross motor LLE Deficits / Details: same as above LLE Coordination: decreased fine motor;decreased gross motor    Cervical / Trunk Assessment Cervical / Trunk Assessment: Kyphotic;Other exceptions Cervical /  Trunk Exceptions: forward head  Communication   Communication: No difficulties  Cognition Arousal/Alertness: Awake/alert Behavior During Therapy: Flat affect Overall Cognitive Status: Impaired/Different from baseline Area of Impairment: Orientation;Attention;Following commands;Problem solving;Safety/judgement                 Orientation Level: Disoriented to;Place;Situation Current Attention Level: Sustained   Following Commands: Follows one step commands inconsistently;Follows one step commands with increased time;Follows multi-step commands inconsistently Safety/Judgement: Decreased awareness of deficits   Problem Solving: Slow processing;Difficulty sequencing;Requires verbal cues;Requires tactile cues General Comments: pt states he is in "[place]"; does not know he is at the hospital,  is not sure why he is here when oriented to location; "pt states "YEP" to most questions; he is cooperative with PT      General Comments      Exercises Other Exercises Other Exercises: manual anterior chest stretch x 5   Assessment/Plan    PT Assessment Patient needs continued PT services  PT Problem List Decreased strength;Decreased range of motion;Decreased activity tolerance;Decreased balance;Decreased cognition;Decreased mobility;Decreased knowledge of use of DME;Decreased safety awareness;Decreased coordination       PT Treatment Interventions Therapeutic activities;Therapeutic exercise;Patient/family education;Functional mobility training;Balance training    PT Goals (Current goals can be found in the Care Plan section)  Acute Rehab PT Goals Patient Stated Goal: none stated PT Goal Formulation: Patient unable to participate in goal setting Potential to Achieve Goals: Good    Frequency Min 2X/week   Barriers to discharge        Co-evaluation               AM-PAC PT "6 Clicks" Daily Activity  Outcome Measure Difficulty turning over in bed (including adjusting  bedclothes, sheets and blankets)?: Unable Difficulty moving from lying on back to sitting on the side of the bed? : Unable Difficulty sitting down on and standing up from a chair with arms (e.g., wheelchair, bedside commode, etc,.)?: Unable Help needed moving to and from a bed to chair (including a wheelchair)?: Total Help needed walking in hospital room?: Total Help needed climbing 3-5 steps with a railing? : Total 6 Click Score: 6    End of Session   Activity Tolerance: Patient limited by fatigue Patient left: in bed;with bed alarm set;with call bell/phone within reach Nurse Communication: Mobility status PT Visit Diagnosis: Muscle weakness (generalized) (M62.81);Adult, failure to thrive (R62.7)    Time: [id number] PT Time Calculation (min) (ACUTE ONLY): 14 min   Charges:   PT Evaluation $PT Eval Moderate Complexity: 1 Mod     PT G CodesDrucilla Chalet:        Loman Logan, PT Pager: [phone] 01/28/2017   [facility] CampusWILLIAMS,Ortha Metts 01/28/2017, 6:06 PM

## 2017-01-29 ENCOUNTER — Other Ambulatory Visit (HOSPITAL_COMMUNITY): Payer: Self-pay

## 2017-01-29 LAB — CBC
HEMATOCRIT: 30.9 % — AB (ref 39.0–52.0)
Hemoglobin: 9.7 g/dL — ABNORMAL LOW (ref 13.0–17.0)
MCH: 28.3 pg (ref 26.0–34.0)
MCHC: 31.4 g/dL (ref 30.0–36.0)
MCV: 90.1 fL (ref 78.0–100.0)
Platelets: 276 10*3/uL (ref 150–400)
RBC: 3.43 MIL/uL — ABNORMAL LOW (ref 4.22–5.81)
RDW: 15.6 % — AB (ref 11.5–15.5)
WBC: 8.2 10*3/uL (ref 4.0–10.5)

## 2017-01-29 LAB — GLUCOSE, CAPILLARY
GLUCOSE-CAPILLARY: 146 mg/dL — AB (ref 65–99)
Glucose-Capillary: 118 mg/dL — ABNORMAL HIGH (ref 65–99)
Glucose-Capillary: 148 mg/dL — ABNORMAL HIGH (ref 65–99)
Glucose-Capillary: 112 mg/dL — ABNORMAL HIGH (ref 65–99)
Glucose-Capillary: 119 mg/dL — ABNORMAL HIGH (ref 65–99)
Glucose-Capillary: 150 mg/dL — ABNORMAL HIGH (ref 65–99)

## 2017-01-29 LAB — BASIC METABOLIC PANEL
ANION GAP: 7 (ref 5–15)
BUN: 18 mg/dL (ref 6–20)
CO2: 26 mmol/L (ref 22–32)
Calcium: 6.5 mg/dL — ABNORMAL LOW (ref 8.9–10.3)
Chloride: 113 mmol/L — ABNORMAL HIGH (ref 101–111)
Creatinine, Ser: 1.18 mg/dL (ref 0.61–1.24)
GFR calc Af Amer: >60 mL/min (ref >60)
GFR calc non Af Amer: >60 mL/min (ref >60)
GLUCOSE: 148 mg/dL — AB (ref 65–99)
POTASSIUM: 3.6 mmol/L (ref 3.5–5.1)
Sodium: 146 mmol/L — ABNORMAL HIGH (ref 135–145)

## 2017-01-29 NOTE — Progress Notes (Addendum)
PROGRESS NOTE    [name]  NOT:[id number] DOB: [birth year] DOA: 01/18/2017 PCP: [name], MD     Brief Narrative:  [name] is a 51 yo male with a history of obesity, diabetes, hypertension, hyperlipidemia, obstructive sleep apnea, atrial fib/flutter with a history of prior cardioversion.  He called his son on 12/19 because he was feeling weak and unwell.  He reportedly had not been eating or drinking for the past several days prior to this.  He was brought in by EMS and was noted to be hyperglycemic, encephalopathic, hypotensive.  He was given insulin and IV fluids.  Chest x-ray revealed right lower lobe pneumonia and patient was started on antibiotics for community-acquired versus aspiration pneumonia.  Due to overall decline, patient was intubated for airway protection on 12/19.  He was admitted to ICU.  [facility] hospitalist assumed care 12/30.  Assessment & Plan:   Active Problems:   Pneumonia   Malnutrition of moderate degree   Pressure injury of skin   Acute hypoxemic respiratory failure secondary to pneumonia, ARDS  -Intubated 12/19, Extubated 12/27, Now off BiPAP  -Currently remains on nonrebreather mask -Repeat CXR   Septic shock secondary to community-acquired versus aspiration pneumonia -Now off pressors -Sputum culture with moderate Candida glabrata -Blood culture negative at 5 days, had 1 of 2 bottles with coag negative staph which represents contamination -Completed antibiotics   Acute metabolic encephalopathy -Slowly improving   Acute kidney injury likely due to hypoperfusion -Creatinine peak at 2.96, trended back down to normal  History of atrial relation/flutter -Resume Xarelto when able to take PO. Continue lovenox.   DKA -Presented to the emergency department with blood glucose 814, anion gap 27 with ketones in urine and pH of 7.16 -Treated with IV insulin, blood sugars much better controlled now -Check HA1c -Lantus, SSI    Hypernatremia -Secondary to poor oral intake -Now improving, continue to trend BMP  Hypokalemia -Replaced, continue to trend  Dysphagia -SLP following  Unstageable coccyx pressure wound, stage 3 Rt ischial pressure wound. Rt ear deep tissue injury. POA.  -Wound care consulted, PT Hydrotherapy    DVT prophylaxis: lovenox Code Status: Full Family Communication: no family at bedside Disposition Plan: pending improvement    Consultants:   CCM admit  Antimicrobials:  Anti-infectives (From admission, onward)   Start     Dose/Rate Route Frequency Ordered Stop   01/21/17 1000  vancomycin (VANCOCIN) 1,500 mg in sodium chloride 0.9 % 500 mL IVPB  Status:  Discontinued     1,500 mg 250 mL/hr over 120 Minutes Intravenous Every 48 hours 01/20/17 1001 01/21/17 0904   01/20/17 1200  vancomycin (VANCOCIN) IVPB 1000 mg/200 mL premix  Status:  Discontinued     1,000 mg 200 mL/hr over 60 Minutes Intravenous Every 24 hours 01/19/17 1056 01/20/17 1001   01/19/17 1400  azithromycin (ZITHROMAX) 500 mg in dextrose 5 % 250 mL IVPB  Status:  Discontinued     500 mg 250 mL/hr over 60 Minutes Intravenous Every 24 hours 01/18/17 1733 01/21/17 0909   01/19/17 1130  vancomycin (VANCOCIN) 1,500 mg in sodium chloride 0.9 % 500 mL IVPB     1,500 mg 250 mL/hr over 120 Minutes Intravenous  Once 01/19/17 1056 01/19/17 1330   01/19/17 0000  piperacillin-tazobactam (ZOSYN) IVPB 3.375 g     3.375 g 12.5 mL/hr over 240 Minutes Intravenous Every 8 hours 01/18/17 1746 01/25/17 1350   01/18/17 1600  piperacillin-tazobactam (ZOSYN) IVPB 3.375 g  3.375 g 100 mL/hr over 30 Minutes Intravenous  Once 01/18/17 1547 01/18/17 1812   01/18/17 1315  azithromycin (ZITHROMAX) 500 mg in dextrose 5 % 250 mL IVPB     500 mg 250 mL/hr over 60 Minutes Intravenous  Once 01/18/17 1303 01/18/17 1523   01/18/17 1315  cefTRIAXone (ROCEPHIN) 2 g in dextrose 5 % 50 mL IVPB     2 g 100 mL/hr over 30 Minutes Intravenous  Once  01/18/17 1303 01/18/17 1405        Subjective: No acute events, maintained on NRB with intermittent desat. Currently satting well. No complaints of pain.   Objective: Vitals:   01/29/17 0900 01/29/17 1000 01/29/17 1100 01/29/17 1200  BP: (!) 115/54 115/69  126/73  Pulse: 71 74 70 73  Resp: 15 20 (!) 25 19  Temp: 99.9 F (37.7 C) 99.5 F (37.5 C) 99.3 F (37.4 C) 99.5 F (37.5 C)  TempSrc:      SpO2: 99% 98% 98% 100%  Weight:      Height:        Intake/Output Summary (Last 24 hours) at 01/29/2017 1334 Last data filed at 01/29/2017 0600 Gross per 24 hour  Intake 1900 ml  Output 2110 ml  Net -210 ml   Filed Weights   01/26/17 0500 01/27/17 0452 01/28/17 0600  Weight: 87.3 kg (192 lb 7.4 oz) 84.1 kg (185 lb 6.5 oz) 85.6 kg (188 lb 11.4 oz)    Examination:  General exam: Appears calm and comfortable  Respiratory system: Respiratory effort normal. On NRB  Cardiovascular system: S1 & S2 heard, RRR. No JVD, murmurs, rubs, gallops or clicks. No pedal edema. Gastrointestinal system: Abdomen is nondistended, soft and nontender. No organomegaly or masses felt. Normal bowel sounds heard. Central nervous system: Alert. No focal neurological deficits. Extremities: Symmetric  Skin: No rashes, lesions or ulcers  Data Reviewed: I have personally reviewed following labs and imaging studies  CBC: Recent Labs  Lab 01/23/17 0458 01/24/17 0448 01/25/17 0506 01/26/17 0305 01/29/17 0608  WBC 8.7 11.0* 12.7* 14.6* 8.2  HGB 11.0* 12.3* 10.2* 10.0* 9.7*  HCT 32.6* 37.5* 32.3* 31.3* 30.9*  MCV 84.0 86.4 88.5 89.7 90.1  PLT 156 177 197 246 301   Basic Metabolic Panel: Recent Labs  Lab 01/24/17 0928 01/24/17 1715 01/25/17 0506 01/25/17 1702  01/26/17 1642 01/27/17 0311 01/27/17 1950 01/28/17 0311 01/28/17 1939 01/29/17 0608  NA  --   --  153*  --    < > 159* 156* 152* 155* 151* 146*  K  --   --  2.8*  --    < > 4.5 2.9* 3.5 3.0* 3.1* 3.6  CL  --   --  117*  --    < >  124* 118* 118* 121* 116* 113*  CO2  --   --  26  --    < > 29 30 27 29 26 26   GLUCOSE  --   --  304*  --    < > 164* 189* 139* 145* 115* 148*  BUN  --   --  64*  --    < > 50* 41* 33* 30* 22* 18  CREATININE  --   --  2.47*  --    < > 2.00* 1.77* 1.53* 1.46* 1.24 1.18  CALCIUM  --   --  6.6*  --    < > 7.0* 7.0* 6.9* 6.9* 6.9* 6.5*  MG 2.1 2.0 1.9 1.8  --  1.8  --   --   --   --   --  PHOS 3.7 3.7 4.1 3.4  --   --   --   --   --   --   --    < > = values in this interval not displayed.   GFR: Estimated Creatinine Clearance: 78.5 mL/min (by C-G formula based on SCr of 1.18 mg/dL). Liver Function Tests: Recent Labs  Lab 01/24/17 0448 01/25/17 0506  AST 72* 36  ALT 56 37  ALKPHOS 842* 502*  BILITOT 1.5* 0.9  PROT 6.1* 5.4*  ALBUMIN 1.7* 1.5*   No results for input(s): LIPASE, AMYLASE in the last 168 hours. No results for input(s): AMMONIA in the last 168 hours. Coagulation Profile: No results for input(s): INR, PROTIME in the last 168 hours. Cardiac Enzymes: No results for input(s): CKTOTAL, CKMB, CKMBINDEX, TROPONINI in the last 168 hours. BNP (last 3 results) No results for input(s): PROBNP in the last 8760 hours. HbA1C: No results for input(s): HGBA1C in the last 72 hours. CBG: Recent Labs  Lab 01/28/17 1953 01/28/17 2351 01/29/17 0303 01/29/17 0736 01/29/17 1126  GLUCAP 101* 160* 118* 146* 148*   Lipid Profile: No results for input(s): CHOL, HDL, LDLCALC, TRIG, CHOLHDL, LDLDIRECT in the last 72 hours. Thyroid Function Tests: No results for input(s): TSH, T4TOTAL, FREET4, T3FREE, THYROIDAB in the last 72 hours. Anemia Panel: No results for input(s): VITAMINB12, FOLATE, FERRITIN, TIBC, IRON, RETICCTPCT in the last 72 hours. Sepsis Labs: No results for input(s): PROCALCITON, LATICACIDVEN in the last 168 hours.  Recent Results (from the past 240 hour(s))  Culture, respiratory (NON-Expectorated)     Status: None   Collection Time: 01/20/17 11:30 AM  Result Value  Ref Range Status   Specimen Description TRACHEAL ASPIRATE  Final   Special Requests NONE  Final   Gram Stain   Final    ABUNDANT WBC PRESENT, PREDOMINANTLY PMN RARE SQUAMOUS EPITHELIAL CELLS PRESENT ABUNDANT BUDDING YEAST SEEN Performed at [facility], [address] [place], [place] [address]    Culture MODERATE CANDIDA GLABRATA  Final   Report Status 01/23/2017 FINAL  Final       Radiology Studies: No results found.    Scheduled Meds: . chlorhexidine gluconate (MEDLINE KIT)  15 mL Mouth Rinse BID  . enoxaparin (LOVENOX) injection  80 mg Subcutaneous Q12H  . insulin aspart  0-20 Units Subcutaneous Q4H  . insulin glargine  10 Units Subcutaneous BID  . mouth rinse  15 mL Mouth Rinse QID   Continuous Infusions: . dextrose 100 mL/hr at 01/29/17 0600     LOS: 11 days    Time spent: 40 minutes   Dessa Phi, DO [facility] [email or web address] Password [email or web address] 01/29/2017, 1:34 PM

## 2017-01-29 NOTE — Progress Notes (Signed)
SLP Cancellation Note  Patient Details Name: [name] MRN: [id number] DOB: 06/27/1965   Cancelled treatment:       Reason Eval/Treat Not Completed: Medical issues which prohibited therapy. Patient now on non-rebreather. Given overt indication of decreased airway protection noted on initial bedside evaluation 12/28, recommend holding MBS until patient can maintain O2 saturation levels on nasal cannula.   Ferdinand LangoLeah Latrease Kunde MA, CCC-SLP [phone]    [name] 01/29/2017, 1:00 PM

## 2017-01-30 ENCOUNTER — Inpatient Hospital Stay (HOSPITAL_COMMUNITY): Payer: BC Managed Care – PPO

## 2017-01-30 DIAGNOSIS — E870 Hyperosmolality and hypernatremia: Secondary | ICD-10-CM

## 2017-01-30 DIAGNOSIS — J9601 Acute respiratory failure with hypoxia: Secondary | ICD-10-CM

## 2017-01-30 DIAGNOSIS — G9341 Metabolic encephalopathy: Secondary | ICD-10-CM

## 2017-01-30 DIAGNOSIS — J9621 Acute and chronic respiratory failure with hypoxia: Secondary | ICD-10-CM

## 2017-01-30 DIAGNOSIS — A419 Sepsis, unspecified organism: Secondary | ICD-10-CM

## 2017-01-30 DIAGNOSIS — E876 Hypokalemia: Secondary | ICD-10-CM

## 2017-01-30 DIAGNOSIS — N179 Acute kidney failure, unspecified: Secondary | ICD-10-CM

## 2017-01-30 DIAGNOSIS — E1122 Type 2 diabetes mellitus with diabetic chronic kidney disease: Secondary | ICD-10-CM

## 2017-01-30 DIAGNOSIS — R6521 Severe sepsis with septic shock: Secondary | ICD-10-CM

## 2017-01-30 DIAGNOSIS — J80 Acute respiratory distress syndrome: Secondary | ICD-10-CM

## 2017-01-30 DIAGNOSIS — N183 Chronic kidney disease, stage 3 (moderate): Secondary | ICD-10-CM

## 2017-01-30 DIAGNOSIS — L89153 Pressure ulcer of sacral region, stage 3: Secondary | ICD-10-CM

## 2017-01-30 LAB — CBC
HCT: 31.2 % — ABNORMAL LOW (ref 39.0–52.0)
Hemoglobin: 9.9 g/dL — ABNORMAL LOW (ref 13.0–17.0)
MCH: 28 pg (ref 26.0–34.0)
MCHC: 31.7 g/dL (ref 30.0–36.0)
MCV: 88.1 fL (ref 78.0–100.0)
PLATELETS: 280 10*3/uL (ref 150–400)
RBC: 3.54 MIL/uL — AB (ref 4.22–5.81)
RDW: 14.6 % (ref 11.5–15.5)
WBC: 7.6 10*3/uL (ref 4.0–10.5)

## 2017-01-30 LAB — BLOOD GAS, ARTERIAL
Acid-Base Excess: 2.8 mmol/L — ABNORMAL HIGH (ref 0.0–2.0)
Bicarbonate: 25.4 mmol/L (ref 20.0–28.0)
Drawn by: 422461
FIO2: 100
O2 Content: 15 L/min
O2 Saturation: 93.2 %
Patient temperature: 37
pCO2 arterial: 32.9 mmHg (ref 32.0–48.0)
pH, Arterial: 7.5 — ABNORMAL HIGH (ref 7.350–7.450)
pO2, Arterial: 66.9 mmHg — ABNORMAL LOW (ref 83.0–108.0)

## 2017-01-30 LAB — GLUCOSE, CAPILLARY
GLUCOSE-CAPILLARY: 131 mg/dL — AB (ref 65–99)
GLUCOSE-CAPILLARY: 181 mg/dL — AB (ref 65–99)
Glucose-Capillary: 115 mg/dL — ABNORMAL HIGH (ref 65–99)
Glucose-Capillary: 133 mg/dL — ABNORMAL HIGH (ref 65–99)
Glucose-Capillary: 111 mg/dL — ABNORMAL HIGH (ref 65–99)
Glucose-Capillary: 81 mg/dL (ref 65–99)

## 2017-01-30 LAB — BASIC METABOLIC PANEL
Anion gap: 7 (ref 5–15)
BUN: 13 mg/dL (ref 6–20)
CO2: 25 mmol/L (ref 22–32)
CREATININE: 1.02 mg/dL (ref 0.61–1.24)
Calcium: 6.4 mg/dL — CL (ref 8.9–10.3)
Chloride: 108 mmol/L (ref 101–111)
Glucose, Bld: 130 mg/dL — ABNORMAL HIGH (ref 65–99)
POTASSIUM: 3 mmol/L — AB (ref 3.5–5.1)
SODIUM: 140 mmol/L (ref 135–145)

## 2017-01-30 MED ORDER — LORAZEPAM 2 MG/ML IJ SOLN
1.0000 mg | Freq: Once | INTRAMUSCULAR | Status: AC
  Administered 2017-01-30: 1 mg via INTRAVENOUS
  Filled 2017-01-30: qty 1

## 2017-01-30 MED ORDER — COLLAGENASE 250 UNIT/GM EX OINT
TOPICAL_OINTMENT | Freq: Every day | CUTANEOUS | Status: DC
  Administered 2017-01-30: 1 via TOPICAL
  Administered 2017-01-31 – 2017-02-14 (×15): via TOPICAL
  Filled 2017-01-30 (×2): qty 90

## 2017-01-30 MED ORDER — POTASSIUM CHLORIDE 20 MEQ/15ML (10%) PO SOLN
40.0000 meq | Freq: Two times a day (BID) | ORAL | Status: AC
  Administered 2017-01-30 (×2): 40 meq via ORAL
  Filled 2017-01-30 (×2): qty 30

## 2017-01-30 MED ORDER — FUROSEMIDE 10 MG/ML IJ SOLN
20.0000 mg | Freq: Two times a day (BID) | INTRAMUSCULAR | Status: DC
  Administered 2017-01-30 (×2): 20 mg via INTRAVENOUS
  Filled 2017-01-30 (×2): qty 2

## 2017-01-30 MED ORDER — POTASSIUM CHLORIDE CRYS ER 20 MEQ PO TBCR: 40.0000 meq | EXTENDED_RELEASE_TABLET | Freq: Two times a day (BID) | ORAL | Status: DC

## 2017-01-30 MED ORDER — FUROSEMIDE 10 MG/ML IJ SOLN: 40.0000 mg | Freq: Two times a day (BID) | INTRAMUSCULAR | Status: DC

## 2017-01-30 MED ORDER — POTASSIUM CHLORIDE 10 MEQ/100ML IV SOLN
10.0000 meq | INTRAVENOUS | Status: AC
  Administered 2017-01-30 (×3): 10 meq via INTRAVENOUS
  Filled 2017-01-30 (×3): qty 100

## 2017-01-30 NOTE — Progress Notes (Signed)
ANTICOAGULATION CONSULT NOTE - Follow up Consult  Pharmacy Consult for enoxaparin Indication: atrial fibrillation  No Known Allergies  Patient Measurements: Height: 5\' 9"  (175.3 cm) Weight: 190 lb 0.6 oz (86.2 kg) IBW/kg (Calculated) : 70.7  Vital Signs: Temp: 99.7 F (37.6 C) (12/31 0800) Temp Source: Core (12/31 0800) BP: 113/72 (12/31 0800) Pulse Rate: 86 (12/31 0800)  Labs: Recent Labs    01/28/17 1939 01/29/17 0608 01/30/17 0323  HGB  --  9.7* 9.9*  HCT  --  30.9* 31.2*  PLT  --  276 280  CREATININE 1.24 1.18 1.02    Estimated Creatinine Clearance: 91.1 mL/min (by C-G formula based on SCr of 1.02 mg/dL).  Assessment: Pharmacy is consulted to dose enoxaparin for 51 yo male with atrial fibrillation.  He was previously on Xarelto to Afib, but Per cards note from 10/06/16 he is s/p DCCV on 09/29/16 and was instructed to stop Xarelto on 10/30/16.  Today, 01/30/17   Scr improved to 1.02  with CrCl ~  2591ml/min  CBC:  Hgb 9.9 stable, Plt WNL  No bleeding or complications reported.  Unable to do swallow eval 12/30 2nd non-rebreather   Goal of Therapy:  Anti-Xa level 0.6-1 units/ml 4hrs after LMWH dose given Monitor platelets by anticoagulation protocol: Yes   Plan:   Continue Enoxaparin 80 mg SQ q12h  Monitor for signs and symptoms of bleeding  Follow up plans for oral anticoag, swallow eval, renal function. Herby AbrahamMichelle T. Mily Malecki, Pharm.D. [phone] 01/30/2017 8:49 AM

## 2017-01-30 NOTE — Progress Notes (Signed)
Date: January 30, 2017 Marcelle SmilingRhonda Davis, BSN, [place] LagoonRN3, ConnecticutCCM [phone] Chart and notes review for patient progress and needs. Remains on Bipap. Will follow for case management and discharge needs. Next review date: [id number]

## 2017-01-30 NOTE — Consult Note (Signed)
WOC Nurse wound follow up Wound type: Pressure injury follow up Per recommendations of PT and hydrotherapy, calcium alginate dressing is drying wound bed at coccyx and autolysis of necrotic tissue is not occurring.  Will add enzymatic debriding agent (collagenase) and discontinue calcium alginate.  Will remain available to this patient, PT during and outside of hydrotherapy, the medical and nursing teams. Thanks, Ladona MowLaurie Lakayla Barrington, MSN, RN, GNP, Hans EdenCWOCN, CWON-AP, FAAN  Pager# [phone]

## 2017-01-30 NOTE — Progress Notes (Signed)
[facility] PROGRESS NOTE    Progress Note  [name]  TLX:[id number] DOB: 1965/11/23 DOA: 01/18/2017 PCP: [name], MD     Brief Narrative:   [name] is an 51 y.o. male with a history of obesity, diabetes, hypertension, hyperlipidemia, obstructive sleep apnea, atrial fib/flutter with a history of prior cardioversion.  He was brought in by EMS and was noted to be hyperglycemic, encephalopathic, hypotensive.   found to be in DKA and sepsis due to pneumonia. Due to overall decline, patient was intubated for airway protection on 12/19.  He was admitted to ICU.  [facility] hospitalist assumed care 12/30.    Assessment/Plan:   Acute respiratory failure with hypoxia due to pneumonia and  And ARDS: Intubated on 01/18/2017, then extubated on 01/26/2017, now on BiPAP. Repeated chest x-ray 01/25/2017 showed Stable pleural effusions with bibasilar atelectasis / consolidation worse on the right. School therapy evaluated the patient the recommended skilled nursing facility versus LTAC. We will give him a single dose of Lasix IV and see if his respiration improved, we will also replete his potassium orally.  Septic shock secondary to community-acquired pneumonia: Now off pressors, sputum cultures with moderate yeast. Blood cultures 1 out of 2 showed contaminant. He has completed his antibiotic. Has remained afebrile.  Acute encephalopathy: Continues to improve.  Acute kidney injury: Likely due to prerenal etiology. Now at baseline.  Diabetic ketoacidosis with coma associated with type 2 diabetes mellitus ([place]) On admission his blood sugar was 800 with a gap and a pH of 7.1. He was started on IV insulin and IV fluids his blood glucose is now controlled long-acting insulin plus sliding scale.  Hyponatremia Likely due to poor oral oral intake resolved with IV fluid.  Hypokalemia: Repleted orally now resolved.  Dysphagia: Speech evaluated the patient last  time on 01/27/2017 and recommended n.p.o. ice chips as needed. Formal swallowing evaluation is pending.   Malnutrition of moderate degree Awaiting formal speech evaluation.    DVT prophylaxis: Lovenox Family Communication:none Disposition Plan/Barrier to D/C: SNF vs LTAC Code Status:     Code Status Orders  (From admission, onward)        Start     Ordered   01/18/17 1735  Full code  Continuous     01/18/17 1735    Code Status History    Date Active Date Inactive Code Status Order ID Comments User Context   This patient has a current code status but no historical code status.        IV Access:    Peripheral IV   Procedures and diagnostic studies:   No results found.   Medical Consultants:    None.  Anti-Infectives:   none  Subjective:    [name] he relates his breathing is about the same no new complaints.  Objective:    Vitals:   01/30/17 0400 01/30/17 0500 01/30/17 0600 01/30/17 0700  BP: 116/66 107/63 122/68 106/71  Pulse: 84 (!) 56 88 87  Resp: (!) 22 (!) 22 (!) 23 (!) 24  Temp: 99.7 F (37.6 C) 99.9 F (37.7 C) 100 F (37.8 C) 99.5 F (37.5 C)  TempSrc:      SpO2: 95% 95% 98% 94%  Weight:   86.2 kg (190 lb 0.6 oz)   Height:        Intake/Output Summary (Last 24 hours) at 01/30/2017 0707 Last data filed at 01/30/2017 0700 Gross per 24 hour  Intake 2740 ml  Output 2690 ml  Net 50 ml   Filed Weights   01/27/17 0452 01/28/17 0600 01/30/17 0600  Weight: 84.1 kg (185 lb 6.5 oz) 85.6 kg (188 lb 11.4 oz) 86.2 kg (190 lb 0.6 oz)    Exam: General exam: In no acute distress. Respiratory system: Good air movement and clear to auscultation. Cardiovascular system: S1 & S2 heard, RRR. +JVD Gastrointestinal system: Abdomen is nondistended, soft and nontender.  Central nervous system: Alert and oriented. No focal neurological deficits. Extremities: No pedal edema. Skin: No rashes, lesions or ulcers   Data Reviewed:     Labs: Basic Metabolic Panel: Recent Labs  Lab 01/24/17 0928 01/24/17 1715 01/25/17 0506 01/25/17 1702  01/26/17 1642  01/27/17 1950 01/28/17 0311 01/28/17 1939 01/29/17 0608 01/30/17 0323  NA  --   --  153*  --    < > 159*   < > 152* 155* 151* 146* 140  K  --   --  2.8*  --    < > 4.5   < > 3.5 3.0* 3.1* 3.6 3.0*  CL  --   --  117*  --    < > 124*   < > 118* 121* 116* 113* 108  CO2  --   --  26  --    < > 29   < > 27 29 26 26 25   GLUCOSE  --   --  304*  --    < > 164*   < > 139* 145* 115* 148* 130*  BUN  --   --  64*  --    < > 50*   < > 33* 30* 22* 18 13  CREATININE  --   --  2.47*  --    < > 2.00*   < > 1.53* 1.46* 1.24 1.18 1.02  CALCIUM  --   --  6.6*  --    < > 7.0*   < > 6.9* 6.9* 6.9* 6.5* 6.4*  MG 2.1 2.0 1.9 1.8  --  1.8  --   --   --   --   --   --   PHOS 3.7 3.7 4.1 3.4  --   --   --   --   --   --   --   --    < > = values in this interval not displayed.   GFR Estimated Creatinine Clearance: 91.1 mL/min (by C-G formula based on SCr of 1.02 mg/dL). Liver Function Tests: Recent Labs  Lab 01/24/17 0448 01/25/17 0506  AST 72* 36  ALT 56 37  ALKPHOS 842* 502*  BILITOT 1.5* 0.9  PROT 6.1* 5.4*  ALBUMIN 1.7* 1.5*   No results for input(s): LIPASE, AMYLASE in the last 168 hours. No results for input(s): AMMONIA in the last 168 hours. Coagulation profile No results for input(s): INR, PROTIME in the last 168 hours.  CBC: Recent Labs  Lab 01/24/17 0448 01/25/17 0506 01/26/17 0305 01/29/17 0608 01/30/17 0323  WBC 11.0* 12.7* 14.6* 8.2 7.6  HGB 12.3* 10.2* 10.0* 9.7* 9.9*  HCT 37.5* 32.3* 31.3* 30.9* 31.2*  MCV 86.4 88.5 89.7 90.1 88.1  PLT 177 197 246 276 280   Cardiac Enzymes: No results for input(s): CKTOTAL, CKMB, CKMBINDEX, TROPONINI in the last 168 hours. BNP (last 3 results) No results for input(s): PROBNP in the last 8760 hours. CBG: Recent Labs  Lab 01/29/17 1126 01/29/17 1637 01/29/17 1912 01/29/17 2316 01/30/17 0351  GLUCAP 148*  112* 119* 150* 131*  D-Dimer: No results for input(s): DDIMER in the last 72 hours. Hgb A1c: No results for input(s): HGBA1C in the last 72 hours. Lipid Profile: No results for input(s): CHOL, HDL, LDLCALC, TRIG, CHOLHDL, LDLDIRECT in the last 72 hours. Thyroid function studies: No results for input(s): TSH, T4TOTAL, T3FREE, THYROIDAB in the last 72 hours.  Invalid input(s): FREET3 Anemia work up: No results for input(s): VITAMINB12, FOLATE, FERRITIN, TIBC, IRON, RETICCTPCT in the last 72 hours. Sepsis Labs: Recent Labs  Lab 01/25/17 0506 01/26/17 0305 01/29/17 0608 01/30/17 0323  WBC 12.7* 14.6* 8.2 7.6   Microbiology Recent Results (from the past 240 hour(s))  Culture, respiratory (NON-Expectorated)     Status: None   Collection Time: 01/20/17 11:30 AM  Result Value Ref Range Status   Specimen Description TRACHEAL ASPIRATE  Final   Special Requests NONE  Final   Gram Stain   Final    ABUNDANT WBC PRESENT, PREDOMINANTLY PMN RARE SQUAMOUS EPITHELIAL CELLS PRESENT ABUNDANT BUDDING YEAST SEEN Performed at [facility], [place] [address], [place], [place] [address]    Culture MODERATE CANDIDA GLABRATA  Final   Report Status 01/23/2017 FINAL  Final     Medications:   . chlorhexidine gluconate (MEDLINE KIT)  15 mL Mouth Rinse BID  . enoxaparin (LOVENOX) injection  80 mg Subcutaneous Q12H  . insulin aspart  0-20 Units Subcutaneous Q4H  . insulin glargine  10 Units Subcutaneous BID  . mouth rinse  15 mL Mouth Rinse QID   Continuous Infusions: . dextrose 100 mL/hr at 01/30/17 0700  . potassium chloride 10 mEq (01/30/17 0703)      LOS: 12 days   [facility] Pager [phone]  *Please refer to [email or web address], password [email or web address] to get updated schedule on who will round on this patient, as hospitalists switch teams weekly. If 7PM-7AM, please contact night-coverage at [email or web address], password [email or web address] for any overnight needs.  01/30/2017, 7:07 AM

## 2017-01-30 NOTE — Progress Notes (Signed)
CRITICAL VALUE ALERT  Critical Value:  Ca+ 6.4  Date & Time Notied:  01/30/18  Provider Notified: Dorrene GermanJ. [name], NCP  Orders Received/Actions taken:

## 2017-01-30 NOTE — Progress Notes (Signed)
PT HYDROTHERAPY TX NOTE  01/30/17 1700  Subjective Assessment  Subjective I want a steak  Patient and Family Stated Goals wounds healed, nods to pls lavage  Date of Onset (presetn on adm)  Prior Treatments dressing changes/nursing  Pressure Injury 01/27/17 Deep Tissue Injury - Purple or maroon localized area of discolored intact skin or blood-filled blister due to damage of underlying soft tissue from pressure and/or shear. PT  Date First Assessed/Time First Assessed: 01/27/17 1600   Location: Coccyx  Staging: Deep Tissue Injury - Purple or maroon localized area of discolored intact skin or blood-filled blister due to damage of underlying soft tissue from pressure and/or she...  Dressing Type Foam;Moist to dry  Dressing Change Frequency Daily  Site / Wound Assessment Granulation tissue;Red;Yellow;Black  % Wound base Red or Granulating 10%  % Wound base Yellow/Fibrinous Exudate 30%  % Wound base Black/Eschar 60%  Peri-wound Assessment Erythema (non-blanchable);Excoriated  Wound Length (cm) (see eval)  Treatment Cleansed;Debridement (Selective);Hydrotherapy (Pulse lavage);Packing (Saline gauze)  Hydrotherapy  Pulsed lavage therapy - wound location coccyx/sacrum and supra-gluteal fold and r side of gluteal fold  Pulsed Lavage with Suction (psi) 8 psi  Pulsed Lavage with Suction - Normal Saline Used 1000 mL  Pulsed Lavage Tip Tip with splash shield  Selective Debridement  Selective Debridement - Location sacral/coccygeal region  Selective Debridement - Tools Used Forceps;Scissors  Selective Debridement - Tissue Removed small amount of black eschar  Wound Therapy - Assess/Plan/Recommendations  Wound Therapy - Clinical Statement 51 yo male  admitted after apparently not  moving, eating drinking for several days, developed pressure injury(multiple areas). Admitted with respiratory distress requiring ventilator; currently on NRB with stable VS during hydrotherapy; The patient will benefit from  Hydrotherapy wound treatment to increase healing and allow mobility.  Wound Therapy - Functional Problem List limited mobility d/t global deconditioning  Factors Delaying/Impairing Wound Healing Incontinence;Immobility;Multiple medical problems  Hydrotherapy Plan Debridement;Dressing change;Patient/family education;Pulsatile lavage with suction  Wound Therapy - Frequency  6X / week  Wound Therapy - Follow Up Recommendations Skilled nursing facility (vs LTACH)  Wound Plan PLS and dressing change  Wound Therapy Goals - Improve the function of patient's integumentary system by progressing the wound(s) through the phases of wound healing by:  Decrease Necrotic Tissue to 50  Decrease Necrotic Tissue - Progress Progressing toward goal  Increase Granulation Tissue to 50  Increase Granulation Tissue - Progress Progressing toward goal  Patient/Family will be able to  off load sacrum  Patient/Family Instruction Goal - Progress Goal set today  Goals/treatment plan/discharge plan were made with and agreed upon by patient/family Yes  Time For Goal Achievement 2 weeks  Wound Therapy - Potential for Goals Josph MachoFair  Primo Innis, PT Pager: [phone] 01/30/2017

## 2017-01-30 NOTE — Progress Notes (Signed)
MBS completed, full report to follow.  Pt presents with moderate oral and severe pharyngeal dysphagia with sensorimotor deficits.  His laryngeal elevation/closure is impaired results in severe residuals (pyriform more than valleculae) and SILENT trace aspiration/penetration of EVERY consistency tested *thin, nectar, honey and pudding.  Pt has NO sensation to gross residuals nor aspiration and cued cough did not clear aspirates.    Chin tuck posture, head turn right *side pt was leaning to*, cued dry swallows and cued strong cough not helpful to prevent or clear aspirates.  In addition, pt oxygen saturation dropped to 80s - only coming up with max cues for breaths.    He was also internally distracted as he complained of discomfort with urination attempts and repeated requested for head of chair to be lowered.  This impacted his participation during the session.  If pt is to have po diet = it should be with known aspiration risk vs NPO x ice chips and oral care.    Hopeful for pt to improve with medical advancement but concerning for possible premorbid dysphagia/aspiration. Will advise MD to finding. Will follow up February 01, 2017 for dysphagia treatment/management.   Donavan Burnetamara Mora Pedraza, MS [facility] S[facility] [phone]

## 2017-01-30 NOTE — Progress Notes (Addendum)
Patient de-Sating into the low 80's respiratory at bedside. Applied high-flow n/c at 10 liters, and non-re breather.  Sat's now at 91-93 Patient refuses  to wear Bi-pap and he does not want to be be intubated . Notified [name], new orders noted and received for CXR, ABG and I mg of Ativan.

## 2017-01-30 NOTE — Progress Notes (Signed)
Physical Therapy Treatment Patient Details Name: Gregory MasonKenneth D Horn MRN: [id number] DOB: 08/20/1965 Today's Date: 01/30/2017    History of Present Illness 51 year old man with a history of obesity, diabetes, hypertension, hyperlipidemia, obstructive sleep apnea, atrial fib/flutter with a history of prior cardioversion.  He called his son on the date of admission because he was feeling weak and unwell, reportedly had not been eating or drinking several days prior to admission;  He was brought by EMS to ED and was noted to be hypoglycemic, encephalopathic, hypotensive.  Pt was intubated in the ED on 12/19, EXT ?12/23; pt with multiple pressure injuries on admission    PT Comments    Pt progressing slowly, refuses OOB today despite encouragement; incr pt effort and ability to assist with bed mobility; will certainly need post acute rehab--LTACH vs SNF depending on needs and progress; will continue to follow in acute setting  Follow Up Recommendations  LTACH;SNF     Equipment Recommendations  None recommended by PT;Other (comment)(TBD)    Recommendations for Other Services       Precautions / Restrictions Precautions Precautions: Fall Precaution Comments: bil pressure relief boots Restrictions Weight Bearing Restrictions: No    Mobility  Bed Mobility   Bed Mobility: Sidelying to Sit;Sit to Sidelying;Rolling Rolling: Min assist;Mod assist Sidelying to sit: +2 for physical assistance;Mod assist;+2 for safety/equipment;Max assist     Sit to sidelying: Mod assist;+2 for physical assistance;+2 for safety/equipment General bed mobility comments: rolls multiple times R/L for pad change and placement; assist with trunk and LEs on and off bed; bed pad utilized for positioning; incr pt effort today, multi-modal cues needed for pt to self assist  Transfers                 General transfer comment: pt declined OOB despite max encouragement from PT and RN  Ambulation/Gait                 Stairs            Wheelchair Mobility    Modified Rankin (Stroke Patients Only)       Balance Overall balance assessment: Needs assistance Sitting-balance support: Feet supported;Bilateral upper extremity supported;Single extremity supported Sitting balance-Leahy Scale: Poor Sitting balance - Comments: cues for trunk extension, reguiring decr assist (min to mod)overall to maintain static sit today                                    Cognition Arousal/Alertness: Awake/alert Behavior During Therapy: Flat affect                           Following Commands: Follows one step commands with increased time     Problem Solving: Slow processing;Difficulty sequencing;Requires verbal cues;Requires tactile cues General Comments: [facility]; appears mental status is improving       Exercises      General Comments        Pertinent Vitals/Pain Pain Assessment: No/denies pain    Home Living                      Prior Function            PT Goals (current goals can now be found in the care plan section) Acute Rehab PT Goals Patient Stated Goal: none stated PT Goal Formulation: Patient unable to  participate in goal setting Potential to Achieve Goals: Good Progress towards PT goals: Progressing toward goals    Frequency    Min 2X/week      PT Plan Current plan remains appropriate    Co-evaluation              AM-PAC PT "6 Clicks" Daily Activity  Outcome Measure  Difficulty turning over in bed (including adjusting bedclothes, sheets and blankets)?: Unable Difficulty moving from lying on back to sitting on the side of the bed? : Unable Difficulty sitting down on and standing up from a chair with arms (e.g., wheelchair, bedside commode, etc,.)?: Unable Help needed moving to and from a bed to chair (including a wheelchair)?: Total Help needed walking in hospital room?: Total Help needed  climbing 3-5 steps with a railing? : Total 6 Click Score: 6    End of Session   Activity Tolerance: Patient limited by fatigue;Other (comment)(declining incr activity) Patient left: in bed;with bed alarm set;with call bell/phone within reach Nurse Communication: Mobility status PT Visit Diagnosis: Muscle weakness (generalized) (M62.81);Adult, failure to thrive (R62.7)     Time: [id number] PT Time Calculation (min) (ACUTE ONLY): 34 min  Charges:  $Therapeutic Activity: 23-37 mins                    G CodesDrucilla Chalet:       Chevon Fomby, PT Pager: [phone] 01/30/2017    Drucilla ChaletWILLIAMS,[name] 01/30/2017, 5:05 PM

## 2017-01-31 DIAGNOSIS — J690 Pneumonitis due to inhalation of food and vomit: Secondary | ICD-10-CM

## 2017-01-31 LAB — HEPATIC FUNCTION PANEL
ALBUMIN: 1.4 g/dL — AB (ref 3.5–5.0)
ALK PHOS: 422 U/L — AB (ref 38–126)
ALT: 28 U/L (ref 17–63)
AST: 42 U/L — ABNORMAL HIGH (ref 15–41)
Bilirubin, Direct: <0.1 mg/dL — ABNORMAL LOW (ref 0.1–0.5)
TOTAL PROTEIN: 6.7 g/dL (ref 6.5–8.1)
Total Bilirubin: 0.5 mg/dL (ref 0.3–1.2)

## 2017-01-31 LAB — BASIC METABOLIC PANEL
ANION GAP: 8 (ref 5–15)
BUN: 13 mg/dL (ref 6–20)
CHLORIDE: 108 mmol/L (ref 101–111)
CO2: 24 mmol/L (ref 22–32)
Calcium: 6.5 mg/dL — ABNORMAL LOW (ref 8.9–10.3)
Creatinine, Ser: 1.07 mg/dL (ref 0.61–1.24)
GFR calc Af Amer: >60 mL/min (ref >60)
GLUCOSE: 233 mg/dL — AB (ref 65–99)
POTASSIUM: 3.7 mmol/L (ref 3.5–5.1)
SODIUM: 140 mmol/L (ref 135–145)

## 2017-01-31 LAB — GLUCOSE, CAPILLARY
GLUCOSE-CAPILLARY: 104 mg/dL — AB (ref 65–99)
GLUCOSE-CAPILLARY: 105 mg/dL — AB (ref 65–99)
Glucose-Capillary: 226 mg/dL — ABNORMAL HIGH (ref 65–99)
Glucose-Capillary: 172 mg/dL — ABNORMAL HIGH (ref 65–99)
Glucose-Capillary: 84 mg/dL (ref 65–99)
Glucose-Capillary: 93 mg/dL (ref 65–99)

## 2017-01-31 LAB — CBC
HCT: 30.1 % — ABNORMAL LOW (ref 39.0–52.0)
HEMOGLOBIN: 9.7 g/dL — AB (ref 13.0–17.0)
MCH: 28.3 pg (ref 26.0–34.0)
MCHC: 32.2 g/dL (ref 30.0–36.0)
MCV: 87.8 fL (ref 78.0–100.0)
Platelets: 249 10*3/uL (ref 150–400)
RBC: 3.43 MIL/uL — AB (ref 4.22–5.81)
RDW: 14.6 % (ref 11.5–15.5)
WBC: 7.1 10*3/uL (ref 4.0–10.5)

## 2017-01-31 MED ORDER — POTASSIUM CHLORIDE 10 MEQ/100ML IV SOLN
10.0000 meq | INTRAVENOUS | Status: AC
  Administered 2017-01-31 (×5): 10 meq via INTRAVENOUS
  Filled 2017-01-31 (×5): qty 100

## 2017-01-31 MED ORDER — FUROSEMIDE 10 MG/ML IJ SOLN
40.0000 mg | Freq: Two times a day (BID) | INTRAMUSCULAR | Status: AC
  Administered 2017-01-31 (×2): 40 mg via INTRAVENOUS
  Filled 2017-01-31 (×2): qty 4

## 2017-01-31 MED ORDER — INSULIN GLARGINE 100 UNIT/ML ~~LOC~~ SOLN
15.0000 [IU] | Freq: Two times a day (BID) | SUBCUTANEOUS | Status: DC
  Administered 2017-01-31 – 2017-02-02 (×3): 15 [IU] via SUBCUTANEOUS
  Filled 2017-01-31 (×8): qty 0.15

## 2017-01-31 MED ORDER — SODIUM CHLORIDE 0.9 % IV SOLN
1.5000 g | Freq: Four times a day (QID) | INTRAVENOUS | Status: AC
  Administered 2017-01-31 – 2017-02-07 (×31): 1.5 g via INTRAVENOUS
  Filled 2017-01-31 (×34): qty 1.5

## 2017-01-31 NOTE — Plan of Care (Signed)
  Progressing Education: Knowledge of General Education information will improve 01/31/2017 1810 - Progressing by Burna SisZavaleta Catalan, Abbe Bula G, RN Clinical Measurements: Ability to maintain clinical measurements within normal limits will improve 01/31/2017 1810 - Progressing by Burna SisZavaleta Catalan, Olga Seyler G, RN Will remain free from infection 01/31/2017 1810 - Progressing by Burna SisZavaleta Catalan, Dayelin Balducci G, RN Diagnostic test results will improve 01/31/2017 1810 - Progressing by Burna SisZavaleta Catalan, Egan Sahlin G, RN Respiratory complications will improve 01/31/2017 1810 - Progressing by Burna SisZavaleta Catalan, Aleysia Oltmann G, RN Cardiovascular complication will be avoided 01/31/2017 1810 - Progressing by Burna SisZavaleta Catalan, Jenice Leiner G, RN Activity: Risk for activity intolerance will decrease 01/31/2017 1810 - Progressing by Burna SisZavaleta Catalan, Tyeshia Cornforth G, RN Nutrition: Adequate nutrition will be maintained 01/31/2017 1810 - Progressing by Burna SisZavaleta Catalan, Garwood Wentzell G, RN Coping: Level of anxiety will decrease 01/31/2017 1810 - Progressing by Burna SisZavaleta Catalan, Colie Josten G, RN Elimination: Will not experience complications related to bowel motility 01/31/2017 1810 - Progressing by Burna SisZavaleta Catalan, Tammi Boulier G, RN Will not experience complications related to urinary retention 01/31/2017 1810 - Progressing by Burna SisZavaleta Catalan, Evangela Heffler G, RN Pain Managment: General experience of comfort will improve 01/31/2017 1810 - Progressing by Burna SisZavaleta Catalan, Woodie Degraffenreid G, RN Safety: Ability to remain free from injury will improve 01/31/2017 1810 - Progressing by Burna SisZavaleta Catalan, Tashonda Pinkus G, RN Skin Integrity: Risk for impaired skin integrity will decrease 01/31/2017 1810 - Progressing by Burna SisZavaleta Catalan, Susannah Carbin G, RN Activity: Ability to tolerate increased activity will improve 01/31/2017 1810 - Progressing by Burna SisZavaleta Catalan, Shima Compere G, RN Respiratory: Ability to maintain a clear airway and adequate ventilation will improve 01/31/2017 1810 -  Progressing by Burna SisZavaleta Catalan, Elias Bordner G, RN Role Relationship: Method of communication will improve 01/31/2017 1810 - Progressing by Burna SisZavaleta Catalan, Sahvannah Rieser G, RN Education: Ability to describe self-care measures that may prevent or decrease complications (Diabetes Survival Skills Education) will improve 01/31/2017 1810 - Progressing by Burna SisZavaleta Catalan, Jadon Harbaugh G, RN Health Behavior/Discharge Planning: Ability to identify and utilize available resources and services will improve 01/31/2017 1810 - Progressing by Burna SisZavaleta Catalan, Faaris Arizpe G, RN Ability to manage health-related needs will improve 01/31/2017 1810 - Progressing by Burna SisZavaleta Catalan, Otha Monical G, RN Metabolic: Ability to maintain appropriate glucose levels will improve 01/31/2017 1810 - Progressing by Burna SisZavaleta Catalan, Katalin Colledge G, RN Skin Integrity: Risk for impaired skin integrity will decrease 01/31/2017 1810 - Progressing by Burna SisZavaleta Catalan, Austin Herd G, RN   Not Progressing Health Behavior/Discharge Planning: Ability to manage health-related needs will improve 01/31/2017 1810 - Not Progressing by Burna SisZavaleta Catalan, Jase Himmelberger G, RN Fluid Volume: Ability to maintain a balanced intake and output will improve 01/31/2017 1810 - Not Progressing by Burna SisZavaleta Catalan, Patra Gherardi G, RN Nutritional: Maintenance of adequate nutrition will improve 01/31/2017 1810 - Not Progressing by Burna SisZavaleta Catalan, Navah Grondin G, RN Progress toward achieving an optimal weight will improve 01/31/2017 1810 - Not Progressing by Burna SisZavaleta Catalan, Aitan Rossbach G, RN

## 2017-01-31 NOTE — Progress Notes (Signed)
HYDROTHERAPY TREATMENT     01/31/17 1500  Subjective Assessment  Subjective I'm okay  Patient and Family Stated Goals wounds healed  Date of Onset (unknown-POA)  Prior Treatments dressing changes/nursing  Evaluation and Treatment  Evaluation and Treatment Procedures Explained to Patient/Family Yes  Evaluation and Treatment Procedures agreed to  Pressure Injury 01/27/17 Deep Tissue Injury - Purple or maroon localized area of discolored intact skin or blood-filled blister due to damage of underlying soft tissue from pressure and/or shear. PT  Date First Assessed/Time First Assessed: 01/27/17 1600   Location: Coccyx  Staging: Deep Tissue Injury - Purple or maroon localized area of discolored intact skin or blood-filled blister due to damage of underlying soft tissue from pressure and/or she...  Dressing Type Moist to dry;Foam (Santyl)  Dressing Changed (soiled)  Dressing Change Frequency Daily  State of Healing Eschar  Site / Wound Assessment Black;Yellow;Pink  % Wound base Red or Granulating 10%  % Wound base Yellow/Fibrinous Exudate 30%  % Wound base Black/Eschar 60%  Peri-wound Assessment Erythema (non-blanchable);Excoriated (possible moisture associated dermatitis)  Margins Unattached edges (unapproximated)  Drainage Amount Minimal  Drainage Description No odor  Treatment Hydrotherapy (Pulse lavage);Packing (Saline gauze) (Santyl)  Hydrotherapy  Pulsed lavage therapy - wound location coccyx/sacrum and supra-gluteal fold and r side of gluteal fold near ischium  Pulsed Lavage with Suction (psi) 8 psi  Pulsed Lavage with Suction - Normal Saline Used 1000 mL  Pulsed Lavage Tip Tip with splash shield  Selective Debridement  Selective Debridement - Tissue Removed none this session. Attempted to cross-hatch eschar prior to Santyl application  Wound Therapy - Assess/Plan/Recommendations  Wound Therapy - Clinical Statement 52 yo male  admitted after apparently not  moving, eating  drinking for several days, developed pressure injury(multiple areas). Admitted with respiratory distress requiring ventilator. The patient will benefit from Hydrotherapy wound treatment to increase healing and allow mobility.  Wound Therapy - Functional Problem List limited mobility d/t global deconditioning  Factors Delaying/Impairing Wound Healing Incontinence;Immobility;Multiple medical problems  Hydrotherapy Plan Debridement;Dressing change;Pulsatile lavage with suction;Patient/family education  Wound Therapy - Frequency 6X / week  Wound Therapy - Current Recommendations PT;OT  Wound Therapy - Follow Up Recommendations Skilled nursing facility  Wound Plan PLS and dressing change  Wound Therapy Goals - Improve the function of patient's integumentary system by progressing the wound(s) through the phases of wound healing by:  Decrease Necrotic Tissue to 50  Decrease Necrotic Tissue - Progress Progressing toward goal  Increase Granulation Tissue to 50  Increase Granulation Tissue - Progress Progressing toward goal  Patient/Family will be able to  off load sacrum  Patient/Family Instruction Goal - Progress Progressing toward goal  Goals/treatment plan/discharge plan were made with and agreed upon by patient/family Yes  Time For Goal Achievement 2 weeks  Wound Therapy - Potential for Goals Fair    Rebeca AlertJannie Mareli Antunes, MPT [phone]

## 2017-01-31 NOTE — Progress Notes (Signed)
[facility] PROGRESS NOTE    Progress Note  [name]  XIH:[id number] DOB: May 27, 1965 DOA: 01/18/2017 PCP: [name], MD     Brief Narrative:   [name] is an 52 y.o. male with a history of obesity, diabetes, hypertension, hyperlipidemia, obstructive sleep apnea, atrial fib/flutter with a history of prior cardioversion.  He was brought in by EMS and was noted to be hyperglycemic, encephalopathic, hypotensive.   found to be in DKA and sepsis due to pneumonia. Due to overall decline, patient was intubated for airway protection on 12/19.  He was admitted to ICU.  [facility] assumed care 12/30.  Assessment/Plan:   Acute respiratory failure with hypoxia due to pneumonia and  And ARDS: Intubated on 01/18/2017, then extubated on 01/26/2017, now on BiPAP. Physical therapy evaluated the patient the recommended skilled nursing facility versus LTAC. Continue IV Lasix.  Septic shock secondary to community-acquired pneumonia: Now off pressors, sputum cultures with moderate yeast. Blood cultures 1 out of 2 showed contaminant. He has completed his antibiotic. Has remained afebrile.  Acute encephalopathy: Continues to improve.  Acute kidney injury: Likely due to prerenal etiology. Now at baseline.  Diabetic ketoacidosis with coma associated with type 2 diabetes mellitus ([place]) On admission his blood sugar was 800 with a gap and a pH of 7.1. He was started on IV insulin and IV fluids his blood glucose is now controlled long-acting insulin plus sliding scale.  Hyponatremia Likely due to poor oral oral intake resolved with IV fluid.  Hypokalemia: Repleted orally now resolved.  Dysphagia: Speech evaluated the patient last time on 01/27/2017 and recommended n.p.o. ice chips as needed. Formal swallowing evaluation showed moderate to severe dysphagia, they recommended to keep the patient n.p.o.   Malnutrition of moderate degree Awaiting formal speech  evaluation.  New aspiration pneumonia: Overnight he desatted an ABG was done this showed 7.5/30 2/66 he had to be placed on a high flow nasal cannula as he refused BiPAP he does not want to be intubated, new chest x-ray showed enlarged right lower lobe opacity. He has remained afebrile, will start empirically IV Unasyn. Swallowing evaluation was done that showed him to be high risk for aspiration. We will consult palliative.  Hypocalcemia: We will check a hepatic function panel so we can correct the calcium for probable low albumin.   DVT prophylaxis: Lovenox Family Communication:none Disposition Plan/Barrier to D/C: SNF vs LTAC Code Status:     Code Status Orders  (From admission, onward)        Start     Ordered   01/18/17 1735  Full code  Continuous     01/18/17 1735    Code Status History    Date Active Date Inactive Code Status Order ID Comments User Context   This patient has a current code status but no historical code status.        IV Access:    Peripheral IV   Procedures and diagnostic studies:   Dg Chest Port 1 View  Result Date: 01/30/2017 CLINICAL DATA:  52 y/o  M; acute onset shortness of breath. EXAM: PORTABLE CHEST 1 VIEW COMPARISON:  01/30/2017 chest radiograph FINDINGS: Increasing opacities in the right greater than left lung base. Stable cardiac silhouette. No acute osseous abnormality is evident. Probable small right effusion. IMPRESSION: Increasing opacities in right greater than left lung bases, likely atelectasis and/or pneumonia. Probable small right effusion. Electronically Signed   By: Kristine Garbe M.D.   On: 01/30/2017 23:00  Dg Chest Port 1 View  Result Date: 01/30/2017 CLINICAL DATA:  Respiratory failure. History of pneumonia, diabetes, and sepsis EXAM: PORTABLE CHEST 1 VIEW COMPARISON:  Portable chest x-ray of January 25, 2017 FINDINGS: There is persistent volume loss on the right with increased density in the middle and  lower lobes. This has improved slightly since yesterday's study. Interstitial density in the infrahilar region on the left is stable. There is no large pleural effusion. The heart is normal in size. The pulmonary vascularity is not engorged. The bony thorax exhibits no acute abnormality. IMPRESSION: Slight improvement in right basilar atelectasis or pneumonia. Stable atelectasis or pneumonia at the left lung base. Electronically Signed   By: David  Martinique M.D.   On: 01/30/2017 08:39   Dg Swallowing Func-speech Pathology  Result Date: 01/30/2017 Objective Swallowing Evaluation: Type of Study: MBS-Modified Barium Swallow Study  Patient Details Name: [name] MRN: [id number] Date of Birth: [birth year] Today's Date: 01/30/2017 Time: SLP Start Time (ACUTE ONLY): 1326 -SLP Stop Time (ACUTE ONLY): 1350 SLP Time Calculation (min) (ACUTE ONLY): 24 min Past Medical History: Past Medical History: Diagnosis Date . Atrial fib/flutter, transient 09/19/2011 . Elevated cholesterol with elevated triglycerides  . Hypertension  . Kidney stones  . Pancreatitis 1999 . Pneumonia ~ 2003 . Sleep apnea   nO OFFICALLY DIAGNOSIED, SNORES LOUDLY AND WIFE HAS WITNESSED APNEA WHEN HE IS SLEEPING . Type II diabetes mellitus ([place])  Past Surgical History: Past Surgical History: Procedure Laterality Date . CARDIOVERSION  09/30/2011  Procedure: CARDIOVERSION;  Surgeon: Josue Hector, MD;  Location: [facility] ENDOSCOPY;  Service: Cardiovascular;  Laterality: N/A; . EYE SURGERY   . PARS PLANA VITRECTOMY  12/08/2011  left . PARS PLANA VITRECTOMY  12/08/2011  Procedure: PARS PLANA VITRECTOMY WITH 25 GAUGE;  Surgeon: Hayden Pedro, MD;  Location: [place];  Service: Ophthalmology;  Laterality: Left; . TEE WITHOUT CARDIOVERSION  09/30/2011  Procedure: TRANSESOPHAGEAL ECHOCARDIOGRAM (TEE);  Surgeon: Josue Hector, MD;  Location: [facility] ENDOSCOPY;  Service: Cardiovascular;  Laterality: N/A;  kristine/ebp/beverly ( or scheduling)/ time rescheduled from 1300 to 1200  talked ( mary) [name] adm to [facility] with weakness, decreased appetite and respiratory difficulites.  Pt required intubation from 12/19-12/27.  PMH + for HONK, pancreatitis, sleep apnea,   Swallow evaluation ordered. Pt unable to have MBS on Friday 12/28 due to respiratory issues.  Today is appropriate for MBS per RN.   Subjective: pt awake in bed Assessment / Plan / Recommendation CHL IP CLINICAL IMPRESSIONS 01/30/2017 Clinical Impression Pt presents with moderate oral and severe pharyngeal dysphagia with sensorimotor deficits.  His laryngeal elevation/closure is impaired results in severe residuals (pyriform more than valleculae) and SILENT trace aspiration/penetration of EVERY consistency tested *thin, nectar, honey and pudding.  Pt has NO sensation to gross residuals nor aspiration and cued cough did not clear aspirates.  Chin tuck posture, head turn right *side pt was leaning to*, cued dry swallows and cued strong cough not helpful to prevent or clear aspirates.  In addition, pt oxygen saturation dropped to 80s - only coming up with max cues for breaths.  He was also internally distracted as he complained of discomfort with urination attempts and repeated requested for head of chair to be lowered.  This impacted his participation during the session. If pt is to have po diet = it should be with known aspiration risk vs NPO x ice chips and oral care.  Hopeful for pt to improve with medical advancement but concerning  for possible premorbid dysphagia/aspiration. Will advise MD to finding. Will follow up February 01, 2017 for dysphagia treatment/management.  SLP Visit Diagnosis Dysphagia, oropharyngeal phase (R13.12);Dysphagia, pharyngoesophageal phase (R13.14) Attention and concentration deficit following -- Frontal lobe and executive function deficit following -- Impact on safety and function Severe aspiration risk;Risk for inadequate nutrition/hydration   CHL IP TREATMENT RECOMMENDATION 01/30/2017 Treatment  Recommendations Therapy as outlined in treatment plan below   Prognosis 01/30/2017 Prognosis for Safe Diet Advancement Fair Barriers to Reach Goals Cognitive deficits;Severity of deficits;Motivation Barriers/Prognosis Comment -- CHL IP DIET RECOMMENDATION 01/30/2017 SLP Diet Recommendations NPO;Ice chips PRN after oral care Liquid Administration via -- Medication Administration Via alternative means Compensations -- Postural Changes --   CHL IP OTHER RECOMMENDATIONS 01/30/2017 Recommended Consults -- Oral Care Recommendations Oral care QID Other Recommendations --   CHL IP FOLLOW UP RECOMMENDATIONS 01/30/2017 Follow up Recommendations Skilled Nursing facility   [facility] IP FREQUENCY AND DURATION 01/30/2017 Speech Therapy Frequency (ACUTE ONLY) min 2x/week Treatment Duration 2 weeks      CHL IP ORAL PHASE 01/30/2017 Oral Phase Impaired Oral - Pudding Teaspoon -- Oral - Pudding Cup -- Oral - Honey Teaspoon Reduced posterior propulsion Oral - Honey Cup -- Oral - Nectar Teaspoon Reduced posterior propulsion Oral - Nectar Cup Reduced posterior propulsion Oral - Nectar Straw Reduced posterior propulsion Oral - Thin Teaspoon Reduced posterior propulsion Oral - Thin Cup Reduced posterior propulsion Oral - Thin Straw -- Oral - Puree Reduced posterior propulsion Oral - Mech Soft -- Oral - Regular -- Oral - Multi-Consistency -- Oral - Pill -- Oral Phase - Comment --  CHL IP PHARYNGEAL PHASE 01/30/2017 Pharyngeal Phase Impaired Pharyngeal- Pudding Teaspoon -- Pharyngeal -- Pharyngeal- Pudding Cup -- Pharyngeal -- Pharyngeal- Honey Teaspoon Reduced pharyngeal peristalsis;Reduced epiglottic inversion;Reduced anterior laryngeal mobility;Reduced laryngeal elevation;Reduced airway/laryngeal closure;Reduced tongue base retraction;Penetration/Aspiration during swallow;Penetration/Apiration after swallow;Trace aspiration;Pharyngeal residue - valleculae;Pharyngeal residue - pyriform Pharyngeal Material enters airway, passes BELOW cords  without attempt by patient to eject out (silent aspiration) Pharyngeal- Honey Cup -- Pharyngeal -- Pharyngeal- Nectar Teaspoon Reduced pharyngeal peristalsis;Reduced epiglottic inversion;Reduced anterior laryngeal mobility;Reduced laryngeal elevation;Reduced airway/laryngeal closure;Reduced tongue base retraction;Pharyngeal residue - valleculae;Pharyngeal residue - pyriform;Penetration/Aspiration before swallow;Penetration/Apiration after swallow;Moderate aspiration Pharyngeal Material enters airway, passes BELOW cords without attempt by patient to eject out (silent aspiration) Pharyngeal- Nectar Cup -- Pharyngeal -- Pharyngeal- Nectar Straw Reduced pharyngeal peristalsis;Reduced epiglottic inversion;Reduced anterior laryngeal mobility;Reduced laryngeal elevation;Reduced airway/laryngeal closure;Reduced tongue base retraction;Moderate aspiration;Penetration/Aspiration during swallow;Penetration/Apiration after swallow Pharyngeal Material enters airway, passes BELOW cords without attempt by patient to eject out (silent aspiration) Pharyngeal- Thin Teaspoon Reduced pharyngeal peristalsis;Reduced epiglottic inversion;Reduced anterior laryngeal mobility;Reduced laryngeal elevation;Reduced airway/laryngeal closure;Reduced tongue base retraction;Pharyngeal residue - valleculae;Pharyngeal residue - pyriform Pharyngeal -- Pharyngeal- Thin Cup Reduced laryngeal elevation;Reduced anterior laryngeal mobility;Reduced epiglottic inversion;Reduced pharyngeal peristalsis;Reduced airway/laryngeal closure;Reduced tongue base retraction;Pharyngeal residue - valleculae;Pharyngeal residue - pyriform;Moderate aspiration;Penetration/Aspiration during swallow;Penetration/Apiration after swallow Pharyngeal Material enters airway, passes BELOW cords without attempt by patient to eject out (silent aspiration) Pharyngeal- Thin Straw -- Pharyngeal -- Pharyngeal- Puree Pharyngeal residue - pyriform;Pharyngeal residue - valleculae;Reduced  pharyngeal peristalsis;Reduced epiglottic inversion;Reduced anterior laryngeal mobility;Reduced laryngeal elevation;Reduced airway/laryngeal closure;Reduced tongue base retraction;Penetration/Apiration after swallow Pharyngeal Material enters airway, passes BELOW cords without attempt by patient to eject out (silent aspiration) Pharyngeal- Mechanical Soft -- Pharyngeal -- Pharyngeal- Regular -- Pharyngeal -- Pharyngeal- Multi-consistency -- Pharyngeal -- Pharyngeal- Pill -- Pharyngeal -- Pharyngeal Comment --  CHL IP CERVICAL ESOPHAGEAL PHASE 01/30/2017 Cervical Esophageal Phase Impaired Pudding Teaspoon -- Pudding Cup -- Honey Teaspoon --  Honey Cup -- Nectar Teaspoon -- Nectar Cup -- Nectar Straw -- Thin Teaspoon -- Thin Cup -- Thin Straw -- Puree -- Mechanical Soft -- Regular -- Multi-consistency -- Pill -- Cervical Esophageal Comment appearance of decreased clearance at distal esophagus without pt awareness;  pt's esophagus appeared dilated - radiologist not present to confirm No flowsheet data found. Luanna Salk, [facility] SLP [phone]                Medical Consultants:    None.  Anti-Infectives:   none  Subjective:    [name] had an episode of hypoxia overnight, was also agitated.  Objective:    Vitals:   01/31/17 0100 01/31/17 0200 01/31/17 0400 01/31/17 0500  BP: (!) 101/58 129/71    Pulse: 96 (!) 105    Resp: (!) 24 (!) 26    Temp:   98.2 F (36.8 C)   TempSrc:   Axillary   SpO2: 96% 96%    Weight:    81.2 kg (179 lb 0.2 oz)  Height:        Intake/Output Summary (Last 24 hours) at 01/31/2017 0713 Last data filed at 01/30/2017 1700 Gross per 24 hour  Intake 0 ml  Output 1500 ml  Net -1500 ml   Filed Weights   01/28/17 0600 01/30/17 0600 01/31/17 0500  Weight: 85.6 kg (188 lb 11.4 oz) 86.2 kg (190 lb 0.6 oz) 81.2 kg (179 lb 0.2 oz)    Exam: General exam: In no acute distress. Respiratory system: Good air movement and clear to auscultation. Cardiovascular  system: S1 & S2 heard, RRR. +JVD Gastrointestinal system: Abdomen is nondistended, soft and nontender.  Central nervous system: Alert and oriented. No focal neurological deficits. Extremities: No pedal edema. Skin: No rashes, lesions or ulcers   Data Reviewed:    Labs: Basic Metabolic Panel: Recent Labs  Lab 01/24/17 0928 01/24/17 1715 01/25/17 0506 01/25/17 1702  01/26/17 1642  01/28/17 0311 01/28/17 1939 01/29/17 5027 01/30/17 0323 01/31/17 0302  NA  --   --  153*  --    < > 159*   < > 155* 151* 146* 140 140  K  --   --  2.8*  --    < > 4.5   < > 3.0* 3.1* 3.6 3.0* 3.7  CL  --   --  117*  --    < > 124*   < > 121* 116* 113* 108 108  CO2  --   --  26  --    < > 29   < > _0 GLUCOSE  --   --  304*  --    < > 164*   < > 145* 115* 148* 130* 233*  BUN  --   --  64*  --    < > 50*   < > 30* 22* _1 CREATININE  --   --  2.47*  --    < > 2.00*   < > 1.46* 1.24 1.18 1.02 1.07  CALCIUM  --   --  6.6*  --    < > 7.0*   < > 6.9* 6.9* 6.5* 6.4* 6.5*  MG 2.1 2.0 1.9 1.8  --  1.8  --   --   --   --   --   --   PHOS 3.7 3.7 4.1 3.4  --   --   --   --   --   --   --   --    < > =  values in this interval not displayed.   GFR Estimated Creatinine Clearance: 79.8 mL/min (by C-G formula based on SCr of 1.07 mg/dL). Liver Function Tests: Recent Labs  Lab 01/25/17 0506  AST 36  ALT 37  ALKPHOS 502*  BILITOT 0.9  PROT 5.4*  ALBUMIN 1.5*   No results for input(s): LIPASE, AMYLASE in the last 168 hours. No results for input(s): AMMONIA in the last 168 hours. Coagulation profile No results for input(s): INR, PROTIME in the last 168 hours.  CBC: Recent Labs  Lab 01/25/17 0506 01/26/17 0305 01/29/17 0608 01/30/17 0323  WBC 12.7* 14.6* 8.2 7.6  HGB 10.2* 10.0* 9.7* 9.9*  HCT 32.3* 31.3* 30.9* 31.2*  MCV 88.5 89.7 90.1 88.1  PLT 197 246 276 280   Cardiac Enzymes: No results for input(s): CKTOTAL, CKMB, CKMBINDEX, TROPONINI in the last 168 hours. BNP (last 3  results) No results for input(s): PROBNP in the last 8760 hours. CBG: Recent Labs  Lab 01/30/17 1200 01/30/17 1540 01/30/17 1910 01/30/17 2311 01/31/17 0309  GLUCAP 133* 111* 81 181* 226*   D-Dimer: No results for input(s): DDIMER in the last 72 hours. Hgb A1c: No results for input(s): HGBA1C in the last 72 hours. Lipid Profile: No results for input(s): CHOL, HDL, LDLCALC, TRIG, CHOLHDL, LDLDIRECT in the last 72 hours. Thyroid function studies: No results for input(s): TSH, T4TOTAL, T3FREE, THYROIDAB in the last 72 hours.  Invalid input(s): FREET3 Anemia work up: No results for input(s): VITAMINB12, FOLATE, FERRITIN, TIBC, IRON, RETICCTPCT in the last 72 hours. Sepsis Labs: Recent Labs  Lab 01/25/17 0506 01/26/17 0305 01/29/17 0608 01/30/17 0323  WBC 12.7* 14.6* 8.2 7.6   Microbiology No results found for this or any previous visit (from the past 240 hour(s)).   Medications:   . chlorhexidine gluconate (MEDLINE KIT)  15 mL Mouth Rinse BID  . collagenase   Topical Daily  . enoxaparin (LOVENOX) injection  80 mg Subcutaneous Q12H  . furosemide  20 mg Intravenous Q12H  . insulin aspart  0-20 Units Subcutaneous Q4H  . insulin glargine  10 Units Subcutaneous BID  . mouth rinse  15 mL Mouth Rinse QID   Continuous Infusions: . ampicillin-sulbactam (UNASYN) IV        LOS: 13 days   Charlynne Cousins  [facility] Pager [phone]  *Please refer to [email or web address], password [email or web address] to get updated schedule on who will round on this patient, as hospitalists switch teams weekly. If 7PM-7AM, please contact night-coverage at [email or web address], password [email or web address] for any overnight needs.  01/31/2017, 7:13 AM

## 2017-02-01 DIAGNOSIS — Z7189 Other specified counseling: Secondary | ICD-10-CM

## 2017-02-01 DIAGNOSIS — Z515 Encounter for palliative care: Secondary | ICD-10-CM

## 2017-02-01 LAB — GLUCOSE, CAPILLARY
GLUCOSE-CAPILLARY: 88 mg/dL (ref 65–99)
GLUCOSE-CAPILLARY: 153 mg/dL — AB (ref 65–99)
GLUCOSE-CAPILLARY: 123 mg/dL — AB (ref 65–99)
Glucose-Capillary: 107 mg/dL — ABNORMAL HIGH (ref 65–99)
Glucose-Capillary: 103 mg/dL — ABNORMAL HIGH (ref 65–99)
Glucose-Capillary: 65 mg/dL (ref 65–99)
Glucose-Capillary: 137 mg/dL — ABNORMAL HIGH (ref 65–99)

## 2017-02-01 LAB — BASIC METABOLIC PANEL
Anion gap: 8 (ref 5–15)
BUN: 6 mg/dL (ref 6–20)
CALCIUM: 6.8 mg/dL — AB (ref 8.9–10.3)
CO2: 26 mmol/L (ref 22–32)
Chloride: 110 mmol/L (ref 101–111)
Creatinine, Ser: 1.08 mg/dL (ref 0.61–1.24)
GFR calc Af Amer: >60 mL/min (ref >60)
Glucose, Bld: 106 mg/dL — ABNORMAL HIGH (ref 65–99)
Potassium: 3.8 mmol/L (ref 3.5–5.1)
Sodium: 144 mmol/L (ref 135–145)

## 2017-02-01 LAB — HEMOGLOBIN A1C
Hgb A1c MFr Bld: >15.5 % — ABNORMAL HIGH (ref 4.8–5.6)
Mean Plasma Glucose: >398 mg/dL

## 2017-02-01 MED ORDER — DEXTROSE 50 % IV SOLN
50.0000 mL | Freq: Once | INTRAVENOUS | Status: AC
Start: 2017-02-01 — End: 2017-02-01
  Administered 2017-02-01: 50 mL via INTRAVENOUS

## 2017-02-01 MED ORDER — DEXTROSE 50 % IV SOLN
INTRAVENOUS | Status: AC
  Filled 2017-02-01: qty 50

## 2017-02-01 MED ORDER — FUROSEMIDE 10 MG/ML IJ SOLN
40.0000 mg | Freq: Once | INTRAMUSCULAR | Status: AC
  Administered 2017-02-01: 40 mg via INTRAVENOUS
  Filled 2017-02-01: qty 4

## 2017-02-01 NOTE — Progress Notes (Signed)
SLP Cancellation Note  Patient Details Name: [name] MRN: [id number] DOB: 12/06/1965   Cancelled treatment:       Reason Eval/Treat Not Completed: Other (comment);Medical issues which prohibited therapy(not pt with worsening ABG, CXR findings and for palliative meeting today at 1430, will continue efforts)   [name], MS [facility] SLP [phone]

## 2017-02-01 NOTE — Progress Notes (Signed)
HYDROTHERAPY TREATMENT     02/01/17 1600  Subjective Assessment  Subjective agreeable to hydro tx  Patient and Family Stated Goals wounds healed  Date of Onset (unknown-POA)  Prior Treatments dressing changes/nursing  Evaluation and Treatment  Evaluation and Treatment Procedures Explained to Patient/Family Yes  Evaluation and Treatment Procedures agreed to  Pressure Injury 01/27/17 Deep Tissue Injury - Purple or maroon localized area of discolored intact skin or blood-filled blister due to damage of underlying soft tissue from pressure and/or shear. PT  Date First Assessed/Time First Assessed: 01/27/17 1600   Location: Coccyx  Staging: Deep Tissue Injury - Purple or maroon localized area of discolored intact skin or blood-filled blister due to damage of underlying soft tissue from pressure and/or she...  Dressing Type Gauze ;Foam (Santyl)  Dressing Changed  Dressing Change Frequency Daily  State of Healing Eschar  Site / Wound Assessment Pink;Yellow;Black  % Wound base Red or Granulating 10%  % Wound base Yellow/Fibrinous Exudate 30%  % Wound base Black/Eschar 60%  Peri-wound Assessment Erythema (non-blanchable);Excoriated (possible moisture associated dermatitis)  Margins Unattached edges (unapproximated)  Drainage Amount Minimal  Drainage Description No odor  Treatment Debridement (Selective);Hydrotherapy (Pulse lavage);Packing (Saline gauze) (Santyl)  Hydrotherapy  Pulsed lavage therapy - wound location coccyx/sacrum and supra-gluteal fold, r side of gluteal fold and R ischium  Pulsed Lavage with Suction (psi) 8 psi  Pulsed Lavage with Suction - Normal Saline Used 1000 mL  Pulsed Lavage Tip Tip with splash shield  Selective Debridement  Selective Debridement - Tissue Removed none this session. Eschar is adhered.  Wound Therapy - Assess/Plan/Recommendations  Wound Therapy - Clinical Statement 52 yo male admitted after apparently not  moving, eating/drinking for several days.  Developed pressure injury (multiple areas). Admitted with respiratory distress requiring ventilator. The patient will benefit from Hydrotherapy wound treatment to increase healing and improve mobility.  Wound Therapy - Functional Problem List limited mobility d/t global deconditioning  Factors Delaying/Impairing Wound Healing Incontinence;Immobility;Multiple medical problems  Hydrotherapy Plan Debridement;Dressing change;Pulsatile lavage with suction;Patient/family education  Wound Therapy - Frequency 6X / week  Wound Therapy - Current Recommendations PT;OT  Wound Therapy - Follow Up Recommendations Skilled nursing facility  Wound Plan PLS and dressing change  Wound Therapy Goals - Improve the function of patient's integumentary system by progressing the wound(s) through the phases of wound healing by:  Decrease Necrotic Tissue to 50  Decrease Necrotic Tissue - Progress Progressing toward goal  Increase Granulation Tissue to 50  Increase Granulation Tissue - Progress Progressing toward goal  Patient/Family will be able to  off load sacrum  Patient/Family Instruction Goal - Progress Progressing toward goal  Goals/treatment plan/discharge plan were made with and agreed upon by patient/family Yes  Time For Goal Achievement 2 weeks  Wound Therapy - Potential for Goals Fair   Rebeca AlertJannie Gaylin Osoria, MPT [phone]

## 2017-02-01 NOTE — Progress Notes (Signed)
No charge note:   Palliative consult received:   GOC meeting arranged with spouse for 2:30pm today.  Ocie BobKasie Jackquline Branca, AGNP-C Palliative Medicine  Please call Palliative Medicine team phone with any questions [phone]. For individual providers please see [facility].

## 2017-02-01 NOTE — Progress Notes (Signed)
Spoke to IKON Office SolutionsHailey RN who reports pt is better today - advised that if full treatment is desired - due to sensorimotor deficits with his dysphagia, pt would need repeat MBS prior to initiating diet and could be planned for tomorrow.    Thanks.  Donavan Burnetamara Khizar Fiorella, MS [facility] SLP [phone]

## 2017-02-01 NOTE — Consult Note (Signed)
Consultation Note Date: 02/01/2017   Patient Name: [name]  DOB: [birth year]  MRN: [id number]  Age / Sex: 52 y.o., male  PCP: Gregory Downing, MD Referring Physician: Charlynne Cousins, MD  Reason for Consultation: Establishing goals of care  HPI/Patient Profile: 52 y.o. male  with past medical history of DM, OSA, HTN, HLD, OSA, atrial fib/flutter admitted on 01/18/2017 with feeling weak and unwell. Workup revealed encephalopathy, DKA, RLL pneumonia, and sepsis requiring intubation and IV pressors.  He was extubated on 12/27. Respiratory status continued to wax and wane- declined bipap, intermittently desatting on NRB. SLP eval noting dysphagia, MBS showing aspiration of all textures. Receiving hydrotherapy for necrotic sacral decubits ulcer. New chest xray on 12/31 showed enlarged RLL opacity likely new aspiration pnuemonia, has not had progression of swallow function. Palliative medicine consulted for [place].  Clinical Assessment and Goals of Care:  I have reviewed medical records including EPIC notes, labs and imaging,  assessed the patient and then met at the bedside along with patient's son, spouse, and father to discuss diagnosis prognosis, GOC, EOL wishes, disposition and options.  I introduced Palliative Medicine as specialized medical care for people living with serious illness. It focuses on providing relief from the symptoms and stress of a serious illness. The goal is to improve quality of life for both the patient and the family.  We discussed a brief life review of the patient. He recently lost his job. After that he lost his insurance. Was not able to obtain diabetes medication. Got very depressed. Was not getting up out of his recliner. Not eating. Not managing his chronic illnesses.   As far as functional and nutritional status- prior to admission he was still independent with ADL's. He  was walking, but with difficulty. He was eating very little. Evidently he has had difficulty swallowing for over a year now. Has noticed some choking. Has lost a significant amount of weak.  We discussed their current illness and what it means in the larger context of their on-going co-morbidities.  Discussed new onset of pneumonia is setting of ongoing aspiration and failure of two MBS tests and need for another test. Discussed that patient is aspirating all intake and his own secretions and what that means and how that affects his recovery and future nutrition.  I attempted to elicit values and goals of care important to the patient. He is interested in seeing his son's [name] ceremony that he has helped him earn. His family is most important to him.    The difference between aggressive medical intervention and comfort care was explained.   Advanced directives, concepts specific to code status, artifical feeding and hydration, and rehospitalization were considered and discussed.  Questions and concerns were addressed.  The family was encouraged to call with questions or concerns.   Primary Decision Maker PATIENT and spouse- [name]    SUMMARY OF RECOMMENDATIONS -Patient severely depressed- ?psych consult for possible treatment recommendations- attempted IV Zyprexa but this is no longer available per pharmacy- ?question  patient's competency to make decisions in light of his depression -Consider placement of temporary NG tube for PO meds for nutrition and PO medication for depression -Patient and family desire full scope care including full code status for now -Patient with poor prognosis- last albumin levels 1.4 - poor predictor for recovery -PMT will continue to follow   Code Status/Advance Care Planning:  Full code  Palliative Prophylaxis:   Aspiration  Additional Recommendations (Limitations, Scope, Preferences):  Full Scope Treatment  Prognosis:    Unable to  determine  Discharge Planning: To Be Determined  Primary Diagnoses: Present on Admission: . Pneumonia . Diabetic ketoacidosis with coma associated with type 2 diabetes mellitus ([place])   I have reviewed the medical record, interviewed the patient and family, and examined the patient. The following aspects are pertinent.  Past Medical History:  Diagnosis Date  . Atrial fib/flutter, transient 09/19/2011  . Elevated cholesterol with elevated triglycerides   . Hypertension   . Kidney stones   . Pancreatitis 1999  . Pneumonia ~ 2003  . Sleep apnea    nO OFFICALLY DIAGNOSIED, SNORES LOUDLY AND WIFE HAS WITNESSED APNEA WHEN HE IS SLEEPING  . Type II diabetes mellitus ([place])    Social History   Socioeconomic History  . Marital status: Married    Spouse name: None  . Number of children: None  . Years of education: None  . Highest education level: None  Social Needs  . Financial resource strain: None  . Food insecurity - worry: None  . Food insecurity - inability: None  . Transportation needs - medical: None  . Transportation needs - non-medical: None  Occupational History  . None  Tobacco Use  . Smoking status: Never Smoker  . Smokeless tobacco: Never Used  Substance and Sexual Activity  . Alcohol use: Yes    Comment: 12/08/2011 "maybe 1-2 beers/month average"  . Drug use: No  . Sexual activity: Yes  Other Topics Concern  . None  Social History Narrative  . None   History reviewed. No pertinent family history. Scheduled Meds: . chlorhexidine gluconate (MEDLINE KIT)  15 mL Mouth Rinse BID  . collagenase   Topical Daily  . enoxaparin (LOVENOX) injection  80 mg Subcutaneous Q12H  . insulin aspart  0-20 Units Subcutaneous Q4H  . insulin glargine  15 Units Subcutaneous BID  . mouth rinse  15 mL Mouth Rinse QID   Continuous Infusions: . ampicillin-sulbactam (UNASYN) IV 1.5 g (02/01/17 1426)   PRN Meds:.acetaminophen **OR** acetaminophen, hydrALAZINE, iopamidol,  labetalol, morphine injection, ondansetron (ZOFRAN) IV Medications Prior to Admission:  Prior to Admission medications   Medication Sig Start Date End Date Taking? Authorizing Provider  Ascorbic Acid (VITAMIN C) 1000 MG tablet Take 500 mg by mouth daily.   Yes [provider]  aspirin EC 81 MG tablet Take 81 mg by mouth daily.   Yes [provider]  fexofenadine (ALLEGRA) 180 MG tablet Take 180 mg by mouth daily.   Yes [provider]  metFORMIN (GLUCOPHAGE) 850 MG tablet Take 850 mg by mouth daily.   Yes [provider]  Multiple Vitamin (MULTIVITAMIN WITH MINERALS) TABS tablet Take 1 tablet by mouth daily.   Yes [provider]  niacin 500 MG tablet Take 500 mg by mouth 2 (two) times daily with a meal.   Yes [provider]   No Known Allergies Review of Systems  Constitutional: Positive for activity change, fatigue and unexpected weight change.  HENT: Positive for trouble swallowing.  Respiratory: Positive for shortness of breath.   Psychiatric/Behavioral: Positive for dysphoric mood. Negative for self-injury and suicidal ideas.    Physical Exam  Cardiovascular: Normal rate.  Pulmonary/Chest: Effort normal.  Neurological:  Lethargic, oriented  Skin: There is pallor.  Psychiatric:  Flat affect  Nursing note and vitals reviewed.   Vital Signs: BP (!) 104/57   Pulse 85   Temp (!) 97.5 F (36.4 C)   Resp (!) 33   Ht 5' 9"  (1.753 m)   Wt 80.4 kg (177 lb 4 oz)   SpO2 97%   BMI 26.18 kg/m  Pain Assessment: No/denies pain POSS *See Group Information*: 2-Acceptable,Slightly drowsy, easily aroused Pain Score: 0-No pain   SpO2: SpO2: 97 % O2 Device:SpO2: 97 % O2 Flow Rate: .O2 Flow Rate (L/min): (S) 3 L/min  IO: Intake/output summary:   Intake/Output Summary (Last 24 hours) at 02/01/2017 1603 Last data filed at 02/01/2017 1426 Gross per 24 hour  Intake 200 ml  Output 4550 ml  Net -4350 ml    LBM: Last BM Date:  02/01/17 Baseline Weight: Weight: 99.8 kg (220 lb) Most recent weight: Weight: 80.4 kg (177 lb 4 oz)     Palliative Assessment/Data:     Thank you for this consult. Palliative medicine will continue to follow and assist as needed.   Time In: 1430 Time Out: 1700 Time Total: 150 minutes Prolong services billed: yes Greater than 50%  of this time was spent counseling and coordinating care related to the above assessment and plan.  Signed by: Mariana Kaufman, AGNP-C Palliative Medicine    Please contact Palliative Medicine Team phone at [phone] for questions and concerns.  For individual provider: See Shea Evans

## 2017-02-01 NOTE — Progress Notes (Signed)
[facility] PROGRESS NOTE    Progress Note  [name]  LFY:[id number] DOB: 1965-05-20 DOA: 01/18/2017 PCP: [name], MD     Brief Narrative:   [name] is an 52 y.o. male with a history of obesity, diabetes, hypertension, hyperlipidemia, obstructive sleep apnea, atrial fib/flutter with a history of prior cardioversion.  He was brought in by EMS and was noted to be hyperglycemic, encephalopathic, hypotensive.   found to be in DKA and sepsis due to pneumonia. Due to overall decline, patient was intubated for airway protection on 12/19.  He was admitted to ICU.  [facility] hospitalist assumed care 12/30.  Assessment/Plan:   Acute respiratory failure with hypoxia due to pneumonia and  And ARDS: Intubated on 01/18/2017, then extubated on 01/26/2017, now on BiPAP. Physical therapy evaluated the patient the recommended skilled nursing facility versus LTAC. Continue IV Lasix for an additional 24 hours check a basic metabolic panel.  Septic shock secondary to community-acquired pneumonia: Now off pressors, sputum cultures with moderate yeast. Blood cultures 1 out of 2 showed contaminant. He has completed his antibiotic. Has remained afebrile.  Acute encephalopathy: Continues to improve.  Acute kidney injury: Likely due to prerenal etiology. Now at baseline.  Diabetic ketoacidosis with coma associated with type 2 diabetes mellitus ([place]) On admission his blood sugar was 800 with a gap and a pH of 7.1. He was started on IV insulin and IV fluids his blood glucose is now controlled long-acting insulin plus sliding scale.  Hyponatremia Likely due to poor oral oral intake resolved with IV fluid.  Hypokalemia: Repleted orally now resolved.  Dysphagia: Speech evaluated the patient last time on 01/27/2017 and recommended n.p.o. ice chips as needed. Formal swallowing evaluation showed moderate to severe dysphagia, they recommended to keep the patient n.p.o.     Malnutrition of moderate degree   New aspiration pneumonia: Now on a nonrebreather has improved overnight, will continue IV Unasyn. Swallowing evaluation was done that showed him to be high risk for aspiration. Awaiting palliative care recommendations.  Hypocalcemia: Pseudo-hypocalcemia   DVT prophylaxis: Lovenox Family Communication:none Disposition Plan/Barrier to D/C: SNF vs LTAC Code Status:     Code Status Orders  (From admission, onward)        Start     Ordered   01/18/17 1735  Full code  Continuous     01/18/17 1735    Code Status History    Date Active Date Inactive Code Status Order ID Comments User Context   This patient has a current code status but no historical code status.        IV Access:    Peripheral IV   Procedures and diagnostic studies:   Dg Chest Port 1 View  Result Date: 01/30/2017 CLINICAL DATA:  52 y/o  M; acute onset shortness of breath. EXAM: PORTABLE CHEST 1 VIEW COMPARISON:  01/30/2017 chest radiograph FINDINGS: Increasing opacities in the right greater than left lung base. Stable cardiac silhouette. No acute osseous abnormality is evident. Probable small right effusion. IMPRESSION: Increasing opacities in right greater than left lung bases, likely atelectasis and/or pneumonia. Probable small right effusion. Electronically Signed   By: Kristine Garbe M.D.   On: 01/30/2017 23:00   Dg Chest Port 1 View  Result Date: 01/30/2017 CLINICAL DATA:  Respiratory failure. History of pneumonia, diabetes, and sepsis EXAM: PORTABLE CHEST 1 VIEW COMPARISON:  Portable chest x-ray of January 25, 2017 FINDINGS: There is persistent volume loss on the right with increased density in the  middle and lower lobes. This has improved slightly since yesterday's study. Interstitial density in the infrahilar region on the left is stable. There is no large pleural effusion. The heart is normal in size. The pulmonary vascularity is not engorged. The bony  thorax exhibits no acute abnormality. IMPRESSION: Slight improvement in right basilar atelectasis or pneumonia. Stable atelectasis or pneumonia at the left lung base. Electronically Signed   By: David  Martinique M.D.   On: 01/30/2017 08:39   Dg Swallowing Func-speech Pathology  Result Date: 01/30/2017 Objective Swallowing Evaluation: Type of Study: MBS-Modified Barium Swallow Study  Patient Details Name: [name] MRN: [id number] Date of Birth: 1965-03-14 Today's Date: 01/30/2017 Time: SLP Start Time (ACUTE ONLY): 1326 -SLP Stop Time (ACUTE ONLY): 1350 SLP Time Calculation (min) (ACUTE ONLY): 24 min Past Medical History: Past Medical History: Diagnosis Date . Atrial fib/flutter, transient 09/19/2011 . Elevated cholesterol with elevated triglycerides  . Hypertension  . Kidney stones  . Pancreatitis 1999 . Pneumonia ~ 2003 . Sleep apnea   nO OFFICALLY DIAGNOSIED, SNORES LOUDLY AND WIFE HAS WITNESSED APNEA WHEN HE IS SLEEPING . Type II diabetes mellitus ([place])  Past Surgical History: Past Surgical History: Procedure Laterality Date . CARDIOVERSION  09/30/2011  Procedure: CARDIOVERSION;  Surgeon: Josue Hector, MD;  Location: [facility] ENDOSCOPY;  Service: Cardiovascular;  Laterality: N/A; . EYE SURGERY   . PARS PLANA VITRECTOMY  12/08/2011  left . PARS PLANA VITRECTOMY  12/08/2011  Procedure: PARS PLANA VITRECTOMY WITH 25 GAUGE;  Surgeon: Hayden Pedro, MD;  Location: [facility];  Service: Ophthalmology;  Laterality: Left; . TEE WITHOUT CARDIOVERSION  09/30/2011  Procedure: TRANSESOPHAGEAL ECHOCARDIOGRAM (TEE);  Surgeon: Josue Hector, MD;  Location: [facility] ENDOSCOPY;  Service: Cardiovascular;  Laterality: N/A;  kristine/ebp/beverly ( or scheduling)/ time rescheduled from 1300 to 1200 talked ( mary) [name] adm to [facility] with weakness, decreased appetite and respiratory difficulites.  Pt required intubation from 12/19-12/27.  PMH + for HONK, pancreatitis, sleep apnea,   Swallow evaluation ordered. Pt unable to have MBS on  Friday 12/28 due to respiratory issues.  Today is appropriate for MBS per RN.   Subjective: pt awake in bed Assessment / Plan / Recommendation CHL IP CLINICAL IMPRESSIONS 01/30/2017 Clinical Impression Pt presents with moderate oral and severe pharyngeal dysphagia with sensorimotor deficits.  His laryngeal elevation/closure is impaired results in severe residuals (pyriform more than valleculae) and SILENT trace aspiration/penetration of EVERY consistency tested *thin, nectar, honey and pudding.  Pt has NO sensation to gross residuals nor aspiration and cued cough did not clear aspirates.  Chin tuck posture, head turn right *side pt was leaning to*, cued dry swallows and cued strong cough not helpful to prevent or clear aspirates.  In addition, pt oxygen saturation dropped to 80s - only coming up with max cues for breaths.  He was also internally distracted as he complained of discomfort with urination attempts and repeated requested for head of chair to be lowered.  This impacted his participation during the session. If pt is to have po diet = it should be with known aspiration risk vs NPO x ice chips and oral care.  Hopeful for pt to improve with medical advancement but concerning for possible premorbid dysphagia/aspiration. Will advise MD to finding. Will follow up February 01, 2017 for dysphagia treatment/management.  SLP Visit Diagnosis Dysphagia, oropharyngeal phase (R13.12);Dysphagia, pharyngoesophageal phase (R13.14) Attention and concentration deficit following -- Frontal lobe and executive function deficit following -- Impact on safety and function  Severe aspiration risk;Risk for inadequate nutrition/hydration   CHL IP TREATMENT RECOMMENDATION 01/30/2017 Treatment Recommendations Therapy as outlined in treatment plan below   Prognosis 01/30/2017 Prognosis for Safe Diet Advancement Fair Barriers to Reach Goals Cognitive deficits;Severity of deficits;Motivation Barriers/Prognosis Comment -- CHL IP DIET  RECOMMENDATION 01/30/2017 SLP Diet Recommendations NPO;Ice chips PRN after oral care Liquid Administration via -- Medication Administration Via alternative means Compensations -- Postural Changes --   CHL IP OTHER RECOMMENDATIONS 01/30/2017 Recommended Consults -- Oral Care Recommendations Oral care QID Other Recommendations --   CHL IP FOLLOW UP RECOMMENDATIONS 01/30/2017 Follow up Recommendations Skilled Nursing facility   [facility] IP FREQUENCY AND DURATION 01/30/2017 Speech Therapy Frequency (ACUTE ONLY) min 2x/week Treatment Duration 2 weeks      CHL IP ORAL PHASE 01/30/2017 Oral Phase Impaired Oral - Pudding Teaspoon -- Oral - Pudding Cup -- Oral - Honey Teaspoon Reduced posterior propulsion Oral - Honey Cup -- Oral - Nectar Teaspoon Reduced posterior propulsion Oral - Nectar Cup Reduced posterior propulsion Oral - Nectar Straw Reduced posterior propulsion Oral - Thin Teaspoon Reduced posterior propulsion Oral - Thin Cup Reduced posterior propulsion Oral - Thin Straw -- Oral - Puree Reduced posterior propulsion Oral - Mech Soft -- Oral - Regular -- Oral - Multi-Consistency -- Oral - Pill -- Oral Phase - Comment --  CHL IP PHARYNGEAL PHASE 01/30/2017 Pharyngeal Phase Impaired Pharyngeal- Pudding Teaspoon -- Pharyngeal -- Pharyngeal- Pudding Cup -- Pharyngeal -- Pharyngeal- Honey Teaspoon Reduced pharyngeal peristalsis;Reduced epiglottic inversion;Reduced anterior laryngeal mobility;Reduced laryngeal elevation;Reduced airway/laryngeal closure;Reduced tongue base retraction;Penetration/Aspiration during swallow;Penetration/Apiration after swallow;Trace aspiration;Pharyngeal residue - valleculae;Pharyngeal residue - pyriform Pharyngeal Material enters airway, passes BELOW cords without attempt by patient to eject out (silent aspiration) Pharyngeal- Honey Cup -- Pharyngeal -- Pharyngeal- Nectar Teaspoon Reduced pharyngeal peristalsis;Reduced epiglottic inversion;Reduced anterior laryngeal mobility;Reduced laryngeal  elevation;Reduced airway/laryngeal closure;Reduced tongue base retraction;Pharyngeal residue - valleculae;Pharyngeal residue - pyriform;Penetration/Aspiration before swallow;Penetration/Apiration after swallow;Moderate aspiration Pharyngeal Material enters airway, passes BELOW cords without attempt by patient to eject out (silent aspiration) Pharyngeal- Nectar Cup -- Pharyngeal -- Pharyngeal- Nectar Straw Reduced pharyngeal peristalsis;Reduced epiglottic inversion;Reduced anterior laryngeal mobility;Reduced laryngeal elevation;Reduced airway/laryngeal closure;Reduced tongue base retraction;Moderate aspiration;Penetration/Aspiration during swallow;Penetration/Apiration after swallow Pharyngeal Material enters airway, passes BELOW cords without attempt by patient to eject out (silent aspiration) Pharyngeal- Thin Teaspoon Reduced pharyngeal peristalsis;Reduced epiglottic inversion;Reduced anterior laryngeal mobility;Reduced laryngeal elevation;Reduced airway/laryngeal closure;Reduced tongue base retraction;Pharyngeal residue - valleculae;Pharyngeal residue - pyriform Pharyngeal -- Pharyngeal- Thin Cup Reduced laryngeal elevation;Reduced anterior laryngeal mobility;Reduced epiglottic inversion;Reduced pharyngeal peristalsis;Reduced airway/laryngeal closure;Reduced tongue base retraction;Pharyngeal residue - valleculae;Pharyngeal residue - pyriform;Moderate aspiration;Penetration/Aspiration during swallow;Penetration/Apiration after swallow Pharyngeal Material enters airway, passes BELOW cords without attempt by patient to eject out (silent aspiration) Pharyngeal- Thin Straw -- Pharyngeal -- Pharyngeal- Puree Pharyngeal residue - pyriform;Pharyngeal residue - valleculae;Reduced pharyngeal peristalsis;Reduced epiglottic inversion;Reduced anterior laryngeal mobility;Reduced laryngeal elevation;Reduced airway/laryngeal closure;Reduced tongue base retraction;Penetration/Apiration after swallow Pharyngeal Material enters  airway, passes BELOW cords without attempt by patient to eject out (silent aspiration) Pharyngeal- Mechanical Soft -- Pharyngeal -- Pharyngeal- Regular -- Pharyngeal -- Pharyngeal- Multi-consistency -- Pharyngeal -- Pharyngeal- Pill -- Pharyngeal -- Pharyngeal Comment --  CHL IP CERVICAL ESOPHAGEAL PHASE 01/30/2017 Cervical Esophageal Phase Impaired Pudding Teaspoon -- Pudding Cup -- Honey Teaspoon -- Honey Cup -- Nectar Teaspoon -- Nectar Cup -- Nectar Straw -- Thin Teaspoon -- Thin Cup -- Thin Straw -- Puree -- Mechanical Soft -- Regular -- Multi-consistency -- Pill -- Cervical Esophageal Comment appearance of decreased clearance at distal esophagus without pt awareness;  pt's  esophagus appeared dilated - radiologist not present to confirm No flowsheet data found. Luanna Salk, [place] [facility] SLP [phone]                Medical Consultants:    None.  Anti-Infectives:   none  Subjective:    KADYN GUILD no hypoxia overnight.  Objective:    Vitals:   02/01/17 0400 02/01/17 0500 02/01/17 0514 02/01/17 0753  BP: (!) 95/53  111/62   Pulse: 85  88   Resp: (!) 27  18   Temp:    99.1 F (37.3 C)  TempSrc:    Oral  SpO2: 97%  99%   Weight:  80.4 kg (177 lb 4 oz)    Height:        Intake/Output Summary (Last 24 hours) at 02/01/2017 0759 Last data filed at 02/01/2017 0500 Gross per 24 hour  Intake 100 ml  Output 3575 ml  Net -3475 ml   Filed Weights   01/30/17 0600 01/31/17 0500 02/01/17 0500  Weight: 86.2 kg (190 lb 0.6 oz) 81.2 kg (179 lb 0.2 oz) 80.4 kg (177 lb 4 oz)    Exam: General exam: In no acute distress. Respiratory system: Good air movement and clear to auscultation. Cardiovascular system: S1 & S2 heard, RRR. -JVD Gastrointestinal system: Abdomen is nondistended, soft and nontender.  Central nervous system: Alert and oriented. No focal neurological deficits. Extremities: No pedal edema. Skin: No rashes, lesions or ulcers   Data Reviewed:    Labs: Basic Metabolic  Panel: Recent Labs  Lab 01/25/17 1702  01/26/17 1642  01/28/17 1939 01/29/17 4196 01/30/17 0323 01/31/17 0302 02/01/17 0354  NA  --    < > 159*   < > 151* 146* 140 140 144  K  --    < > 4.5   < > 3.1* 3.6 3.0* 3.7 3.8  CL  --    < > 124*   < > 116* 113* 108 108 110  CO2  --    < > 29   < > _0 GLUCOSE  --    < > 164*   < > 115* 148* 130* 233* 106*  BUN  --    < > 50*   < > 22* _1 CREATININE  --    < > 2.00*   < > 1.24 1.18 1.02 1.07 1.08  CALCIUM  --    < > 7.0*   < > 6.9* 6.5* 6.4* 6.5* 6.8*  MG 1.8  --  1.8  --   --   --   --   --   --   PHOS 3.4  --   --   --   --   --   --   --   --    < > = values in this interval not displayed.   GFR Estimated Creatinine Clearance: 79.1 mL/min (by C-G formula based on SCr of 1.08 mg/dL). Liver Function Tests: Recent Labs  Lab 01/31/17 0726  AST 42*  ALT 28  ALKPHOS 422*  BILITOT 0.5  PROT 6.7  ALBUMIN 1.4*   No results for input(s): LIPASE, AMYLASE in the last 168 hours. No results for input(s): AMMONIA in the last 168 hours. Coagulation profile No results for input(s): INR, PROTIME in the last 168 hours.  CBC: Recent Labs  Lab 01/26/17 0305 01/29/17 0608 01/30/17 0323 01/31/17 0726  WBC 14.6* 8.2 7.6 7.1  HGB  10.0* 9.7* 9.9* 9.7*  HCT 31.3* 30.9* 31.2* 30.1*  MCV 89.7 90.1 88.1 87.8  PLT 246 276 280 249   Cardiac Enzymes: No results for input(s): CKTOTAL, CKMB, CKMBINDEX, TROPONINI in the last 168 hours. BNP (last 3 results) No results for input(s): PROBNP in the last 8760 hours. CBG: Recent Labs  Lab 01/31/17 1506 01/31/17 1909 01/31/17 2314 02/01/17 0330 02/01/17 0720  GLUCAP 105* 84 93 88 107*   D-Dimer: No results for input(s): DDIMER in the last 72 hours. Hgb A1c: No results for input(s): HGBA1C in the last 72 hours. Lipid Profile: No results for input(s): CHOL, HDL, LDLCALC, TRIG, CHOLHDL, LDLDIRECT in the last 72 hours. Thyroid function studies: No results for input(s): TSH,  T4TOTAL, T3FREE, THYROIDAB in the last 72 hours.  Invalid input(s): FREET3 Anemia work up: No results for input(s): VITAMINB12, FOLATE, FERRITIN, TIBC, IRON, RETICCTPCT in the last 72 hours. Sepsis Labs: Recent Labs  Lab 01/26/17 0305 01/29/17 0608 01/30/17 0323 01/31/17 0726  WBC 14.6* 8.2 7.6 7.1   Microbiology No results found for this or any previous visit (from the past 240 hour(s)).   Medications:   . chlorhexidine gluconate (MEDLINE KIT)  15 mL Mouth Rinse BID  . collagenase   Topical Daily  . enoxaparin (LOVENOX) injection  80 mg Subcutaneous Q12H  . insulin aspart  0-20 Units Subcutaneous Q4H  . insulin glargine  15 Units Subcutaneous BID  . mouth rinse  15 mL Mouth Rinse QID   Continuous Infusions: . ampicillin-sulbactam (UNASYN) IV 1.5 g (02/01/17 0743)      LOS: 14 days   [facility] Pager [phone]  *Please refer to [email or web address], password [email or web address] to get updated schedule on who will round on this patient, as hospitalists switch teams weekly. If 7PM-7AM, please contact night-coverage at [email or web address], password [email or web address] for any overnight needs.  02/01/2017, 7:59 AM

## 2017-02-02 ENCOUNTER — Inpatient Hospital Stay (HOSPITAL_COMMUNITY): Payer: BC Managed Care – PPO

## 2017-02-02 DIAGNOSIS — F4321 Adjustment disorder with depressed mood: Secondary | ICD-10-CM

## 2017-02-02 DIAGNOSIS — Z515 Encounter for palliative care: Secondary | ICD-10-CM

## 2017-02-02 DIAGNOSIS — Z7189 Other specified counseling: Secondary | ICD-10-CM

## 2017-02-02 LAB — BASIC METABOLIC PANEL
ANION GAP: 12 (ref 5–15)
BUN: 17 mg/dL (ref 6–20)
CHLORIDE: 106 mmol/L (ref 101–111)
CO2: 24 mmol/L (ref 22–32)
CREATININE: 1.17 mg/dL (ref 0.61–1.24)
Calcium: 6.9 mg/dL — ABNORMAL LOW (ref 8.9–10.3)
GFR calc non Af Amer: >60 mL/min (ref >60)
Glucose, Bld: 153 mg/dL — ABNORMAL HIGH (ref 65–99)
POTASSIUM: 3.5 mmol/L (ref 3.5–5.1)
SODIUM: 142 mmol/L (ref 135–145)

## 2017-02-02 LAB — GLUCOSE, CAPILLARY
GLUCOSE-CAPILLARY: 141 mg/dL — AB (ref 65–99)
GLUCOSE-CAPILLARY: 134 mg/dL — AB (ref 65–99)
GLUCOSE-CAPILLARY: 66 mg/dL (ref 65–99)
GLUCOSE-CAPILLARY: 109 mg/dL — AB (ref 65–99)
Glucose-Capillary: 151 mg/dL — ABNORMAL HIGH (ref 65–99)
Glucose-Capillary: 72 mg/dL (ref 65–99)
Glucose-Capillary: 118 mg/dL — ABNORMAL HIGH (ref 65–99)

## 2017-02-02 MED ORDER — ENOXAPARIN SODIUM 40 MG/0.4ML ~~LOC~~ SOLN
40.0000 mg | Freq: Every day | SUBCUTANEOUS | Status: DC
  Administered 2017-02-03 – 2017-02-10 (×8): 40 mg via SUBCUTANEOUS
  Filled 2017-02-02 (×8): qty 0.4

## 2017-02-02 MED ORDER — DEXTROSE 50 % IV SOLN
INTRAVENOUS | Status: AC
  Administered 2017-02-02: 25 mL
  Filled 2017-02-02: qty 50

## 2017-02-02 MED ORDER — GERHARDT'S BUTT CREAM
TOPICAL_CREAM | Freq: Every day | CUTANEOUS | Status: DC
  Administered 2017-02-02 – 2017-02-07 (×6): via TOPICAL
  Filled 2017-02-02: qty 1

## 2017-02-02 MED ORDER — SODIUM CHLORIDE 0.45 % IV SOLN
INTRAVENOUS | Status: DC
  Administered 2017-02-02: 20:00:00 via INTRAVENOUS

## 2017-02-02 NOTE — Progress Notes (Signed)
HYDROTHERAPY TREATMENT     02/02/17 1500  Subjective Assessment  Subjective "okay"  Patient and Family Stated Goals wounds healed  Date of Onset (unknown-POA)  Prior Treatments dressing changes/nursing  Evaluation and Treatment  Evaluation and Treatment Procedures Explained to Patient/Family Yes  Evaluation and Treatment Procedures agreed to  Pressure Injury 01/27/17 Deep Tissue Injury - Purple or maroon localized area of discolored intact skin or blood-filled blister due to damage of underlying soft tissue from pressure and/or shear. PT  Date First Assessed/Time First Assessed: 01/27/17 1600   Location: Coccyx  Staging: Deep Tissue Injury - Purple or maroon localized area of discolored intact skin or blood-filled blister due to damage of underlying soft tissue from pressure and/or she...  Dressing Type Gauze;Foam;Moist to dry (Santyl)  Dressing (nursing removed just before tx)  Dressing Change Frequency Daily  State of Healing Eschar  Site / Wound Assessment Pink;Yellow;Black  % Wound base Red or Granulating 10%  % Wound base Yellow/Fibrinous Exudate 30%  % Wound base Black/Eschar 60%  Peri-wound Assessment Erythema (non-blanchable);Excoriated (possible moisture associated dermatitis)  Margins Unattached edges (unapproximated)  Drainage Amount Minimal  Drainage Description No odor  Treatment Hydrotherapy (Pulse lavage);Packing (Saline gauze);Debridement (Selective) (enzymatic debridement)  Hydrotherapy  Pulsed lavage therapy - wound location coccyx/sacrum and supra-gluteal fold, r side of gluteal fold and R ischium  Pulsed Lavage with Suction (psi) 8 psi  Pulsed Lavage with Suction - Normal Saline Used 1000 mL  Pulsed Lavage Tip Tip with splash shield  Wound Therapy - Assess/Plan/Recommendations  Wound Therapy - Clinical Statement 52 yo male admitted after apparently not  moving, eating/drinking for several days. Developed pressure injury (multiple areas). Admitted with  respiratory distress requiring ventilator. The patient will benefit from Hydrotherapy wound treatment to increase healing and improve mobility.  Wound Therapy - Functional Problem List limited mobility d/t global deconditioning  Factors Delaying/Impairing Wound Healing Incontinence;Immobility;Multiple medical problems  Hydrotherapy Plan Debridement;Dressing change;Pulsatile lavage with suction;Patient/family education  Wound Therapy - Frequency 6X / week  Wound Therapy - Current Recommendations PT;OT  Wound Therapy - Follow Up Recommendations Skilled nursing facility  Wound Plan PLS and dressing change  Wound Therapy Goals - Improve the function of patient's integumentary system by progressing the wound(s) through the phases of wound healing by:  Decrease Necrotic Tissue to 50  Decrease Necrotic Tissue - Progress Progressing toward goal  Increase Granulation Tissue to 50  Increase Granulation Tissue - Progress Progressing toward goal  Patient/Family will be able to  off load sacrum  Patient/Family Instruction Goal - Progress Progressing toward goal  Goals/treatment plan/discharge plan were made with and agreed upon by patient/family Yes  Time For Goal Achievement 2 weeks  Wound Therapy - Potential for Goals Fair   Rebeca AlertJannie Manuel [place], MPT [phone]

## 2017-02-02 NOTE — Progress Notes (Signed)
ANTICOAGULATION CONSULT NOTE - Follow up Consult  Pharmacy Consult for enoxaparin Indication: atrial fibrillation  No Known Allergies  Patient Measurements: Height: 5\' 9"  (175.3 cm) Weight: 177 lb 4 oz (80.4 kg) IBW/kg (Calculated) : 70.7  Vital Signs: Temp: 98.1 F (36.7 C) (01/03 0800) Temp Source: Oral (01/03 0800) BP: 104/51 (01/03 0800) Pulse Rate: 93 (01/03 0800)  Labs: Recent Labs    01/31/17 0302 01/31/17 0726 02/01/17 0354 02/02/17 0344  HGB  --  9.7*  --   --   HCT  --  30.1*  --   --   PLT  --  249  --   --   CREATININE 1.07  --  1.08 1.17    Estimated Creatinine Clearance: 73 mL/min (by C-G formula based on SCr of 1.17 mg/dL).  Assessment: Pharmacy is consulted to dose enoxaparin for 52 yo male with atrial fibrillation.  He was previously on Xarelto to Afib, but Per cards note from 10/06/16 he is s/p DCCV on 09/29/16 and was instructed to stop Xarelto on 10/30/16.  Today, 02/02/17   SCr 1.17, Cl ~ 80 ml/min  CBC:  Hgb 9.7 (1/1) stable, Plt WNL  No bleeding or complications reported.  Swallow eval 12/31: remains NPO, silent aspiration  Goal of Therapy:  Anti-Xa level 0.6-1 units/ml 4hrs after LMWH dose given Monitor platelets by anticoagulation protocol: Yes   Plan:   Continue Enoxaparin 80 mg SQ q12h  Monitor for signs and symptoms of bleeding  Follow up plans for oral anticoag, swallow eval, renal function.  Otho BellowsGreen, Aleph Nickson L PharmD Pager [phone] 02/02/2017, 9:22 AM

## 2017-02-02 NOTE — Progress Notes (Signed)
Modified Barium Swallow Progress Note  Patient Details  Name: Manley MasonKenneth D Guimont MRN: [id number] Date of Birth: 01/10/1966  Today's Date: 02/02/2017  Modified Barium Swallow completed.  Full report located under Chart Review in the Imaging Section.  Brief recommendations include the following:  Clinical Impression  Limited assessment due to pt stating "I feel like I"m going to give out" and becoming slightly ashen with oxygen desaturation after only a FEW boluses.  SLP assisted to return pt to bed and RTs transported pt to his room in ICU.  Pt reported feeling better after being returned to his bed and did not appear dyspneic.  Pt continues with moderate oral and severe pharyngo-cervical esophageal dysphagia.  Decreased timing and adequacy of laryngeal closure results in recurrent penetration and eventual aspiration (of penetrates) of all consistencies tested  - (nectar, thin).  Cued throat clearing moved some penetrates into pharynx but pt repenetrated with each swallow.  Aspiration of secretions mixed with barium occured consistently during testing without pt sensation.  Severe pharyngeal residuals continue despite head turn, multiple swallows, and chin tuck posture.    Pt does not clear residuals with dry swallows and was able to conduct dry swallows initially.  Once pt became fatigued after a few boluses ONLY he stated "That's all I got" and could not or did not dry swallow on command.  Using live video educated pt to findings/concerns. As pt without functional swallow improvement and aspiration of secretions noted - doubtful for functional swallow improvement in his acute stay - advised pt concerns.    Swallow Evaluation Recommendations       SLP Diet Recommendations: NPO;Ice chips PRN after oral care                             Donavan Burnetamara Tanairi Cypert, MS [facility] SLP [phone]   Donavan Burnetamara Cashius Grandstaff, MS [facility] SLP [phone]  [name], Zhana Jeangilles [name] 02/02/2017,3:39 PM

## 2017-02-02 NOTE — Progress Notes (Addendum)
PROGRESS NOTE    [name]  LHT:[id number] DOB: Apr 01, 1965 DOA: 01/18/2017 PCP: [name], MD  Brief Narrative 52 y.o. male with a history of obesity, diabetes, hypertension, hyperlipidemia, obstructive sleep apnea, atrial fib/flutter with a history of prior cardioversion. He was brought in by EMS and was noted to be hyperglycemic, encephalopathic, hypotensive.  found to be in DKA and sepsis due to pneumonia.Due to overall decline, patient was intubated for airway protection on 12/19. He was admitted to ICU. [facility] hospitalist assumed care 12/30.  02/02/2017-placed foley back in last night as he was not able to void.mbs today afternoon.remains npo.  Assessment & Plan:   Active Problems:   Diabetic ketoacidosis with coma associated with type 2 diabetes mellitus ([place])   Pneumonia   Malnutrition of moderate degree   Pressure injury of skin   Acute on chronic respiratory failure with hypoxia (HCC)   Severe sepsis with septic shock (HCC)   Type 2 diabetes mellitus with stage 3 chronic kidney disease (HCC)   Hypernatremia   Hypokalemia   Sacral decubitus ulcer, stage III (HCC)   ARDS (adult respiratory distress syndrome) (HCC)   Acute metabolic encephalopathy   AKI (acute kidney injury) ([place])   Adjustment disorder with depressed mood   Palliative care by specialist   Goals of care, counseling/discussion   Advance care planning Acute respiratory failure with hypoxia due to pneumonia and  And ARDS: Intubated on 01/18/2017, then extubated on 01/26/2017, now on BiPAP. Physical therapy evaluated the patient the recommended skilled nursing facility versus LTACchest xray 12/31-.Increasing opacities in right greater than left lung bases, likely atelectasis and/or pneumonia. Probable small right effusion.follow up chest xray today. will dc lasix.patient npo.follow up cxr  Septic shock secondary to community-acquired pneumonia: Now off pressors, sputum cultures with  moderate yeast. Blood cultures 1 out of 2 showed contaminant. He haD completed his antibiotic. New aspiration pneumonia: Now on a nonrebreather has improved overnight, will continue IV Unasyn. Swallowing evaluation was done that showed him to be high risk for aspiration. APPRECIATE PALLIATIVE CARE INPUT.  Acute encephalopathy: Continues to improve.  Acute kidney injury: Likely due to prerenal etiology. Now at baseline.  Diabetic ketoacidosis with coma associated with type 2 diabetes mellitus ([place]) On admission his blood sugar was 800 with a gap and a pH of 7.1. He was started on IV insulin and IV fluids his blood glucose is now controlled long-acting insulin plus sliding scale.  Dysphagia: Speech evaluated the patient last time on 01/27/2017 and recommended n.p.o. ice chips as needed. Formal swallowing evaluation showed moderate to severe dysphagia, they recommended to keep the patient n.p.o.patient to have MBS 02/02/2017.   History of afib- dccv 8/18.instructed to stop xarelto end of sep 2018.will decrease lovenox dose.   dvt prophylaxis LOVENOX Code StatusFULL Family Communication:NONE Disposition Plan: SNF VS LTAC  Consultants: PCCM.PALLIATIVE CARE  Procedures:  Antimicrobials:UNASYN Subjective: Feels breathing ok.had to put foley back in last night due to bladder distension  Objective:rsting in bed,in nad.answered all questions appropriately. Vitals:   02/01/17 2311 02/02/17 0000 02/02/17 0331 02/02/17 0800  BP:  128/86  (!) 104/51  Pulse:  99  93  Resp:  (!) 26  18  Temp: 97.8 F (36.6 C)  97.9 F (36.6 C) 98.1 F (36.7 C)  TempSrc: Oral  Oral Oral  SpO2:  97%  100%  Weight:      Height:        Intake/Output Summary (Last 24 hours) at 02/02/2017 1132 Last data  filed at 02/02/2017 0910 Gross per 24 hour  Intake 200 ml  Output 1300 ml  Net -1100 ml   Filed Weights   01/30/17 0600 01/31/17 0500 02/01/17 0500  Weight: 86.2 kg (190 lb 0.6 oz) 81.2 kg (179  lb 0.2 oz) 80.4 kg (177 lb 4 oz)    Examination: foley in place  General exam: Appears calm and comfortable  Respiratory system: Clear to auscultation. Respiratory effort normal. Cardiovascular system: S1 & S2 heard, RRR. No JVD, murmurs, rubs, gallops or clicks. No pedal edema. Gastrointestinal system: Abdomen is nondistended, soft and nontender. No organomegaly or masses felt. Normal bowel sounds heard. Central nervous system: Alert and oriented. No focal neurological deficits. Extremities: Symmetric 5 x 5 power. Skin: No rashes, lesions or ulcers    Data Reviewed: I have personally reviewed following labs and imaging studies  CBC: Recent Labs  Lab 01/29/17 0608 01/30/17 0323 01/31/17 0726  WBC 8.2 7.6 7.1  HGB 9.7* 9.9* 9.7*  HCT 30.9* 31.2* 30.1*  MCV 90.1 88.1 87.8  PLT 276 280 332   Basic Metabolic Panel: Recent Labs  Lab 01/26/17 1642  01/29/17 0608 01/30/17 0323 01/31/17 0302 02/01/17 0354 02/02/17 0344  NA 159*   < > 146* 140 140 144 142  K 4.5   < > 3.6 3.0* 3.7 3.8 3.5  CL 124*   < > 113* 108 108 110 106  CO2 29   < > 26 25 24 26 24   GLUCOSE 164*   < > 148* 130* 233* 106* 153*  BUN 50*   < > 18 13 13 6 17   CREATININE 2.00*   < > 1.18 1.02 1.07 1.08 1.17  CALCIUM 7.0*   < > 6.5* 6.4* 6.5* 6.8* 6.9*  MG 1.8  --   --   --   --   --   --    < > = values in this interval not displayed.   GFR: Estimated Creatinine Clearance: 73 mL/min (by C-G formula based on SCr of 1.17 mg/dL). Liver Function Tests: Recent Labs  Lab 01/31/17 0726  AST 42*  ALT 28  ALKPHOS 422*  BILITOT 0.5  PROT 6.7  ALBUMIN 1.4*   No results for input(s): LIPASE, AMYLASE in the last 168 hours. No results for input(s): AMMONIA in the last 168 hours. Coagulation Profile: No results for input(s): INR, PROTIME in the last 168 hours. Cardiac Enzymes: No results for input(s): CKTOTAL, CKMB, CKMBINDEX, TROPONINI in the last 168 hours. BNP (last 3 results) No results for input(s):  PROBNP in the last 8760 hours. HbA1C: No results for input(s): HGBA1C in the last 72 hours. CBG: Recent Labs  Lab 02/01/17 1946 02/01/17 2310 02/02/17 0326 02/02/17 0718 02/02/17 1114  GLUCAP 137* 123* 141* 151* 134*   Lipid Profile: No results for input(s): CHOL, HDL, LDLCALC, TRIG, CHOLHDL, LDLDIRECT in the last 72 hours. Thyroid Function Tests: No results for input(s): TSH, T4TOTAL, FREET4, T3FREE, THYROIDAB in the last 72 hours. Anemia Panel: No results for input(s): VITAMINB12, FOLATE, FERRITIN, TIBC, IRON, RETICCTPCT in the last 72 hours. Sepsis Labs: No results for input(s): PROCALCITON, LATICACIDVEN in the last 168 hours.  No results found for this or any previous visit (from the past 240 hour(s)).       Radiology Studies: No results found.      Scheduled Meds: . chlorhexidine gluconate (MEDLINE KIT)  15 mL Mouth Rinse BID  . collagenase   Topical Daily  . enoxaparin (LOVENOX) injection  80  mg Subcutaneous Q12H  . Gerhardt's butt cream   Topical Daily  . insulin aspart  0-20 Units Subcutaneous Q4H  . insulin glargine  15 Units Subcutaneous BID  . mouth rinse  15 mL Mouth Rinse QID   Continuous Infusions: . ampicillin-sulbactam (UNASYN) IV 1.5 g (02/02/17 0910)     LOS: 15 days      Georgette Shell, MD [facility] If 7PM-7AM, please contact night-coverage [email or web address] Password [facility] 02/02/2017, 11:32 AM

## 2017-02-02 NOTE — Progress Notes (Signed)
  Speech Language Pathology Treatment: Dysphagia  Patient Details Name: [name] MRN: [id number] DOB: 01/04/1966 Today's Date: 02/02/2017 Time: 1440-1450 SLP Time Calculation (min) (ACUTE ONLY): 10 min  Assessment / Plan / Recommendation Clinical Impression  SLP discussion with patient regarding MBS results, concern for adequacy of nutritional intake/airway protection with intake given level of dysphagia/gross deconditioning.  Advised pt that he could only consume few SMALL barium boluses prior to ceasing study due to fatigue- feasibility of po tolerance poor.   Also reviewed findings of immediate aspiration/penetration and gross residuals and strategies of throat clearing and re-swallowing causing fatigue and test abbreviation.  Pt reported understanding to information provided.   Provided pt with ice chips and advised for him to conduct multiple strong swallows with each bolus, throat clearing and intermittent cough/hock in attempts to clear secretions.    SLP is concerned with pt's poor awareness to level of dysphagia and SILENT aspiration.  Of note, SLP asked pt if he consumed alcohol - He admitted he had problems with drinking too much alcohol but stopped alcohol intake approximately 20 years ago.      HPI [name] with weakness, decreased appetite and respiratory difficulites.  Pt required intubation from 12/19-12/27.  PMH + for HONK, pancreatitis, sleep apnea,   Swallow evaluation ordered. Pt underwent MBS on Monday Dec 31st and palliative meeting conducted.  Pt desire is to get well and go home.  Repeat MBS indicated to allow instrumental evaluation prior to dietary advancement.  Pt last MBS showed aspiration of all consistencies during MBS and gross weakness without ability to clear penetrates/aspirate despite head turn with chin tuck.  Pt has been po x ice chips        SLP Plan          Recommendations  Diet recommendations: Other(comment)(ice chips) Liquids  provided via: Teaspoon                Oral Care Recommendations: Oral care QID Follow up Recommendations: (tbd) SLP Visit Diagnosis: Dysphagia, pharyngeal phase (R13.13);Dysphagia, pharyngoesophageal phase (R13.14)       GO                [name], [name] 02/02/2017, 4:14 PM  Gregory Burnetamara Lonnette Shrode, MS [facility] SLP [phone]

## 2017-02-02 NOTE — Progress Notes (Signed)
Daily Progress Note   Patient Name: [name]       Date: 02/02/2017 DOB: 09-Jul-1965  Age: 52 y.o. MRN#: [id number] Attending Physician: Gregory Shell, MD Primary Care Physician: Gregory Downing, MD Admit Date: 01/18/2017  Reason for Consultation/Follow-up: Establishing goals of care  Subjective: Pt in bed. Oxygen status improving. RN attempting to wean of HF O2 to regular [place].  Attempted further [place] discussion with patient re: code status and MBS outcomes. He is having repeat MBS at 2pm today. He says he "wants to get better and go home".  We discussed possible outcomes from [facility] and what his decisions may be. He says he does not want to discuss- he prefers to solve problems when they occur. Discussed his overall health status and his problems and decisions. He say that he doesn't feel that any decisions are his alone to make. He would want his spouse to make decisions.  ROS  Length of Stay: 15  Current Medications: Scheduled Meds:  . chlorhexidine gluconate (MEDLINE KIT)  15 mL Mouth Rinse BID  . collagenase   Topical Daily  . [START ON 02/03/2017] enoxaparin (LOVENOX) injection  40 mg Subcutaneous Daily  . Gerhardt's butt cream   Topical Daily  . insulin aspart  0-20 Units Subcutaneous Q4H  . insulin glargine  15 Units Subcutaneous BID  . mouth rinse  15 mL Mouth Rinse QID    Continuous Infusions: . ampicillin-sulbactam (UNASYN) IV Stopped (02/02/17 0940)    PRN Meds: acetaminophen **OR** acetaminophen, hydrALAZINE, iopamidol, labetalol, morphine injection, ondansetron (ZOFRAN) IV  Physical Exam          Vital Signs: BP (!) 104/51   Pulse 93   Temp 98.2 F (36.8 C) (Oral)   Resp 18   Ht 5' 9"  (1.753 m)   Wt 80.4 kg (177 lb 4 oz)   SpO2 100%   BMI 26.18  kg/m  SpO2: SpO2: 100 % O2 Device: O2 Device: High Flow Nasal Cannula O2 Flow Rate: O2 Flow Rate (L/min): 2 L/min  Intake/output summary:   Intake/Output Summary (Last 24 hours) at 02/02/2017 1219 Last data filed at 02/02/2017 0910 Gross per 24 hour  Intake 200 ml  Output 950 ml  Net -750 ml   LBM: Last BM Date: 02/02/17 Baseline Weight: Weight: 99.8 kg (220 lb)  Most recent weight: Weight: 80.4 kg (177 lb 4 oz)       Palliative Assessment/Data: PPS: 10%    Flowsheet Rows     Most Recent Value  Intake Tab  Referral Department  Hospitalist  Unit at Time of Referral  Med/Surg Unit  Palliative Care Primary Diagnosis  Sepsis/Infectious Disease  Date Notified  01/31/17  Palliative Care Type  New Palliative care  Reason for referral  End of Life Care Assistance  Date of Admission  01/18/17  Date first seen by Palliative Care  02/01/17  # of days Palliative referral response time  1 Day(s)  # of days IP prior to Palliative referral  13  Clinical Assessment  Psychosocial & Spiritual Assessment  Palliative Care Outcomes      Patient Active Problem List   Diagnosis Date Noted  . Adjustment disorder with depressed mood   . Palliative care by specialist   . Goals of care, counseling/discussion   . Advance care planning   . Acute on chronic respiratory failure with hypoxia ([place]) 01/30/2017  . Severe sepsis with septic shock ([place]) 01/30/2017  . Type 2 diabetes mellitus with stage 3 chronic kidney disease ([place]) 01/30/2017  . Hypernatremia 01/30/2017  . Hypokalemia 01/30/2017  . Sacral decubitus ulcer, stage III ([place]) 01/30/2017  . ARDS (adult respiratory distress syndrome) ([place]) 01/30/2017  . Acute metabolic encephalopathy 73/66/8159  . AKI (acute kidney injury) ([place]) 01/30/2017  . Pressure injury of skin 01/22/2017  . Malnutrition of moderate degree 01/19/2017  . Pneumonia 01/18/2017  . Atrial flutter ([place]) 09/19/2011  . Obesity 09/19/2011  . Diabetic ketoacidosis with  coma associated with type 2 diabetes mellitus ([place]) 09/19/2011  . Mixed hyperlipidemia 09/19/2011  . Hypertension   . Chronic kidney disease   . Pancreatitis   . Elevated cholesterol with elevated triglycerides   . Sleep apnea   . Vitreous hemorrhage ([place]) 08/03/2011    Palliative Care Assessment & Plan   Patient Profile:51 y.o. male  with past medical history of DM, OSA, HTN, HLD, OSA, atrial fib/flutter admitted on 01/18/2017 with feeling weak and unwell. Workup revealed encephalopathy, DKA, RLL pneumonia, and sepsis requiring intubation and IV pressors.  He was extubated on 12/27. Respiratory status continued to wax and wane- declined bipap, intermittently desatting on NRB. SLP eval noting dysphagia, MBS showing aspiration of all textures. Receiving hydrotherapy for necrotic sacral decubits ulcer. New chest xray on 12/31 showed enlarged RLL opacity likely new aspiration pnuemonia, has not had progression of swallow function. Palliative medicine consulted for [place].  Assessment/Recommendations/Plan   GOC: continue full scope care  PMT will f/u after repeat MBS test  Goals of Care and Additional Recommendations:  Limitations on Scope of Treatment: Full Scope Treatment  Code Status:  Full code  Prognosis:   Unable to determine  Discharge Planning:  To Be Determined  Care plan was discussed with patient and Dr. Zigmund Daniel.  Thank you for allowing the Palliative Medicine Team to assist in the care of this patient.   Time In: 1155 Time Out: 1220 Total Time 25 mins Prolonged Time Billed no      Greater than 50%  of this time was spent counseling and coordinating care related to the above assessment and plan.  [name], AGNP-C Palliative Medicine   Please contact Palliative Medicine Team phone at [phone] for questions and concerns.

## 2017-02-02 NOTE — Progress Notes (Signed)
Nutrition Follow-up  DOCUMENTATION CODES:   Non-severe (moderate) malnutrition in context of chronic illness  INTERVENTION:   Monitor for diet advancement/toleration  Vital 1.5 @ 65 ml/hr Provides: 2340 kcals, 105 grams protein, 1185 ml free water. Meets 100% of protein and calorie needs.   NUTRITION DIAGNOSIS:   Moderate Malnutrition related to chronic illness(DM) as evidenced by moderate muscle depletion, mild muscle depletion, mild fat depletion.  Ongoing  GOAL:   Patient will meet greater than or equal to 90% of their needs  Not meeting  MONITOR:   Diet advancement, Weight trends, Labs, Skin  REASON FOR ASSESSMENT:   Consult Enteral/tube feeding initiation and management  ASSESSMENT:   52 year old man with a history of obesity, diabetes, hypertension, hyperlipidemia, obstructive sleep apnea, atrial fib/flutter with a history of prior cardioversion.  He called his son on the date of admission because he was feeling weak and unwell.  He reportedly had not been eating or drinking for the last several days prior to this.  He was brought by EMS to ED and was noted to be hypoglycemic, encephalopathic, hypotensive.  Insulin and IV fluids were initiated, but he remained hypotensive and altered.  Chest x-ray showed a right lower lobe infiltrate and he was started on antibiotics for a community-acquired versus aspiration pneumonia.  He continued to experience an overall decline and was intubated for airway protection in the ED.   12/26- pt began tube feedings Vital AF 1.2 @ 70 ml/hr 12/27- pt extubated NGT removed, TF discontinued 12/28- pt had bedside swallow test with SLP, high risk for aspiration, remain NPO 1/3- pt had MBS, no improvement in swallowing, remain NPO.    Speech attempted to see pt multiple times throughout this hospital course. Pt has remained NPO without tube feeding for one week. Palliative saw pt 1/2- pt and family desire full scope of care. Recommend  restarting tube feedings as pt is malnourished with multiple wounds. See recommendations above. Needs adjusted according to status. Weight noted to be down 15 lb since last RD visit. Suspect this is fluid loss as pt received lasix.   Medications reviewed and include: SSI, lantus, IV abx Labs reviewed.   Diet Order:  Diet NPO time specified  EDUCATION NEEDS:   No education needs have been identified at this time  Skin:  Skin Assessment: Skin Integrity Issues: Skin Integrity Issues:: Stage II, Unstageable Stage II: R ear Unstageable: full thickness to coccyx  Last BM:  12/26  Height:   Ht Readings from Last 1 Encounters:  01/23/17 5\' 9"  (1.753 m)    Weight:   Wt Readings from Last 1 Encounters:  02/01/17 177 lb 4 oz (80.4 kg)    Ideal Body Weight:  72.73 kg  BMI:  Body mass index is 26.18 kg/m.  Estimated Nutritional Needs:   Kcal:  2150-2350 kcal (27-29 kcal/kg)  Protein:  105-115 grams (1.3-1.4 g/kg)  Fluid:  >2.1 L/day    Vanessa Kickarly Shaunee Mulkern RD, LDN Clinical Nutrition Pager # - [phone]

## 2017-02-03 LAB — GLUCOSE, CAPILLARY
GLUCOSE-CAPILLARY: 95 mg/dL (ref 65–99)
GLUCOSE-CAPILLARY: 207 mg/dL — AB (ref 65–99)
Glucose-Capillary: 102 mg/dL — ABNORMAL HIGH (ref 65–99)
Glucose-Capillary: 117 mg/dL — ABNORMAL HIGH (ref 65–99)
Glucose-Capillary: 163 mg/dL — ABNORMAL HIGH (ref 65–99)
Glucose-Capillary: 121 mg/dL — ABNORMAL HIGH (ref 65–99)

## 2017-02-03 LAB — COMPREHENSIVE METABOLIC PANEL
ALK PHOS: 270 U/L — AB (ref 38–126)
ALT: 19 U/L (ref 17–63)
AST: 21 U/L (ref 15–41)
Albumin: 1.6 g/dL — ABNORMAL LOW (ref 3.5–5.0)
Anion gap: 7 (ref 5–15)
BILIRUBIN TOTAL: 1 mg/dL (ref 0.3–1.2)
BUN: 15 mg/dL (ref 6–20)
CALCIUM: 6.9 mg/dL — AB (ref 8.9–10.3)
CO2: 26 mmol/L (ref 22–32)
CREATININE: 0.97 mg/dL (ref 0.61–1.24)
Chloride: 108 mmol/L (ref 101–111)
Glucose, Bld: 105 mg/dL — ABNORMAL HIGH (ref 65–99)
Potassium: 2.7 mmol/L — CL (ref 3.5–5.1)
SODIUM: 141 mmol/L (ref 135–145)
Total Protein: 7.1 g/dL (ref 6.5–8.1)

## 2017-02-03 LAB — CBC
HCT: 29.5 % — ABNORMAL LOW (ref 39.0–52.0)
HEMOGLOBIN: 9.5 g/dL — AB (ref 13.0–17.0)
MCH: 27.7 pg (ref 26.0–34.0)
MCHC: 32.2 g/dL (ref 30.0–36.0)
MCV: 86 fL (ref 78.0–100.0)
Platelets: 277 10*3/uL (ref 150–400)
RBC: 3.43 MIL/uL — ABNORMAL LOW (ref 4.22–5.81)
RDW: 14 % (ref 11.5–15.5)
WBC: 5.1 10*3/uL (ref 4.0–10.5)

## 2017-02-03 LAB — MAGNESIUM: Magnesium: 1.3 mg/dL — ABNORMAL LOW (ref 1.7–2.4)

## 2017-02-03 MED ORDER — INSULIN GLARGINE 100 UNIT/ML ~~LOC~~ SOLN
10.0000 [IU] | Freq: Two times a day (BID) | SUBCUTANEOUS | Status: DC
  Administered 2017-02-03: 10 [IU] via SUBCUTANEOUS
  Filled 2017-02-03 (×2): qty 0.1

## 2017-02-03 MED ORDER — POTASSIUM CHLORIDE 10 MEQ/100ML IV SOLN
10.0000 meq | INTRAVENOUS | Status: AC
  Administered 2017-02-03 (×5): 10 meq via INTRAVENOUS
  Filled 2017-02-03 (×3): qty 100

## 2017-02-03 NOTE — Progress Notes (Signed)
Inpatient Diabetes Program Recommendations  AACE/ADA: New Consensus Statement on Inpatient Glycemic Control (2015)  Target Ranges:  Prepandial:   less than 140 mg/dL      Peak postprandial:   less than 180 mg/dL (1-2 hours)      Critically ill patients:  140 - 180 mg/dL   Lab Results  Component Value Date   GLUCAP 102 (H) 02/03/2017   HGBA1C >15.5 (H) 01/30/2017    Review of Glycemic Control  Blood sugars running normal to slightly low. Adjust insulin slightly.  Inpatient Diabetes Program Recommendations:    Decrease Lantus to 12 units bid Decrease Novolog to 0-15 units Q4H  Continue to follow trends.  Thank you. Ailene Ardshonda Chrisanne Loose, RD, LDN, CDE Inpatient Diabetes Coordinator [phone]

## 2017-02-03 NOTE — Evaluation (Signed)
Occupational Therapy Evaluation Patient Details Name: [name] MRN: [id number] DOB: November 12, 1965 Today's Date: 02/03/2017    History of Present Illness 52 year old man with a history of obesity, diabetes, hypertension, hyperlipidemia, obstructive sleep apnea, atrial fib/flutter with a history of prior cardioversion.  He called his son on the date of admission because he was feeling weak and unwell, reportedly had not been eating or drinking several days prior to admission;  He was brought by EMS to ED and was noted to be hypoglycemic, encephalopathic, hypotensive.  Pt was intubated in the ED on 12/19, EXT ?12/23; pt with multiple pressure injuries on admission   Clinical Impression   Pt admitted with multiple medical issues. Pt currently with functional limitations due to the deficits listed below (see OT Problem List).  Pt will benefit from skilled OT to increase their safety and independence with ADL and functional mobility for ADL to facilitate discharge to venue listed below.      Follow Up Recommendations  SNF;LTACH    Equipment Recommendations  None recommended by OT    Recommendations for Other Services       Precautions / Restrictions Precautions Precautions: Fall Precaution Comments: flexiseal, multiple lines, coccyx wound Restrictions Weight Bearing Restrictions: No      Mobility Bed Mobility Overal bed mobility: Needs Assistance Bed Mobility: Rolling Rolling: Mod assist Sidelying to sit: Max assist;+2 for physical assistance;+2 for safety/equipment     Sit to sidelying: Mod assist;+2 for physical assistance;+2 for safety/equipment General bed mobility comments: rolls multiple times R/L for pad change and placement; assist with trunk and LEs on and off bed; bed pad utilized for positioning; multi-modal cues needed for pt to self assist  Transfers Overall transfer level: Needs assistance Equipment used: Rolling walker (2 wheeled) Transfers: Sit to/from  Stand Sit to Stand: From elevated surface;Max assist;+2 physical assistance;+2 safety/equipment         General transfer comment: did not perform    Balance Overall balance assessment: Needs assistance Sitting-balance support: Bilateral upper extremity supported;Feet supported Sitting balance-Leahy Scale: Fair       Standing balance-Leahy Scale: Zero                             ADL either performed or assessed with clinical judgement   ADL Overall ADL's : Needs assistance/impaired Eating/Feeding: Minimal assistance;Bed level   Grooming: Minimal assistance;Bed level                                 General ADL Comments: OT sesion focused on BUE strengthening and ROM.  Issued pt yellow theraband and instructed in use.       Vision Patient Visual Report: No change from baseline              Pertinent Vitals/Pain Pain Assessment: No/denies pain     Hand Dominance     Extremity/Trunk Assessment Upper Extremity Assessment Upper Extremity Assessment: Generalized weakness RUE Deficits / Details: decreased grip           Communication Communication Communication: No difficulties   Cognition Arousal/Alertness: Awake/alert Behavior During Therapy: WFL for tasks assessed/performed Overall Cognitive Status: No family/caregiver present to determine baseline cognitive functioning                                 General Comments: poor  motivation    General Comments               Home Living Family/patient expects to be discharged to:: Skilled nursing facility Living Arrangements: Spouse/significant other;Children Available Help at Discharge: Family                                             OT Problem List: Decreased strength;Decreased activity tolerance;Decreased range of motion;Impaired balance (sitting and/or standing);Decreased coordination;Decreased knowledge of use of DME or AE      OT  Treatment/Interventions: Self-care/ADL training;Patient/family education;Therapeutic activities;DME and/or AE instruction;Therapeutic exercise    OT Goals(Current goals can be found in the care plan section) Acute Rehab OT Goals Patient Stated Goal: none stated Time For Goal Achievement: 02/17/17  OT Frequency: Min 2X/week   Barriers to D/C: Decreased caregiver support             AM-PAC PT "6 Clicks" Daily Activity     Outcome Measure Help from another person eating meals?: A Little Help from another person taking care of personal grooming?: A Little Help from another person toileting, which includes using toliet, bedpan, or urinal?: Total Help from another person bathing (including washing, rinsing, drying)?: A Lot Help from another person to put on and taking off regular upper body clothing?: Total Help from another person to put on and taking off regular lower body clothing?: Total 6 Click Score: 11   End of Session Nurse Communication: Mobility status  Activity Tolerance: Patient limited by fatigue Patient left: in bed;with bed alarm set  OT Visit Diagnosis: Unsteadiness on feet (R26.81);Other abnormalities of gait and mobility (R26.89);Muscle weakness (generalized) (M62.81)                Time: [id number] OT Time Calculation (min): 13 min Charges:  OT General Charges $OT Visit: 1 Visit OT Evaluation $OT Eval Moderate Complexity: 1 Mod G-Codes:         Laparis Durrett, [place] KungLorraine D 02/03/2017, 3:15 PM

## 2017-02-03 NOTE — Progress Notes (Signed)
PROGRESS NOTE    [name]  WCB:[id number] DOB: 03-27-1965 DOA: 01/18/2017 PCP: [name], MD   Brief Narrative: 52 y.o.malewith a history of obesity, diabetes, hypertension, hyperlipidemia, obstructive sleep apnea, atrial fib/flutter with a history of prior cardioversion. He was brought in by EMS and was noted to be hyperglycemic, encephalopathic, hypotensive. found to be in DKA and sepsis due to pneumonia.Due to overall decline, patient was intubated for airway protection on 12/19. He was admitted to ICU. [facility] hospitalist assumed care 12/30.  02/02/2017-placed foley back as he was not able to void. 02/03/2017-severe aspiration risk for oropharyngeal dysphagia and pharyngeal esophageal dysphagia.  Discussed reports with the patient today and last night.  Last night he reported that he want to think about it and he will give me an answer today.  He  states that he wants to eat, knowing that his chances of recurrent aspiration and recurrent pneumonia and being on life support may be a vicious cycle.  He stated that he understand the consequences and he will face at as it comes.  However he wants to remain a full code and wants to start eating as soon as possible.  He also reports that he has been this way for many weeks or months.  He reported that he would not be interested in an NG tube or PEG tube at this time.   Assessment & Plan:   Active Problems:   Diabetic ketoacidosis with coma associated with type 2 diabetes mellitus ([name])   Pneumonia   Malnutrition of moderate degree   Pressure injury of skin   Acute on chronic respiratory failure with hypoxia (HCC)   Severe sepsis with septic shock (HCC)   Type 2 diabetes mellitus with stage 3 chronic kidney disease (HCC)   Hypernatremia   Hypokalemia   Sacral decubitus ulcer, stage III (HCC)   ARDS (adult respiratory distress syndrome) (HCC)   Acute metabolic encephalopathy   AKI (acute kidney injury) ([place])   Adjustment  disorder with depressed mood   Palliative care by specialist   Goals of care, counseling/discussion   Advance care planning   Acute respiratory failure with hypoxia due to pneumonia and And ARDS: Intubated on 01/18/2017, then extubated on 01/26/2017, now on BiPAP. Physical therapy evaluated the patient the recommended skilled nursing facility versus LTACchest xray 12/31-.Increasing opacities in right greater than left lung bases, likely atelectasis and/or pneumonia. Probable small right effusion.follow up chest xray today. will dc lasix.patient npo.follow up cxr  Septic shock secondary to community-acquired pneumonia: Now off pressors, sputum cultures with moderate yeast. Blood cultures 1 out of 2 showed contaminant. He haD completed his antibiotic. New aspiration pneumonia: Now on a nonrebreather has improved overnight, will continue IV Unasyn. Swallowing evaluation was done that showed him to be high risk for aspiration. APPRECIATE PALLIATIVE CARE INPUT.  Acute encephalopathy: Continues to improve.  Acute kidney injury: Likely due to prerenal etiology. Now at baseline  Diabetic ketoacidosis with coma associated with type 2 diabetes mellitus ([name]) On admission his blood sugar was 800 with a gap and a pH of 7.1. He was started on IV insulin and IV fluids his blood glucose is now controlled long-acting insulin plus sliding scale.  Blood sugars in the low to low normal range decrease the dose of Lantus.  Dysphagia: Speech evaluated the patient last time on 01/27/2017 and recommended n.p.o. ice chips as needed. Formal swallowing evaluation showed moderate to severe dysphagia, they recommended to keep the patient n.p.o.patient had MBS 02/02/2017.  With severe aspiration.  Severe protein calorie malnutrition with albumin of 1.6  DTI pressure injury to the sacral area followed by PT for hydrotherapy and packing and debridement with Santyl.    DVT prophylaxis Lovenox  Code  Status: Full code Family Communication: No family available Disposition Plan: TBD Consultants: Palliative care  Procedures: Status post intubation Antimicrobials: Unasyn  Subjective: Wants to eat   Objective: Resting in bed in no acute distress Vitals:   02/03/17 0819 02/03/17 0915 02/03/17 1200 02/03/17 1249  BP:  (!) 107/56  123/68  Pulse:  96  94  Resp:  18  20  Temp: 98.4 F (36.9 C)  98.6 F (37 C)   TempSrc:   Oral   SpO2:  94%  95%  Weight:      Height:        Intake/Output Summary (Last 24 hours) at 02/03/2017 1345 Last data filed at 02/03/2017 1244 Gross per 24 hour  Intake 1486.25 ml  Output 1025 ml  Net 461.25 ml   Filed Weights   01/31/17 0500 02/01/17 0500 02/03/17 0347  Weight: 81.2 kg (179 lb 0.2 oz) 80.4 kg (177 lb 4 oz) 79.3 kg (174 lb 13.2 oz)    Examination:  General exam: Appears calm and comfortable  Respiratory system: Clear to auscultation. Respiratory effort normal. Cardiovascular system: S1 & S2 heard, RRR. No JVD, murmurs, rubs, gallops or clicks. No pedal edema. Gastrointestinal system: Abdomen is nondistended, soft and nontender. No organomegaly or masses felt. Normal bowel sounds heard. Central nervous system: Alert and oriented. No focal neurological deficits. Extremities: Symmetric 5 x 5 power. Skin: No rashes, lesions or ulcers Psychiatry: Judgement and insight appear normal. Mood & affect appropriate.     Data Reviewed: I have personally reviewed following labs and imaging studies  CBC: Recent Labs  Lab 01/29/17 0608 01/30/17 0323 01/31/17 0726 02/03/17 0254  WBC 8.2 7.6 7.1 5.1  HGB 9.7* 9.9* 9.7* 9.5*  HCT 30.9* 31.2* 30.1* 29.5*  MCV 90.1 88.1 87.8 86.0  PLT 276 280 249 253   Basic Metabolic Panel: Recent Labs  Lab 01/30/17 0323 01/31/17 0302 02/01/17 0354 02/02/17 0344 02/03/17 0254  NA 140 140 144 142 141  K 3.0* 3.7 3.8 3.5 2.7*  CL 108 108 110 106 108  CO2 _0 GLUCOSE 130* 233* 106* 153* 105*   BUN _1 CREATININE 1.02 1.07 1.08 1.17 0.97  CALCIUM 6.4* 6.5* 6.8* 6.9* 6.9*  MG  --   --   --   --  1.3*   GFR: Estimated Creatinine Clearance: 88.1 mL/min (by C-G formula based on SCr of 0.97 mg/dL). Liver Function Tests: Recent Labs  Lab 01/31/17 0726 02/03/17 0254  AST 42* 21  ALT 28 19  ALKPHOS 422* 270*  BILITOT 0.5 1.0  PROT 6.7 7.1  ALBUMIN 1.4* 1.6*   No results for input(s): LIPASE, AMYLASE in the last 168 hours. No results for input(s): AMMONIA in the last 168 hours. Coagulation Profile: No results for input(s): INR, PROTIME in the last 168 hours. Cardiac Enzymes: No results for input(s): CKTOTAL, CKMB, CKMBINDEX, TROPONINI in the last 168 hours. BNP (last 3 results) No results for input(s): PROBNP in the last 8760 hours. HbA1C: No results for input(s): HGBA1C in the last 72 hours. CBG: Recent Labs  Lab 02/02/17 2055 02/02/17 2305 02/03/17 0319 02/03/17 0734 02/03/17 1145  GLUCAP 118* 109* 95 102* 117*   Lipid Profile: No results for  input(s): CHOL, HDL, LDLCALC, TRIG, CHOLHDL, LDLDIRECT in the last 72 hours. Thyroid Function Tests: No results for input(s): TSH, T4TOTAL, FREET4, T3FREE, THYROIDAB in the last 72 hours. Anemia Panel: No results for input(s): VITAMINB12, FOLATE, FERRITIN, TIBC, IRON, RETICCTPCT in the last 72 hours. Sepsis Labs: No results for input(s): PROCALCITON, LATICACIDVEN in the last 168 hours.  No results found for this or any previous visit (from the past 240 hour(s)).       Radiology Studies: Dg Swallowing Func-speech Pathology  Result Date: 02/02/2017 Objective Swallowing Evaluation: Type of Study: MBS-Modified Barium Swallow Study  Patient Details Name: MATVEY LLANAS MRN: [id number] Date of Birth: 1965/10/09 Today's Date: 02/02/2017 Time: SLP Start Time (ACUTE ONLY): 1405 -SLP Stop Time (ACUTE ONLY): 1435 SLP Time Calculation (min) (ACUTE ONLY): 30 min Past Medical History: Past Medical History: Diagnosis Date .  Atrial fib/flutter, transient 09/19/2011 . Elevated cholesterol with elevated triglycerides  . Hypertension  . Kidney stones  . Pancreatitis 1999 . Pneumonia ~ 2003 . Sleep apnea   nO OFFICALLY DIAGNOSIED, SNORES LOUDLY AND WIFE HAS WITNESSED APNEA WHEN HE IS SLEEPING . Type II diabetes mellitus ([place])  Past Surgical History: Past Surgical History: Procedure Laterality Date . CARDIOVERSION  09/30/2011  Procedure: CARDIOVERSION;  Surgeon: Gregory Hector, MD;  Location: [facility] ENDOSCOPY;  Service: Cardiovascular;  Laterality: N/A; . EYE SURGERY   . PARS PLANA VITRECTOMY  12/08/2011  left . PARS PLANA VITRECTOMY  12/08/2011  Procedure: PARS PLANA VITRECTOMY WITH 25 GAUGE;  Surgeon: Hayden Pedro, MD;  Location: [facility];  Service: Ophthalmology;  Laterality: Left; . TEE WITHOUT CARDIOVERSION  09/30/2011  Procedure: TRANSESOPHAGEAL ECHOCARDIOGRAM (TEE);  Surgeon: Gregory Hector, MD;  Location: [facility] ENDOSCOPY;  Service: Cardiovascular;  Laterality: N/A;  kristine/ebp/beverly ( or scheduling)/ time rescheduled from 1300 to 1200 talked ( mary) [name] [facility] with weakness, decreased appetite and respiratory difficulites.  Pt required intubation from 12/19-12/27.  PMH + for HONK, pancreatitis, sleep apnea,   Swallow evaluation ordered. Pt underwent MBS on Monday Dec 31st and palliative meeting conducted.  Pt desire is to get well and go home.  Repeat MBS indicated to allow instrumental evaluation prior to dietary advancement.  Pt last MBS showed aspiration of all consistencies during MBS and gross weakness without ability to clear penetrates/aspirate despite head turn with chin tuck.  Pt has been po x ice chips   Subjective: pt awake in chair Assessment / Plan / Recommendation CHL IP CLINICAL IMPRESSIONS 02/02/2017 Clinical Impression Limited assessment due to pt stating "I feel like I"m going to give out" and becoming slightly ashen with oxygen desaturation after only a FEW boluses.  SLP assisted to return pt to bed and RTs  transported pt to his room in ICU.  Pt reported feeling better after being returned to his bed and did not appear dyspneic.  Pt continues with moderate oral and severe pharyngo-cervical esophageal dysphagia.  Decreased timing and adequacy of laryngeal closure results in recurrent penetration and eventual aspiration (of penetrates) of all consistencies tested  - (nectar, thin).  Cued throat clearing moved some penetrates into pharynx but pt repenetrated with each swallow.  Aspiration of secretions mixed with barium occured consistently during testing without pt sensation.  Severe pharyngeal residuals continue despite head turn, multiple swallows, and chin tuck posture.    Pt does not clear residuals with dry swallows and was able to conduct dry swallows initially.  Once pt became fatigued after a few boluses ONLY  he stated "That's all I got" and could not or did not dry swallow on command.  Using live video educated pt to findings/concerns. As pt without functional swallow improvement and aspiration of secretions noted - doubtful for functional swallow improvement in his acute stay - advised pt concerns.  SLP Visit Diagnosis Dysphagia, oropharyngeal phase (R13.12);Dysphagia, pharyngoesophageal phase (R13.14) Attention and concentration deficit following -- Frontal lobe and executive function deficit following -- Impact on safety and function Severe aspiration risk;Risk for inadequate nutrition/hydration   CHL IP TREATMENT RECOMMENDATION 02/02/2017 Treatment Recommendations Therapy as outlined in treatment plan below   Prognosis 02/02/2017 Prognosis for Safe Diet Advancement Fair Barriers to Reach Goals Cognitive deficits;Severity of deficits;Motivation Barriers/Prognosis Comment -- CHL IP DIET RECOMMENDATION 02/02/2017 SLP Diet Recommendations NPO;Ice chips PRN after oral care Liquid Administration via -- Medication Administration -- Compensations -- Postural Changes --   CHL IP OTHER RECOMMENDATIONS 01/30/2017  Recommended Consults -- Oral Care Recommendations Oral care QID Other Recommendations --   CHL IP FOLLOW UP RECOMMENDATIONS 01/30/2017 Follow up Recommendations Skilled Nursing facility   [facility] IP FREQUENCY AND DURATION 02/02/2017 Speech Therapy Frequency (ACUTE ONLY) min 2x/week Treatment Duration 2 weeks      CHL IP ORAL PHASE 02/02/2017 Oral Phase Impaired Oral - Pudding Teaspoon -- Oral - Pudding Cup -- Oral - Honey Teaspoon NT Oral - Honey Cup -- Oral - Nectar Teaspoon Reduced posterior propulsion;Premature spillage Oral - Nectar Cup Reduced posterior propulsion;Premature spillage Oral - Nectar Straw Reduced posterior propulsion;Premature spillage Oral - Thin Teaspoon Reduced posterior propulsion;Premature spillage Oral - Thin Cup Premature spillage;Reduced posterior propulsion Oral - Thin Straw -- Oral - Puree NT Oral - Mech Soft -- Oral - Regular -- Oral - Multi-Consistency -- Oral - Pill -- Oral Phase - Comment --  CHL IP PHARYNGEAL PHASE 02/02/2017 Pharyngeal Phase Impaired Pharyngeal- Pudding Teaspoon -- Pharyngeal -- Pharyngeal- Pudding Cup -- Pharyngeal -- Pharyngeal- Honey Teaspoon NT Pharyngeal -- Pharyngeal- Honey Cup -- Pharyngeal -- Pharyngeal- Nectar Teaspoon Reduced pharyngeal peristalsis;Reduced epiglottic inversion;Reduced anterior laryngeal mobility;Reduced laryngeal elevation;Reduced airway/laryngeal closure;Reduced tongue base retraction;Penetration/Aspiration during swallow;Penetration/Apiration after swallow;Pharyngeal residue - pyriform;Pharyngeal residue - valleculae Pharyngeal Material enters airway, passes BELOW cords without attempt by patient to eject out (silent aspiration);Material enters airway, CONTACTS cords and not ejected out Pharyngeal- Nectar Cup -- Pharyngeal -- Pharyngeal- Nectar Straw Compensatory strategies attempted (with notebox);Reduced airway/laryngeal closure;Reduced tongue base retraction;Penetration/Aspiration before swallow;Reduced laryngeal elevation;Reduced anterior  laryngeal mobility;Reduced epiglottic inversion;Reduced pharyngeal peristalsis;Moderate aspiration;Pharyngeal residue - valleculae;Pharyngeal residue - pyriform Pharyngeal Material enters airway, passes BELOW cords without attempt by patient to eject out (silent aspiration);Material enters airway, remains ABOVE vocal cords and not ejected out Pharyngeal- Thin Teaspoon Reduced laryngeal elevation;Reduced anterior laryngeal mobility;Reduced epiglottic inversion;Reduced pharyngeal peristalsis;Reduced airway/laryngeal closure;Penetration/Apiration after swallow;Penetration/Aspiration during swallow;Pharyngeal residue - valleculae;Pharyngeal residue - pyriform;Trace aspiration Pharyngeal Material enters airway, remains ABOVE vocal cords and not ejected out;Material enters airway, passes BELOW cords without attempt by patient to eject out (silent aspiration) Pharyngeal- Thin Cup Reduced pharyngeal peristalsis;Reduced epiglottic inversion;Reduced anterior laryngeal mobility;Reduced laryngeal elevation;Reduced airway/laryngeal closure;Reduced tongue base retraction;Penetration/Aspiration during swallow;Penetration/Apiration after swallow;Moderate aspiration;Pharyngeal residue - pyriform;Pharyngeal residue - valleculae Pharyngeal Material enters airway, remains ABOVE vocal cords and not ejected out;Material enters airway, passes BELOW cords without attempt by patient to eject out (silent aspiration) Pharyngeal- Thin Straw -- Pharyngeal -- Pharyngeal- Puree NT Pharyngeal -- Pharyngeal- Mechanical Soft -- Pharyngeal -- Pharyngeal- Regular -- Pharyngeal -- Pharyngeal- Multi-consistency -- Pharyngeal -- Pharyngeal- Pill -- Pharyngeal -- Pharyngeal Comment --  CHL IP CERVICAL  ESOPHAGEAL PHASE 02/02/2017 Cervical Esophageal Phase Impaired Pudding Teaspoon -- Pudding Cup -- Honey Teaspoon -- Honey Cup -- Nectar Teaspoon -- Nectar Cup -- Nectar Straw -- Thin Teaspoon -- Thin Cup -- Thin Straw -- Puree -- Mechanical Soft -- Regular --  Multi-consistency -- Pill -- Cervical Esophageal Comment decrease UES opening resulting in residuals at pyriform sinus that pt did not clear No flowsheet data found. Luanna Salk, MS [facility] SLP [phone]                   Scheduled Meds: . chlorhexidine gluconate (MEDLINE KIT)  15 mL Mouth Rinse BID  . collagenase   Topical Daily  . enoxaparin (LOVENOX) injection  40 mg Subcutaneous Daily  . Gerhardt's butt cream   Topical Daily  . insulin aspart  0-20 Units Subcutaneous Q4H  . insulin glargine  15 Units Subcutaneous BID  . mouth rinse  15 mL Mouth Rinse QID   Continuous Infusions: . ampicillin-sulbactam (UNASYN) IV 1.5 g (02/03/17 1244)     LOS: 16 days       Georgette Shell, MD [facility]  If 7PM-7AM, please contact night-coverage [email or web address] Password [email or web address] 02/03/2017, 1:45 PM

## 2017-02-03 NOTE — Progress Notes (Signed)
Physical Therapy Treatment Patient Details Name: [name] MRN: [id number] DOB: 07/21/1965 Today's Date: 02/03/2017    History of Present Illness 52 year old man with a history of obesity, diabetes, hypertension, hyperlipidemia, obstructive sleep apnea, atrial fib/flutter with a history of prior cardioversion.  He called his son on the date of admission because he was feeling weak and unwell, reportedly had not been eating or drinking several days prior to admission;  He was brought by EMS to ED and was noted to be hypoglycemic, encephalopathic, hypotensive.  Pt was intubated in the ED on 12/19, EXT ?12/23; pt with multiple pressure injuries on admission    PT Comments    Pt continues to require Mod encouragement for participation. He remains globally weak. He was able to stand briefly at EOB with Max assist +2 and a RW. He fatigues easily with minimal activity. Will continue to follow and progress activity as able/pt will allow. Will need placement.     Follow Up Recommendations  LTACH;SNF     Equipment Recommendations  (continuing to assess)    Recommendations for Other Services       Precautions / Restrictions Precautions Precautions: Fall Precaution Comments: flexiseal, multiple lines, coccyx wound Restrictions Weight Bearing Restrictions: No    Mobility  Bed Mobility Overal bed mobility: Needs Assistance Bed Mobility: Rolling;Sidelying to Sit;Sit to Sidelying Rolling: Mod assist Sidelying to sit: Max assist;+2 for physical assistance;+2 for safety/equipment     Sit to sidelying: Mod assist;+2 for physical assistance;+2 for safety/equipment General bed mobility comments: rolls multiple times R/L for pad change and placement; assist with trunk and LEs on and off bed; bed pad utilized for positioning; multi-modal cues needed for pt to self assist  Transfers Overall transfer level: Needs assistance Equipment used: Rolling walker (2 wheeled) Transfers: Sit to/from  Stand Sit to Stand: From elevated surface;Max assist;+2 physical assistance;+2 safety/equipment         General transfer comment: Assist to rise, stabilize, control descent. Pt stood ~10 seconds before requesting to sit back down. Pt declined a 2nd standing attempt. Assisted back to bed at pt's request.   Ambulation/Gait                 Stairs            Wheelchair Mobility    Modified Rankin (Stroke Patients Only)       Balance Overall balance assessment: Needs assistance Sitting-balance support: Bilateral upper extremity supported;Feet supported Sitting balance-Leahy Scale: Fair       Standing balance-Leahy Scale: Zero                              Cognition Arousal/Alertness: Awake/alert Behavior During Therapy: Flat affect Overall Cognitive Status: No family/caregiver present to determine baseline cognitive functioning                                 General Comments: poor motivation       Exercises      General Comments        Pertinent Vitals/Pain Pain Assessment: No/denies pain    Home Living                      Prior Function            PT Goals (current goals can now be found in the care plan section) Progress towards  PT goals: Progressing toward goals(very slowly)    Frequency    Min 2X/week      PT Plan Current plan remains appropriate    Co-evaluation              AM-PAC PT "6 Clicks" Daily Activity  Outcome Measure  Difficulty turning over in bed (including adjusting bedclothes, sheets and blankets)?: Unable Difficulty moving from lying on back to sitting on the side of the bed? : Unable Difficulty sitting down on and standing up from a chair with arms (e.g., wheelchair, bedside commode, etc,.)?: Unable Help needed moving to and from a bed to chair (including a wheelchair)?: Total Help needed walking in hospital room?: Total Help needed climbing 3-5 steps with a railing? :  Total 6 Click Score: 6    End of Session   Activity Tolerance: Patient limited by fatigue Patient left: in bed;with call bell/phone within reach;with nursing/sitter in room         Time: 1330-1349 PT Time Calculation (min) (ACUTE ONLY): 19 min  Charges:  $Therapeutic Activity: 8-22 mins                    G Codes:         Rebeca AlertJannie Brand Siever, MPT Pager: [phone]

## 2017-02-03 NOTE — Progress Notes (Signed)
Palliative:  Mr. [name] is initially having hydrotherapy and then is sleeping when I come back to revisit. No family at bedside either attempt. Per RN and Dr. Jerolyn CenterMathews notes Mr. [name] currently refuses artificial feeding tube, requests po intake, but also desires aggressive care including reintubation. Per notes wife also desires full aggressive care. These goals do not align very well considering per SLP assessment he has had silent gross aspiration with all consistencies.   Recommend initiation of antidepressant if tolerating po intake. Consider initiating citalopram 20 mg daily. Please call for palliative follow up over the weekend.   No charge  Yong ChannelAlicia Violanda Bobeck, NP Palliative Medicine Team Pager # [phone] (M-F 8a-5p) Team Phone # [phone] (Nights/Weekends)

## 2017-02-03 NOTE — Progress Notes (Addendum)
HYDROTHERAPY TREATMENT     02/03/17 1500  Subjective Assessment  Subjective "okay"  Patient and Family Stated Goals wounds healed  Date of Onset (unknown-POA)  Prior Treatments dressing changes/nursing  Evaluation and Treatment  Evaluation and Treatment Procedures Explained to Patient/Family Yes  Evaluation and Treatment Procedures agreed to  Pressure Injury 01/27/17 Deep Tissue Injury - Purple or maroon localized area of discolored intact skin or blood-filled blister due to damage of underlying soft tissue from pressure and/or shear. PT  Date First Assessed/Time First Assessed: 01/27/17 1600   Location: Coccyx  Staging: Deep Tissue Injury - Purple or maroon localized area of discolored intact skin or blood-filled blister due to damage of underlying soft tissue from pressure and/or she...  Dressing Type Moist to dry;Foam;Gauze (Santyl)  Dressing Changed  Dressing Change Frequency Daily  State of Healing Eschar  Site / Wound Assessment Pink;Yellow;Black  % Wound base Red or Granulating 10%  % Wound base Yellow/Fibrinous Exudate 30%  % Wound base Black/Eschar 60%  Peri-wound Assessment Erythema (non-blanchable);Excoriated (possible moisure associated dermatitis)  Margins Unattached edges (unapproximated)  Drainage Amount Minimal  Drainage Description No odor  Treatment Debridement (Selective);Hydrotherapy (Pulse lavage);Packing (Saline gauze) (enzymatic debridement)  Hydrotherapy  Pulsed lavage therapy - wound location coccyx/sacrum and supra-gluteal fold, r side of gluteal fold and R ischium  Pulsed Lavage with Suction (psi) 8 psi  Pulsed Lavage with Suction - Normal Saline Used 1000 mL  Pulsed Lavage Tip Tip with splash shield  Selective Debridement  Selective Debridement - Tissue Removed none this session. Eschar is adhered.  Wound Therapy - Assess/Plan/Recommendations  Wound Therapy - Clinical Statement 52 yo male admitted after apparently not  moving, eating/drinking for  several days. Developed pressure injury (multiple areas). Admitted with respiratory distress requiring ventilator. The patient will benefit from Hydrotherapy wound treatment to increase healing and improve mobility.  Wound Therapy - Functional Problem List limited mobility d/t global deconditioning  Factors Delaying/Impairing Wound Healing Incontinence;Immobility;Multiple medical problems  Hydrotherapy Plan Debridement;Dressing change;Pulsatile lavage with suction;Patient/family education  Wound Therapy - Frequency 6X / week  Wound Therapy - Current Recommendations PT;OT  Wound Therapy - Follow Up Recommendations Skilled nursing facility  Wound Plan PLS and dressing change  Wound Therapy Goals - Improve the function of patient's integumentary system by progressing the wound(s) through the phases of wound healing by:  Decrease Necrotic Tissue to 50  Decrease Necrotic Tissue - Progress Not progressing-unable to remove any necrotic tissue  Increase Granulation Tissue to 50  Increase Granulation Tissue - Progress Not progressing-unable to remove any necrotic tissue  Patient/Family will be able to  off load sacrum  Patient/Family Instruction Goal - Progress Progressing toward goal  Time For Goal Achievement 2 weeks  Wound Therapy - Potential for Goals Fair    [name], MPT [phone]

## 2017-02-04 DIAGNOSIS — R0603 Acute respiratory distress: Secondary | ICD-10-CM

## 2017-02-04 LAB — GLUCOSE, CAPILLARY
GLUCOSE-CAPILLARY: 42 mg/dL — AB (ref 65–99)
GLUCOSE-CAPILLARY: 159 mg/dL — AB (ref 65–99)
Glucose-Capillary: 140 mg/dL — ABNORMAL HIGH (ref 65–99)
Glucose-Capillary: 173 mg/dL — ABNORMAL HIGH (ref 65–99)
Glucose-Capillary: 202 mg/dL — ABNORMAL HIGH (ref 65–99)
Glucose-Capillary: 74 mg/dL (ref 65–99)
Glucose-Capillary: 91 mg/dL (ref 65–99)

## 2017-02-04 MED ORDER — DEXTROSE 50 % IV SOLN
INTRAVENOUS | Status: AC
  Administered 2017-02-04: 25 mL
  Filled 2017-02-04: qty 50

## 2017-02-04 NOTE — Progress Notes (Addendum)
PROGRESS NOTE    [name]  WNI:[id number] DOB: 19-Feb-1965 DOA: 01/18/2017 PCP: [name], MD   Brief Narrative:52 y.o.malewith a history of obesity, diabetes, hypertension, hyperlipidemia, obstructive sleep apnea, atrial fib/flutter with a history of prior cardioversion. He was brought in by EMS and was noted to be hyperglycemic, encephalopathic, hypotensive. found to be in DKA and sepsis due to pneumonia.Due to overall decline, patient was intubated for airway protection on 12/19. He was admitted to ICU. [facility] hospitalist assumed care 12/30.  02/02/2017-placed foley back as he was not able to void. 02/03/2017-severe aspiration risk for oropharyngeal dysphagia and pharyngeal esophageal dysphagia.  Discussed reports with the patient today and last night.  Last night he reported that he want to think about it and he will give me an answer today.  He  states that he wants to eat, knowing that his chances of recurrent aspiration and recurrent pneumonia and being on life support may be a vicious cycle.  He stated that he understand the consequences and he will face at as it comes.  However he wants to remain a full code and wants to start eating as soon as possible.  He also reports that he has been this way for many weeks or months.  He reported that he would not be interested in an NG tube or PEG tube at this time.  02/04/2017 patient's wife by the bedside reports that patient ate everything her breakfast without any trouble this morning.  Patient denies any complaints chest pain shortness of breath nausea vomiting or diarrhea.   Assessment & Plan:   Active Problems:   Diabetic ketoacidosis with coma associated with type 2 diabetes mellitus ([place])   Pneumonia   Malnutrition of moderate degree   Pressure injury of skin   Acute respiratory failure with hypoxia (HCC)   Severe sepsis with septic shock (HCC)   Type 2 diabetes mellitus with stage 3 chronic kidney disease (HCC)  Hypernatremia   Hypokalemia   Sacral decubitus ulcer, stage III (HCC)   ARDS (adult respiratory distress syndrome) (HCC)   Acute metabolic encephalopathy   AKI (acute kidney injury) ([place])   Adjustment disorder with depressed mood   Palliative care by specialist   Goals of care, counseling/discussion   Advance care planning  Acute respiratory failure with hypoxia due to pneumonia and And ARDS: Intubated on 01/18/2017, then extubated on 01/26/2017, now on BiPAP. Physical therapy evaluated the patient the recommended skilled nursing facility versus LTACchest xray 12/31-.Increasing opacities in right greater than left lung bases, likely atelectasis and/or pneumonia. Probable small right effusion.follow up chest xray today. will dc lasix.patient npo.follow up cxr  Septic shock secondary to community-acquired pneumonia: Now off pressors, sputum cultures with moderate yeast. Blood cultures 1 out of 2 showed contaminant. He haDcompleted his antibiotic. New aspiration pneumonia: Now on a nonrebreather has improved overnight, will continue IV Unasyn. Swallowing evaluation was done that showed him to be high risk for aspiration. APPRECIATE PALLIATIVE CARE INPUT.  Acute encephalopathy: Continues to improve.  Acute kidney injury: Likely due to prerenal etiology. Now at baseline  Diabetic ketoacidosis with coma associated with type 2 diabetes mellitus ([place]) On admission his blood sugar was 800 with a gap and a pH of 7.1. He was started on IV insulin and IV fluids his blood glucose is now controlled long-acting insulin plus sliding scale.   .  I have DC'd the Lantus due to persistently low blood sugar will reassess on Monday.  Dysphagia: Speech evaluated the  patient last time on 01/27/2017 and recommended n.p.o. ice chips as needed. Formal swallowing evaluation showed moderate to severe dysphagia, they recommended to keep the patient n.p.o.patient had MBS 02/02/2017.  With severe  aspiration.  Severe protein calorie malnutrition with albumin of 1.6  DTI pressure injury to the sacral area followed by PT for hydrotherapy and packing and debridement with Santyl.       DVT prophylaxis: Lovenox Code Status: Full code Family Communication: Discussed with wife Disposition Plan: TBD Consultants:  P CCM palliative care Procedures: None Antimicrobials: Unasyn Subjective: No complaints  Objective: Vitals:   02/04/17 0408 02/04/17 0500 02/04/17 0800 02/04/17 1200  BP: (!) 103/44     Pulse: 88  85   Resp: 20  (!) 25   Temp:   98.5 F (36.9 C) 97.9 F (36.6 C)  TempSrc:   Oral Oral  SpO2: 93%  99%   Weight:  76.1 kg (167 lb 12.3 oz)    Height:        Intake/Output Summary (Last 24 hours) at 02/04/2017 1507 Last data filed at 02/04/2017 0500 Gross per 24 hour  Intake 250 ml  Output 1025 ml  Net -775 ml   Filed Weights   02/01/17 0500 02/03/17 0347 02/04/17 0500  Weight: 80.4 kg (177 lb 4 oz) 79.3 kg (174 lb 13.2 oz) 76.1 kg (167 lb 12.3 oz)    Examination:  General exam: Appears calm and comfortable  Respiratory system: Clear to auscultation. Respiratory effort normal. Cardiovascular system: S1 & S2 heard, RRR. No JVD, murmurs, rubs, gallops or clicks. No pedal edema. Gastrointestinal system: Abdomen is nondistended, soft and nontender. No organomegaly or masses felt. Normal bowel sounds heard. Central nervous system: Alert and oriented. No focal neurological deficits. Extremities: Symmetric 5 x 5 power. Skin: No rashes, lesions or ulcers Psychiatry: Judgement and insight appear normal. Mood & affect appropriate.     Data Reviewed: I have personally reviewed following labs and imaging studies  CBC: Recent Labs  Lab 01/29/17 0608 01/30/17 0323 01/31/17 0726 02/03/17 0254  WBC 8.2 7.6 7.1 5.1  HGB 9.7* 9.9* 9.7* 9.5*  HCT 30.9* 31.2* 30.1* 29.5*  MCV 90.1 88.1 87.8 86.0  PLT 276 280 249 829   Basic Metabolic Panel: Recent Labs  Lab  01/30/17 0323 01/31/17 0302 02/01/17 0354 02/02/17 0344 02/03/17 0254  NA 140 140 144 142 141  K 3.0* 3.7 3.8 3.5 2.7*  CL 108 108 110 106 108  CO2 25 24 26 24 26   GLUCOSE 130* 233* 106* 153* 105*  BUN 13 13 6 17 15   CREATININE 1.02 1.07 1.08 1.17 0.97  CALCIUM 6.4* 6.5* 6.8* 6.9* 6.9*  MG  --   --   --   --  1.3*   GFR: Estimated Creatinine Clearance: 88.1 mL/min (by C-G formula based on SCr of 0.97 mg/dL). Liver Function Tests: Recent Labs  Lab 01/31/17 0726 02/03/17 0254  AST 42* 21  ALT 28 19  ALKPHOS 422* 270*  BILITOT 0.5 1.0  PROT 6.7 7.1  ALBUMIN 1.4* 1.6*   No results for input(s): LIPASE, AMYLASE in the last 168 hours. No results for input(s): AMMONIA in the last 168 hours. Coagulation Profile: No results for input(s): INR, PROTIME in the last 168 hours. Cardiac Enzymes: No results for input(s): CKTOTAL, CKMB, CKMBINDEX, TROPONINI in the last 168 hours. BNP (last 3 results) No results for input(s): PROBNP in the last 8760 hours. HbA1C: No results for input(s): HGBA1C in the last 72 hours.  CBG: Recent Labs  Lab 02/04/17 0326 02/04/17 0327 02/04/17 0405 02/04/17 0740 02/04/17 1123  GLUCAP 43* 42* 91 140* 159*   Lipid Profile: No results for input(s): CHOL, HDL, LDLCALC, TRIG, CHOLHDL, LDLDIRECT in the last 72 hours. Thyroid Function Tests: No results for input(s): TSH, T4TOTAL, FREET4, T3FREE, THYROIDAB in the last 72 hours. Anemia Panel: No results for input(s): VITAMINB12, FOLATE, FERRITIN, TIBC, IRON, RETICCTPCT in the last 72 hours. Sepsis Labs: No results for input(s): PROCALCITON, LATICACIDVEN in the last 168 hours.  No results found for this or any previous visit (from the past 240 hour(s)).       Radiology Studies: No results found.      Scheduled Meds: . chlorhexidine gluconate (MEDLINE KIT)  15 mL Mouth Rinse BID  . collagenase   Topical Daily  . enoxaparin (LOVENOX) injection  40 mg Subcutaneous Daily  . Gerhardt's butt  cream   Topical Daily  . insulin aspart  0-20 Units Subcutaneous Q4H  . mouth rinse  15 mL Mouth Rinse QID   Continuous Infusions: . ampicillin-sulbactam (UNASYN) IV Stopped (02/04/17 1347)     LOS: 17 days       Georgette Shell, MD [facility] If 7PM-7AM, please contact night-coverage [email or web address] Password [email or web address] 02/04/2017, 3:07 PM

## 2017-02-04 NOTE — Progress Notes (Signed)
Hypoglycemic Event  CBG:42  Treatment: 25mL of Dextrose and 236 mL of orange juice  Follow-up CBG: Time: 4:05 am CBG Result: 91 mg/dL  Possible Reasons for Event: no appetite  Comments/MD notified: [name]    Gregory BruceAshley M Laterrian Hevener, RN

## 2017-02-04 NOTE — Progress Notes (Signed)
  PT HYDROTHERAPY NOTE   PT's necrotic tissue is still very adherent to wound bed, unable to pull edges or any selective debriding from wounds at this time.    02/04/17 1800  Subjective Assessment  Subjective "okay, I may feel a little better"  Patient and Family Stated Goals wounds healed  Date of Onset (unknown-POA)  Prior Treatments dressing changes/nursing  Evaluation and Treatment  Evaluation and Treatment Procedures Explained to Patient/Family Yes  Evaluation and Treatment Procedures agreed to  Pressure Injury 01/19/17 Stage II -  Partial thickness loss of dermis presenting as a shallow open ulcer with a red, pink wound bed without slough.  Date First Assessed/Time First Assessed: 01/19/17 2133   Location: Ear  Location Orientation: Right  Staging: Stage II -  Partial thickness loss of dermis presenting as a shallow open ulcer with a red, pink wound bed without slough.  Present on Admiss...  Site / Wound Assessment (santyl)  Pressure Injury 01/27/17 Deep Tissue Injury - Purple or maroon localized area of discolored intact skin or blood-filled blister due to damage of underlying soft tissue from pressure and/or shear. PT  Date First Assessed/Time First Assessed: 01/27/17 1600   Location: Coccyx  Staging: Deep Tissue Injury - Purple or maroon localized area of discolored intact skin or blood-filled blister due to damage of underlying soft tissue from pressure and/or she...  Dressing Type Moist to dry;Foam;Gauze (Comment) (Santyl)  Dressing Changed  Dressing Change Frequency Daily  State of Healing Eschar  Site / Wound Assessment Pink;Yellow;Black  % Wound base Red or Granulating 10%  % Wound base Yellow/Fibrinous Exudate 30%  % Wound base Black/Eschar 60%  Peri-wound Assessment Erythema (non-blanchable);Excoriated (possible moisure associated dermatitis)  Margins Unattached edges (unapproximated)  Drainage Amount Minimal  Drainage Description No odor  Treatment Hydrotherapy (Pulse  lavage)  Hydrotherapy  Pulsed lavage therapy - wound location coccyx/sacrum and supra-gluteal fold, r side of gluteal fold and R ischium  Pulsed Lavage with Suction (psi) 12 psi  Pulsed Lavage with Suction - Normal Saline Used 1000 mL  Pulsed Lavage Tip Tip with splash shield  Selective Debridement  Selective Debridement - Location sacral/coccygeal region  Selective Debridement - Tools Used Forceps;Scissors  Selective Debridement - Tissue Removed none this session. Eschar is adhered. (however unable to pull any tissue from adherent wound )  Wound Therapy - Assess/Plan/Recommendations  Wound Therapy - Clinical Statement 52 yo male admitted after apparently not  moving, eating/drinking for several days. Developed pressure injury (multiple areas). Admitted with respiratory distress requiring ventilator. The patient will benefit from Hydrotherapy wound treatment to increase healing and improve mobility.  Wound Therapy - Functional Problem List limited mobility d/t global deconditioning  Factors Delaying/Impairing Wound Healing Incontinence;Immobility;Multiple medical problems  Hydrotherapy Plan Debridement;Dressing change;Pulsatile lavage with suction;Patient/family education  Wound Therapy - Frequency 6X / week  Wound Therapy - Current Recommendations PT;OT  Wound Therapy - Follow Up Recommendations Skilled nursing facility  Wound Plan PLS and dressing change  Wound Therapy Goals - Improve the function of patient's integumentary system by progressing the wound(s) through the phases of wound healing by:  Decrease Necrotic Tissue to 50  Increase Granulation Tissue to 50  Patient/Family will be able to  off load sacrum  Time For Goal Achievement 2 weeks  Wound Therapy - Potential for Goals Roxanne MinsFair  Olvin Rohr, PT Pager: [phone] 02/04/2017

## 2017-02-05 ENCOUNTER — Inpatient Hospital Stay (HOSPITAL_COMMUNITY): Payer: BC Managed Care – PPO

## 2017-02-05 LAB — BASIC METABOLIC PANEL
Anion gap: 7 (ref 5–15)
BUN: 8 mg/dL (ref 6–20)
CHLORIDE: 106 mmol/L (ref 101–111)
CO2: 23 mmol/L (ref 22–32)
Calcium: 6.5 mg/dL — ABNORMAL LOW (ref 8.9–10.3)
Creatinine, Ser: 0.92 mg/dL (ref 0.61–1.24)
GFR calc non Af Amer: >60 mL/min (ref >60)
Glucose, Bld: 207 mg/dL — ABNORMAL HIGH (ref 65–99)
POTASSIUM: 3.4 mmol/L — AB (ref 3.5–5.1)
SODIUM: 136 mmol/L (ref 135–145)

## 2017-02-05 LAB — GLUCOSE, CAPILLARY
GLUCOSE-CAPILLARY: 105 mg/dL — AB (ref 65–99)
GLUCOSE-CAPILLARY: 207 mg/dL — AB (ref 65–99)
GLUCOSE-CAPILLARY: 279 mg/dL — AB (ref 65–99)
GLUCOSE-CAPILLARY: 291 mg/dL — AB (ref 65–99)
Glucose-Capillary: 162 mg/dL — ABNORMAL HIGH (ref 65–99)
Glucose-Capillary: 114 mg/dL — ABNORMAL HIGH (ref 65–99)

## 2017-02-05 MED ORDER — CITALOPRAM HYDROBROMIDE 20 MG PO TABS
20.0000 mg | ORAL_TABLET | Freq: Every day | ORAL | Status: DC
  Administered 2017-02-05 – 2017-02-14 (×10): 20 mg via ORAL
  Filled 2017-02-05 (×9): qty 1

## 2017-02-05 MED ORDER — FUROSEMIDE 10 MG/ML IJ SOLN
20.0000 mg | Freq: Every day | INTRAMUSCULAR | Status: DC
  Administered 2017-02-05 – 2017-02-07 (×3): 20 mg via INTRAVENOUS
  Filled 2017-02-05 (×3): qty 2

## 2017-02-05 MED ORDER — POTASSIUM CHLORIDE CRYS ER 20 MEQ PO TBCR
40.0000 meq | EXTENDED_RELEASE_TABLET | Freq: Four times a day (QID) | ORAL | Status: AC
  Administered 2017-02-05 (×3): 40 meq via ORAL
  Filled 2017-02-05 (×4): qty 2

## 2017-02-05 NOTE — Plan of Care (Signed)
  Respiratory: Ability to maintain a clear airway and adequate ventilation will improve 02/05/2017 2125 - Progressing by Herbert PunAddison, Mahkayla Preece Y, RN

## 2017-02-05 NOTE — Progress Notes (Signed)
PROGRESS NOTE    [name]  HLK:[id number] DOB: 1965-07-25 DOA: 01/18/2017 PCP: [name], MD   Brief Narrative: 52 y.o.malewith a history of obesity, diabetes, hypertension, hyperlipidemia, obstructive sleep apnea, atrial fib/flutter with a history of prior cardioversion. He was brought in by EMS and was noted to be hyperglycemic, encephalopathic, hypotensive. found to be in DKA and sepsis due to pneumonia.Due to overall decline, patient was intubated for airway protection on 12/19. He was admitted to ICU. [facility] assumed care 12/30.  02/02/2017-placed foley backas he was not able to void. 02/03/2017-severe aspiration risk for oropharyngeal dysphagia and pharyngeal esophageal dysphagia.Discussed reports with the patient today and last night. Last night he reported that he want to think about it and he will give me an answer today. He states that he wants to eat,knowing that his chances of recurrent aspiration and recurrent pneumonia and being on life support may be a vicious cycle. He stated that he understand the consequences and he will face at as it comes. However he wants to remain a full code and wants to start eating as soon as possible. He also reports that he has been this way for many weeks or months.He reported that he would not be interested in an NG tube or PEG tube at this time.  02/04/2017 patient's wife by the bedside reports that patient ate everything her breakfast without any trouble this morning.  Patient denies any complaints chest pain shortness of breath nausea vomiting or diarrhea.    Assessment & Plan:   Active Problems:   Diabetic ketoacidosis with coma associated with type 2 diabetes mellitus ([place])   Pneumonia   Malnutrition of moderate degree   Pressure injury of skin   Acute respiratory failure with hypoxia (HCC)   Severe sepsis with septic shock (HCC)   Type 2 diabetes mellitus with stage 3 chronic kidney disease  (HCC)   Hypernatremia   Hypokalemia   Sacral decubitus ulcer, stage III (HCC)   ARDS (adult respiratory distress syndrome) (HCC)   Acute metabolic encephalopathy   AKI (acute kidney injury) ([place])   Adjustment disorder with depressed mood   Palliative care by specialist   Goals of care, counseling/discussion   Advance care planning Acute respiratory failure with hypoxia due to pneumonia and And ARDS: Intubated on 01/18/2017, then extubated on 01/26/2017, now on BiPAP. Physical therapy evaluated the patient the recommended skilled nursing facility versus LTACchest xray 12/31-.Increasing opacities in right greater than left lung bases, likely atelectasis and/or pneumonia. Probable small right effusion.follow up chest xray today.Increasing opacities in right greater than left lung bases, likely atelectasis and/or pneumonia. Probable small right effusion.restart lasix.  Transfer patient to telemetry floor.Follow up chest xary.  Hypokalemia replete potassium and recheck levels. Septic shock secondary to community-acquired pneumonia: Now off pressors, sputum cultures with moderate yeast. Blood cultures 1 out of 2 showed contaminant. He haDcompleted his antibiotic. New aspiration pneumonia: Now on a nonrebreather has improved overnight, will continue IV Unasyn. Swallowing evaluation was done that showed him to be high risk for aspiration. APPRECIATE PALLIATIVE CARE INPUT.  Acute encephalopathy: Continues to improve.  Acute kidney injury: Likely due to prerenal etiology. Now at baseline  Diabetic ketoacidosis with coma associated with type 2 diabetes mellitus ([place]) On admission his blood sugar was 800 with a gap and a pH of 7.1. He was started on IV insulin and IV fluids his blood glucose is now controlled long-acting insulin plus sliding scale. .  I have DC'd the Lantus  due to persistently low blood sugar will reassess on Monday.  Dysphagia: Speech evaluated the patient last  time on 01/27/2017 and recommended n.p.o. ice chips as needed. Formal swallowing evaluation showed moderate to severe dysphagia, they recommended to keep the patient n.p.o.patienthad MBS 02/02/2017.With severe aspiration.  Patient requested to give him food to eat knowing consequences that he could get into recurrent pneumonia respiratory failure and needing intubation.  This was discussed in detail with the patient.  And he wanted to remain full code.  Severe protein calorie malnutrition with albumin of 1.6  DTIpressure injury to the sacral area followed by PT for hydrotherapy and packing and debridement with Santyl.       DVT prophylaxis:lovenox Code Status: full Family Communication:dw wife Disposition Plan: tbd Consultants:  Pccm,palliative care Procedures:none Antimicrobials: unasyn  Subjective:  Objective: Vitals:   02/05/17 0325 02/05/17 0421 02/05/17 0500 02/05/17 0718  BP:  106/61    Pulse:  98    Resp:  (!) 31    Temp: 98.3 F (36.8 C)   97.9 F (36.6 C)  TempSrc: Oral   Oral  SpO2:  95%    Weight:   78.5 kg (173 lb 1 oz)   Height:        Intake/Output Summary (Last 24 hours) at 02/05/2017 0914 Last data filed at 02/05/2017 0647 Gross per 24 hour  Intake -  Output 1500 ml  Net -1500 ml   Filed Weights   02/03/17 0347 02/04/17 0500 02/05/17 0500  Weight: 79.3 kg (174 lb 13.2 oz) 76.1 kg (167 lb 12.3 oz) 78.5 kg (173 lb 1 oz)    Examination:  General exam: Appears calm and comfortable  Respiratory system: Clear to auscultation. Respiratory effort normal. Cardiovascular system: S1 & S2 heard, RRR. No JVD, murmurs, rubs, gallops or clicks. No pedal edema. Gastrointestinal system: Abdomen is nondistended, soft and nontender. No organomegaly or masses felt. Normal bowel sounds heard. Central nervous system: Alert and oriented. No focal neurological deficits. Extremities: Symmetric 5 x 5 power. Skin: No rashes, lesions or ulcers Psychiatry: Judgement and  insight appear normal. Mood & affect appropriate.     Data Reviewed: I have personally reviewed following labs and imaging studies  CBC: Recent Labs  Lab 01/30/17 0323 01/31/17 0726 02/03/17 0254  WBC 7.6 7.1 5.1  HGB 9.9* 9.7* 9.5*  HCT 31.2* 30.1* 29.5*  MCV 88.1 87.8 86.0  PLT 280 249 027   Basic Metabolic Panel: Recent Labs  Lab 01/30/17 0323 01/31/17 0302 02/01/17 0354 02/02/17 0344 02/03/17 0254  NA 140 140 144 142 141  K 3.0* 3.7 3.8 3.5 2.7*  CL 108 108 110 106 108  CO2 _0 GLUCOSE 130* 233* 106* 153* 105*  BUN _1 CREATININE 1.02 1.07 1.08 1.17 0.97  CALCIUM 6.4* 6.5* 6.8* 6.9* 6.9*  MG  --   --   --   --  1.3*   GFR: Estimated Creatinine Clearance: 88.1 mL/min (by C-G formula based on SCr of 0.97 mg/dL). Liver Function Tests: Recent Labs  Lab 01/31/17 0726 02/03/17 0254  AST 42* 21  ALT 28 19  ALKPHOS 422* 270*  BILITOT 0.5 1.0  PROT 6.7 7.1  ALBUMIN 1.4* 1.6*   No results for input(s): LIPASE, AMYLASE in the last 168 hours. No results for input(s): AMMONIA in the last 168 hours. Coagulation Profile: No results for input(s): INR, PROTIME in the last 168 hours. Cardiac Enzymes: No results for input(s):  CKTOTAL, CKMB, CKMBINDEX, TROPONINI in the last 168 hours. BNP (last 3 results) No results for input(s): PROBNP in the last 8760 hours. HbA1C: No results for input(s): HGBA1C in the last 72 hours. CBG: Recent Labs  Lab 02/04/17 1549 02/04/17 1938 02/04/17 2343 02/05/17 0324 02/05/17 0701  GLUCAP 173* 202* 74 162* 114*   Lipid Profile: No results for input(s): CHOL, HDL, LDLCALC, TRIG, CHOLHDL, LDLDIRECT in the last 72 hours. Thyroid Function Tests: No results for input(s): TSH, T4TOTAL, FREET4, T3FREE, THYROIDAB in the last 72 hours. Anemia Panel: No results for input(s): VITAMINB12, FOLATE, FERRITIN, TIBC, IRON, RETICCTPCT in the last 72 hours. Sepsis Labs: No results for input(s): PROCALCITON, LATICACIDVEN in  the last 168 hours.  No results found for this or any previous visit (from the past 240 hour(s)).       Radiology Studies: No results found.      Scheduled Meds: . chlorhexidine gluconate (MEDLINE KIT)  15 mL Mouth Rinse BID  . collagenase   Topical Daily  . enoxaparin (LOVENOX) injection  40 mg Subcutaneous Daily  . Gerhardt's butt cream   Topical Daily  . insulin aspart  0-20 Units Subcutaneous Q4H  . mouth rinse  15 mL Mouth Rinse QID   Continuous Infusions: . ampicillin-sulbactam (UNASYN) IV Stopped (02/05/17 0717)     LOS: 18 days       Georgette Shell, MD [facility]  If 7PM-7AM, please contact night-coverage [email or web address] Password [email or web address] 02/05/2017, 9:14 AM

## 2017-02-06 LAB — GLUCOSE, CAPILLARY
GLUCOSE-CAPILLARY: 43 mg/dL — AB (ref 65–99)
GLUCOSE-CAPILLARY: 111 mg/dL — AB (ref 65–99)
GLUCOSE-CAPILLARY: 216 mg/dL — AB (ref 65–99)
Glucose-Capillary: 209 mg/dL — ABNORMAL HIGH (ref 65–99)
Glucose-Capillary: 198 mg/dL — ABNORMAL HIGH (ref 65–99)
Glucose-Capillary: 277 mg/dL — ABNORMAL HIGH (ref 65–99)

## 2017-02-06 NOTE — Progress Notes (Signed)
   02/06/17 1400  Subjective Assessment  Subjective "okay"  Patient and Family Stated Goals wounds healed  Date of Onset (unknown-POA)  Prior Treatments dressing changes/nursing  Evaluation and Treatment  Evaluation and Treatment Procedures Explained to Patient/Family Yes  Evaluation and Treatment Procedures agreed to  Pressure Injury 01/27/17 Deep Tissue Injury - Purple or maroon localized area of discolored intact skin or blood-filled blister due to damage of underlying soft tissue from pressure and/or shear. PT  Date First Assessed/Time First Assessed: 01/27/17 1600   Location: Coccyx  Staging: Deep Tissue Injury - Purple or maroon localized area of discolored intact skin or blood-filled blister due to damage of underlying soft tissue from pressure and/or she...  Dressing Type Moist to dry;Gauze;Foam (Santyl)  Dressing Changed  Dressing Change Frequency Daily  State of Healing Eschar  Site / Wound Assessment Yellow;Brown;Black;Pink  % Wound base Red or Granulating 10%  % Wound base Yellow/Fibrinous Exudate 30%  % Wound base Black/Eschar 60%  Peri-wound Assessment Erythema (non-blanchable);Excoriated (possible moisture associated dermatitis)  Wound Length (cm) 8.5 cm  Wound Width (cm) 7.5 cm  Wound Depth (cm) 0.3 cm  Wound Surface Area (cm^2) 63.75 cm^2  Wound Volume (cm^3) 19.12 cm^3  Margins Unattached edges (unapproximated)  Drainage Amount Minimal  Drainage Description No odor  Treatment Debridement (Selective);Hydrotherapy (Pulse lavage);Packing (Saline gauze) (Santyl)  Hydrotherapy  Pulsed lavage therapy - wound location coccyx/sacrum and supra-gluteal fold, r side of gluteal fold and R ischium  Pulsed Lavage with Suction (psi) 8 psi  Pulsed Lavage with Suction - Normal Saline Used 1000 mL  Pulsed Lavage Tip Tip with splash shield  Selective Debridement  Selective Debridement - Tissue Removed none this session. Eschar is adhered.  Wound Therapy -  Assess/Plan/Recommendations  Wound Therapy - Clinical Statement 52 yo male admitted after apparently not  moving, eating/drinking for several days. Developed pressure injury (multiple areas). Admitted with respiratory distress requiring ventilator. The patient will benefit from Hydrotherapy wound treatment to increase healing and improve mobility.  Wound Therapy - Functional Problem List limited mobility d/t global deconditioning  Factors Delaying/Impairing Wound Healing Incontinence;Immobility;Multiple medical problems  Hydrotherapy Plan Debridement;Dressing change;Pulsatile lavage with suction;Patient/family education  Wound Therapy - Frequency 6X / week  Wound Therapy - Current Recommendations PT;OT  Wound Therapy - Follow Up Recommendations Skilled nursing facility  Wound Plan Continuing PLS and dressing change. Have been unable to remove any eschar for last few session. Will attempt to have wound care RN come by to take a look. Asked RN to order more Santyl.  Wound Therapy Goals - Improve the function of patient's integumentary system by progressing the wound(s) through the phases of wound healing by:  Decrease Necrotic Tissue to 50  Decrease Necrotic Tissue - Progress Not progressing (unable to remove any eschar)  Increase Granulation Tissue to 50  Increase Granulation Tissue - Progress Mot progressing (unable to remove any eschar)  Patient/Family will be able to  off load sacrum  Patient/Family Instruction Goal - Progress Progressing toward goal  Goals/treatment plan/discharge plan were made with and agreed upon by patient/family Yes  Time For Goal Achievement 2 weeks  Wound Therapy - Potential for Goals Fair   Gregory AlertJannie Sakinah Horn, MPT [phone]

## 2017-02-06 NOTE — Progress Notes (Signed)
  Speech Language Pathology Treatment: Dysphagia  Patient Details Name: [name] MRN: [id number] DOB: 08/06/1965 Today's Date: 02/06/2017 Time: [id number] SLP Time Calculation (min) (ACUTE ONLY): 20 min  Assessment / Plan / Recommendation Clinical Impression  Pt making progress as he has determined to consume po intake with known aspiration risks.  Per chart review, pt also desires to be full code.    Upon SlP entrance to room, pt lying at approximately 25% and overtly coughing - provided him with tissues and set up oral suction for his use.  Pt with tray of broth, jello and tea = approximately 50% consumed.  Pt coughing and oral suctioning brown tinged secretions - and he admitted this was likely broth.  He states "Maybe I did too much" and indicated good tolerance of po over the weekend.  Pt admits he was coughing before letting his bed down - suspect aspiration of portion of breakfast.  He did cease po with overt coughing - SLP advised him to continue to follow body cues. Of note, pt with poor sensation/awareness to residuals on MBS.    SLP educated pt to ongoing concerns for dysphagia/aspiration and mitigation strategies reviewed and provided in writing.   Trial of nectar vs thin completed to elucidate improved airway protection, however pt willing to only accept single bolus of each before repeating "let me down" due to pain from wound.  Per MBS, pt with more residuals with increased viscocity - and thus likely not to tolerate thicker better than thin.   At this time, his CXR negative, he is afebrile and on room air, indicating possible tolerance of aspiration/po.  Pt today able to state and implement strategies with min cues transitioning to mod I by end of session.  Recommend follow up with SLP for RMST and dysphagia treatment.  Advised pt to focus on his swallow strategies today to assure maximal airway protection with po intake.  Using teach back, pt agreeable to plan.    HPI HPI: 52 yo  [facility] weakness, decreased appetite and respiratory difficulites.  Pt required intubation from 12/19-12/27.  PMH + for HONK, pancreatitis, sleep apnea,   Swallow evaluation ordered. Pt underwent MBS on Monday Dec 31st and palliative meeting conducted.  Pt desire is to get well and go home.  Repeat MBS indicated to allow instrumental evaluation prior to dietary advancement.  Pt last MBS showed aspiration of all consistencies during MBS and gross weakness without ability to clear penetrates/aspirate despite head turn with chin tuck.  Pt has been po x ice chips        SLP Plan  Continue with current plan of care       Recommendations  Diet recommendations: Other(comment)(clears) Liquids provided via: Cup Medication Administration: Whole meds with puree Supervision: Patient able to self feed Compensations: Slow rate;Small sips/bites;Multiple dry swallows after each bite/sip;Other (Comment)(cough, "hock" and expectorate) Postural Changes and/or Swallow Maneuvers: Seated upright 90 degrees;Upright 30-60 min after meal(as much as able with coccyx wound)                Oral Care Recommendations: Oral care QID Follow up Recommendations: (tbd) SLP Visit Diagnosis: Dysphagia, pharyngeal phase (R13.13);Dysphagia, pharyngoesophageal phase (R13.14) Plan: Continue with current plan of care       GO                [name], [name] [name] 02/06/2017, 9:09 AM  Gregory Burnetamara Adama Ivins, MS [facility] SLP [phone]

## 2017-02-06 NOTE — Progress Notes (Signed)
Occupational Therapy Treatment Patient Details Name: Gregory MasonKenneth D Horn MRN: [id number] DOB: 04/30/1965 Today's Date: 02/06/2017    History of present illness 52 year old man with a history of obesity, diabetes, hypertension, hyperlipidemia, obstructive sleep apnea, atrial fib/flutter with a history of prior cardioversion.  He called his son on the date of admission because he was feeling weak and unwell, reportedly had not been eating or drinking several days prior to admission;  He was brought by EMS to ED and was noted to be hypoglycemic, encephalopathic, hypotensive.  Pt was intubated in the ED on 12/19, EXT ?12/23; pt with multiple pressure injuries on admission   OT comments  Pt with limited participation this day.  Follow Up Recommendations  SNF;LTACH    Equipment Recommendations  None recommended by OT    Recommendations for Other Services      Precautions / Restrictions Precautions Precautions: Fall Precaution Comments: flexiseal, multiple lines, coccyx wound Restrictions Weight Bearing Restrictions: No       Mobility Bed Mobility              NT    Transfers              NT            ADL either performed or assessed with clinical judgement   ADL   Eating/Feeding: Minimal assistance;Bed level- cup of water only.  Pt wanted OT to hold up, OT encouraged pt to hold cup                                      General ADL Comments: OT sesion focused on BUE strengthening and ROM.  Issued pt yellow theraband and instructed in use.  Pt needed MAX encouragement with not as participation as last OT session               Cognition Arousal/Alertness: Awake/alert Behavior During Therapy: Flat affect Overall Cognitive Status: No family/caregiver present to determine baseline cognitive functioning                                 General Comments: poor motivation         Exercises General Exercises - Upper Extremity Shoulder  Flexion: AROM;Theraband;Strengthening;Supine   Shoulder Instructions       General Comments  pt with limited participation despite encouragement    Pertinent Vitals/ Pain       Pain Score: 4  Pain Location: bottom Pain Descriptors / Indicators: Discomfort Pain Intervention(s): Limited activity within patient's tolerance;Monitored during session         Frequency  Min 2X/week        Progress Toward Goals  OT Goals(current goals can now be found in the care plan section)      not progressing this visit- will reassess next OT visit           AM-PAC PT "6 Clicks" Daily Activity     Outcome Measure   Help from another person eating meals?: A Little Help from another person taking care of personal grooming?: A Little Help from another person toileting, which includes using toliet, bedpan, or urinal?: Total Help from another person bathing (including washing, rinsing, drying)?: A Lot Help from another person to put on and taking off regular upper body clothing?: Total Help from another person to put on and taking off  regular lower body clothing?: Total 6 Click Score: 11    End of Session    OT Visit Diagnosis: Unsteadiness on feet (R26.81);Other abnormalities of gait and mobility (R26.89);Muscle weakness (generalized) (M62.81)   Activity Tolerance Patient limited by fatigue   Patient Left in bed;with bed alarm set   Nurse Communication Mobility status        Time: 4098-1191 OT Time Calculation (min): 16 min  Charges: OT General Charges $OT Visit: 1 Visit OT Treatments $Therapeutic Exercise: 8-22 mins  [place], [place] [phone]   Alba Cory 02/06/2017, 5:09 PM

## 2017-02-06 NOTE — Progress Notes (Signed)
PROGRESS NOTE    [name]  XKP:[id number] DOB: [birth year] DOA: 01/18/2017 PCP: [name], MD  Brief Narrative:52 y.o.malewith a history of obesity, diabetes, hypertension, hyperlipidemia, obstructive sleep apnea, atrial fib/flutter with a history of prior cardioversion. He was brought in by EMS and was noted to be hyperglycemic, encephalopathic, hypotensive. found to be in DKA and sepsis due to pneumonia.Due to overall decline, patient was intubated for airway protection on 12/19. He was admitted to ICU. [facility] hospital[facility] assumed care 12/30.  02/02/2017-placed foley backas he was not able to void. 02/03/2017-severe aspiration risk for oropharyngeal dysphagia and pharyngeal esophageal dysphagia.Discussed reports with the patient today and last night. Last night he reported that he want to think about it and he will give me an answer today. He states that he wants to eat,knowing that his chances of recurrent aspiration and recurrent pneumonia and being on life support may be a vicious cycle. He stated that he understand the consequences and he will face at as it comes. However he wants to remain a full code and wants to start eating as soon as possible. He also reports that he has been this way for many weeks or months.He reported that he would not be interested in an NG tube or PEG tube at this time.  02/04/2017 patient's wife by the bedside reports that patient ate everything her breakfast without any trouble this morning. Patient denies any complaints chest pain shortness of breath nausea vomiting or diarrhea.  02/06/2017-speech notes reviewed.patient denies sob.cough earlier when I saw him.   Assessment & Plan:   Active Problems:   Diabetic ketoacidosis with coma associated with type 2 diabetes mellitus ([place])   Pneumonia   Malnutrition of moderate degree   Pressure injury of skin   Acute respiratory failure with hypoxia (HCC)   Severe sepsis with septic  shock (HCC)   Type 2 diabetes mellitus with stage 3 chronic kidney disease (HCC)   Hypernatremia   Hypokalemia   Sacral decubitus ulcer, stage III (HCC)   ARDS (adult respiratory distress syndrome) (HCC)   Acute metabolic encephalopathy   AKI (acute kidney injury) ([place])   Adjustment disorder with depressed mood   Palliative care by specialist   Goals of care, counseling/discussion   Advance care planning   Acute respiratory failure with hypoxia due to pneumonia and And ARDS: Intubated on 01/18/2017, then extubated on 01/26/2017, now on BiPAP. Physical therapy evaluated the patient the recommended skilled nursing facility versus LTACchest xray 12/31-.Increasing opacities in right greater than left lung bases, likely atelectasis and/or pneumonia. Probable small right effusion.follow up chest xray today.Increasing opacities in right greater than left lung bases, likely atelectasis and/or pneumonia. Probable small right effusion.restart lasix.  Transfer patient to telemetry floor.Follow up chest xary no evidence of infiltrates or effusion 1/6.dc unasyn.he has received and finished his course of multiple antibiotics.  Hypokalemia replete potassium and recheck levels. Septic shock secondary to community-acquired pneumonia: Now off pressors, sputum cultures with moderate yeast. Blood cultures 1 out of 2 showed contaminant. He haDcompleted his antibiotic  Acute kidney injury: Likely due to prerenal etiology. Now at baseline  Diabetic ketoacidosis with coma associated with type 2 diabetes mellitus ([place]) On admission his blood sugar was 800 with a gap and a pH of 7.1. He was started on IV insulin and IV fluids his blood glucose is now controlled long-acting insulin plus sliding scale..I have DC'd the Lantus due to persistently low blood sugar will reassess on Monday.   Dysphagia: Speech  evaluated the patient last time on 01/27/2017 and recommended n.p.o. ice chips as  needed. Formal swallowing evaluation showed moderate to severe dysphagia, they recommended to keep the patient n.p.o.patienthad MBS 02/02/2017.With severe aspiration.  Patient requested to give him food to eat knowing consequences that he could get into recurrent pneumonia respiratory failure and needing intubation.  This was discussed in detail with the patient.  And he wanted to remain full code.  Severe protein calorie malnutrition with albumin of 1.6  DTIpressure injury to the sacral area followed by PT for hydrotherapy and packing and debridement with Santyl.    DVT prophylaxis: lovenox Code Status:full Family Communication: none Disposition Plan:patient can be discharged to a SNF or LTAC when bed available.there is nothing much that can be done in hospital.he needs ongoing therapy and wound care.he refused pallaitive care options. Consultants:   Palliative,pccm Procedures:none Antimicrobials: none Subjective:no complaints had problems earlier today with foley cath which had been replaced.   Objective: Vitals:   02/05/17 1630 02/05/17 2033 02/06/17 0510 02/06/17 1451  BP: 120/63 97/69 106/61 (!) 97/52  Pulse: 88 91 100 (!) 125  Resp: 19 18 18 18   Temp: 97.9 F (36.6 C) 99.2 F (37.3 C) 97.8 F (36.6 C) 98.1 F (36.7 C)  TempSrc: Oral Oral Oral Oral  SpO2: 100% 98% 100% 94%  Weight:   76.7 kg (169 lb 1.5 oz)   Height:        Intake/Output Summary (Last 24 hours) at 02/06/2017 1507 Last data filed at 02/06/2017 1453 Gross per 24 hour  Intake 510 ml  Output 2550 ml  Net -2040 ml   Filed Weights   02/04/17 0500 02/05/17 0500 02/06/17 0510  Weight: 76.1 kg (167 lb 12.3 oz) 78.5 kg (173 lb 1 oz) 76.7 kg (169 lb 1.5 oz)    Examination:  General exam: Appears calm and comfortable  Respiratory system: Clear to auscultation. Respiratory effort normal. Cardiovascular system: S1 & S2 heard, RRR. No JVD, murmurs, rubs, gallops or clicks. No pedal  edema. Gastrointestinal system: Abdomen is nondistended, soft and nontender. No organomegaly or masses felt. Normal bowel sounds heard. Central nervous system: Alert and oriented. No focal neurological deficits. Extremities: Symmetric 5 x 5 power. Skin: No rashes, lesions or ulcers Psychiatry: Judgement and insight appear normal. Mood & affect appropriate.     Data Reviewed: I have personally reviewed following labs and imaging studies  CBC: Recent Labs  Lab 01/31/17 0726 02/03/17 0254  WBC 7.1 5.1  HGB 9.7* 9.5*  HCT 30.1* 29.5*  MCV 87.8 86.0  PLT 249 154   Basic Metabolic Panel: Recent Labs  Lab 01/31/17 0302 02/01/17 0354 02/02/17 0344 02/03/17 0254 02/05/17 0950  NA 140 144 142 141 136  K 3.7 3.8 3.5 2.7* 3.4*  CL 108 110 106 108 106  CO2 24 26 24 26 23   GLUCOSE 233* 106* 153* 105* 207*  BUN 13 6 17 15 8   CREATININE 1.07 1.08 1.17 0.97 0.92  CALCIUM 6.5* 6.8* 6.9* 6.9* 6.5*  MG  --   --   --  1.3*  --    GFR: Estimated Creatinine Clearance: 92.9 mL/min (by C-G formula based on SCr of 0.92 mg/dL). Liver Function Tests: Recent Labs  Lab 01/31/17 0726 02/03/17 0254  AST 42* 21  ALT 28 19  ALKPHOS 422* 270*  BILITOT 0.5 1.0  PROT 6.7 7.1  ALBUMIN 1.4* 1.6*   No results for input(s): LIPASE, AMYLASE in the last 168 hours. No results for  input(s): AMMONIA in the last 168 hours. Coagulation Profile: No results for input(s): INR, PROTIME in the last 168 hours. Cardiac Enzymes: No results for input(s): CKTOTAL, CKMB, CKMBINDEX, TROPONINI in the last 168 hours. BNP (last 3 results) No results for input(s): PROBNP in the last 8760 hours. HbA1C: No results for input(s): HGBA1C in the last 72 hours. CBG: Recent Labs  Lab 02/05/17 2030 02/05/17 2339 02/06/17 0508 02/06/17 0734 02/06/17 1156  GLUCAP 207* 105* 209* 198* 111*   Lipid Profile: No results for input(s): CHOL, HDL, LDLCALC, TRIG, CHOLHDL, LDLDIRECT in the last 72 hours. Thyroid Function  Tests: No results for input(s): TSH, T4TOTAL, FREET4, T3FREE, THYROIDAB in the last 72 hours. Anemia Panel: No results for input(s): VITAMINB12, FOLATE, FERRITIN, TIBC, IRON, RETICCTPCT in the last 72 hours. Sepsis Labs: No results for input(s): PROCALCITON, LATICACIDVEN in the last 168 hours.  No results found for this or any previous visit (from the past 240 hour(s)).       Radiology Studies: Dg Chest 1 View  Result Date: 02/05/2017 CLINICAL DATA:  Cough, atrial flutter EXAM: CHEST 1 VIEW COMPARISON:  01/30/2017 FINDINGS: There is a linear band of airspace disease in the right mid lung likely reflecting atelectasis. There is no focal consolidation. There is no pleural effusion or pneumothorax. The heart and mediastinal contours are unremarkable. The osseous structures are unremarkable. IMPRESSION: No acute cardiopulmonary disease.  Right mid lung atelectasis. Electronically Signed   By: Kathreen Devoid   On: 02/05/2017 12:22        Scheduled Meds: . chlorhexidine gluconate (MEDLINE KIT)  15 mL Mouth Rinse BID  . citalopram  20 mg Oral Daily  . collagenase   Topical Daily  . enoxaparin (LOVENOX) injection  40 mg Subcutaneous Daily  . furosemide  20 mg Intravenous Daily  . Gerhardt's butt cream   Topical Daily  . insulin aspart  0-20 Units Subcutaneous Q4H  . mouth rinse  15 mL Mouth Rinse QID   Continuous Infusions: . ampicillin-sulbactam (UNASYN) IV Stopped (02/06/17 1350)     LOS: 19 days      Georgette Shell, MD [facility] If 7PM-7AM, please contact night-coverage [email or web address] Password [email or web address] 02/06/2017, 3:07 PM

## 2017-02-06 NOTE — Progress Notes (Signed)
   02/06/17 1600  Clinical Encounter Type  Visited With Patient;Health care provider  Visit Type Initial  Stress Factors  Patient Stress Factors Health changes   Rounding on patients on the Palliative list.  He was laying in the bed and seemed to be really uncomfortable and not really wanting to talk, acknowledging he was having a rough day.  He asked for assistance in turning and I alerted the nurse.  Will follow as needed.  Chaplain Agustin CreeNewton Arely Tinner

## 2017-02-06 NOTE — Progress Notes (Signed)
LTAC will not be able to take pt related to pt being self pay. Checked with [facility] and [facility], both said no to LTAC.

## 2017-02-06 NOTE — Progress Notes (Signed)
Patient c/o abdominal discomfort and distention. Foley catheter draining with 400 urine output since change of shift. Bladder scan showed >999 ml. NP on call paged. New order placed. Catheter exchanged with 16 fr. Immediate return of 1300 ml of yellow clear urine. Patient felt relief once catheter started draining. Will continue to monitor closely

## 2017-02-07 LAB — GLUCOSE, CAPILLARY
GLUCOSE-CAPILLARY: 176 mg/dL — AB (ref 65–99)
GLUCOSE-CAPILLARY: 187 mg/dL — AB (ref 65–99)
GLUCOSE-CAPILLARY: 189 mg/dL — AB (ref 65–99)
GLUCOSE-CAPILLARY: 64 mg/dL — AB (ref 65–99)
Glucose-Capillary: 103 mg/dL — ABNORMAL HIGH (ref 65–99)
Glucose-Capillary: 332 mg/dL — ABNORMAL HIGH (ref 65–99)
Glucose-Capillary: 143 mg/dL — ABNORMAL HIGH (ref 65–99)

## 2017-02-07 MED ORDER — BOOST / RESOURCE BREEZE PO LIQD CUSTOM
1.0000 | Freq: Three times a day (TID) | ORAL | Status: DC
  Administered 2017-02-07 – 2017-02-13 (×16): 1 via ORAL

## 2017-02-07 MED ORDER — GERHARDT'S BUTT CREAM
TOPICAL_CREAM | Freq: Three times a day (TID) | CUTANEOUS | Status: DC
  Administered 2017-02-08 – 2017-02-14 (×17): via TOPICAL
  Filled 2017-02-07: qty 1

## 2017-02-07 MED ORDER — FLUCONAZOLE NICU/PED ORAL SYRINGE 10 MG/ML: 100.0000 mg | ORAL | Status: DC

## 2017-02-07 MED ORDER — PRO-STAT SUGAR FREE PO LIQD
30.0000 mL | Freq: Two times a day (BID) | ORAL | Status: DC
  Administered 2017-02-07 – 2017-02-14 (×14): 30 mL via ORAL
  Filled 2017-02-07 (×16): qty 30

## 2017-02-07 MED ORDER — FLUCONAZOLE 40 MG/ML PO SUSR
100.0000 mg | ORAL | Status: DC
  Administered 2017-02-07: 100 mg via ORAL
  Filled 2017-02-07 (×3): qty 2.5

## 2017-02-07 MED ORDER — SODIUM CHLORIDE 0.9 % IV SOLN
Freq: Once | INTRAVENOUS | Status: AC
  Administered 2017-02-07: 19:00:00 via INTRAVENOUS

## 2017-02-07 NOTE — Consult Note (Signed)
Reason for Consult: Sacral and coccyx decubitus. Referring Physician: E Eldor Conaway is an 52 y.o. male.  HPI: We are asked see the patient by wound care.  He has a sacral decubitus that is 60% black eschar 30% fibrinous exudate and 10% red granulating tissue.  He has been receiving hydrotherapy and local wound care for this by the wound care team.  He was admitted on 01/18/17, hypoglycemic, hypotensive chest x-ray showed a right lower lobe infiltrate and encephalopathic.  He was found to have acute hypoxic respiratory failure right lower lobe pneumonia.  Shock and sepsis.  Acute renal failure secondary to hypoperfusion.  He was chronically anticoagulated on Xarelto and switched over to heparin.  He was admitted intubated on 12/19 and extubated on 12/27 to BiPAP and he has been weaned off that now.  Septic shock has resolved.  Metabolic encephalopathy is improved.  DKA has resolved.  He was transferred to the medicine service on 01/29/17.  He was seen by wound care on 01/30/17 and was found to have a pressure injury and was treated with hydrotherapy and calcium alginate.  Along with Santyl and a somatic debriding.  Past Medical History:  Diagnosis Date  . Atrial fib/flutter, transient 09/19/2011  . Elevated cholesterol with elevated triglycerides   . Hypertension   . Kidney stones   . Pancreatitis 1999  . Pneumonia ~ 2003  . Sleep apnea    nO OFFICALLY DIAGNOSIED, SNORES LOUDLY AND WIFE HAS WITNESSED APNEA WHEN HE IS SLEEPING  . Type II diabetes mellitus ([place])     Past Surgical History:  Procedure Laterality Date  . CARDIOVERSION  09/30/2011   Procedure: CARDIOVERSION;  Surgeon: Josue Hector, MD;  Location[facility] ENDOSCOPY;  Service: Cardiovascular;  Laterality: N/A;  . EYE SURGERY    . PARS PLANA VITRECTOMY  12/08/2011   left  . PARS PLANA VITRECTOMY  12/08/2011   Procedure: PARS PLANA VITRECTOMY WITH 25 GAUGE;  Surgeon: Hayden Pedro, MD;  Location: [facility];  Service:  Ophthalmology;  Laterality: Left;  . TEE WITHOUT CARDIOVERSION  09/30/2011   Procedure: TRANSESOPHAGEAL ECHOCARDIOGRAM (TEE);  Surgeon: Josue Hector, MD;  Location: [facility] ENDOSCOPY;  Service: Cardiovascular;  Laterality: N/A;  kristine/ebp/beverly ( or scheduling)/ time rescheduled from 1300 to 1200 talked ( mary)    History reviewed. No pertinent family history.  Social History:  reports that  has never smoked. he has never used smokeless tobacco. He reports that he drinks alcohol. He reports that he does not use drugs.  Allergies: No Known Allergies  Medications:  Prior to Admission:  Medications Prior to Admission  Medication Sig Dispense Refill Last Dose  . Ascorbic Acid (VITAMIN C) 1000 MG tablet Take 500 mg by mouth daily.   01/16/2017  . aspirin EC 81 MG tablet Take 81 mg by mouth daily.   01/16/2017  . fexofenadine (ALLEGRA) 180 MG tablet Take 180 mg by mouth daily.   01/16/2017  . metFORMIN (GLUCOPHAGE) 850 MG tablet Take 850 mg by mouth daily.   01/16/2017  . Multiple Vitamin (MULTIVITAMIN WITH MINERALS) TABS tablet Take 1 tablet by mouth daily.   Past Month at Unknown time  . niacin 500 MG tablet Take 500 mg by mouth 2 (two) times daily with a meal.   01/16/2017  . [DISCONTINUED] bacitracin-polymyxin b (POLYSPORIN) ophthalmic ointment Place 1 application into the left eye 4 (four) times daily. apply to eye every 12 hours while awake (Patient not taking: Reported on 01/18/2017) 3.5  g  Not Taking at Unknown time  . [DISCONTINUED] brimonidine (ALPHAGAN) 0.2 % ophthalmic solution Place 1 drop into the left eye 2 (two) times daily. (Patient not taking: Reported on 01/18/2017) 5 mL  Not Taking at Unknown time  . [DISCONTINUED] gatifloxacin (ZYMAXID) 0.5 % SOLN Place 1 drop into the left eye 4 (four) times daily. (Patient not taking: Reported on 01/18/2017)   Not Taking at Unknown time  . [DISCONTINUED] latanoprost (XALATAN) 0.005 % ophthalmic solution Place 1 drop into the left eye at  bedtime. (Patient not taking: Reported on 01/18/2017) 2.5 mL  Not Taking at Unknown time  . [DISCONTINUED] prednisoLONE acetate (PRED FORTE) 1 % ophthalmic suspension Place 1 drop into the left eye 4 (four) times daily. (Patient not taking: Reported on 01/18/2017) 5 mL  Not Taking at Unknown time  . [DISCONTINUED] Rivaroxaban (XARELTO) 20 MG TABS Take 1 tablet (20 mg total) by mouth daily. (Patient not taking: Reported on 01/18/2017) 30 tablet 12 Not Taking at Unknown time   Scheduled: . chlorhexidine gluconate (MEDLINE KIT)  15 mL Mouth Rinse BID  . citalopram  20 mg Oral Daily  . collagenase   Topical Daily  . enoxaparin (LOVENOX) injection  40 mg Subcutaneous Daily  . feeding supplement  1 Container Oral TID BM  . feeding supplement (PRO-STAT SUGAR FREE 64)  30 mL Oral BID  . furosemide  20 mg Intravenous Daily  . Gerhardt's butt cream   Topical Daily  . insulin aspart  0-20 Units Subcutaneous Q4H  . mouth rinse  15 mL Mouth Rinse QID   Continuous: . ampicillin-sulbactam (UNASYN) IV Stopped (02/07/17 1517)   Anti-infectives (From admission, onward)   Start     Dose/Rate Route Frequency Ordered Stop   01/31/17 0730  ampicillin-sulbactam (UNASYN) 1.5 g in sodium chloride 0.9 % 50 mL IVPB     1.5 g 100 mL/hr over 30 Minutes Intravenous Every 6 hours 01/31/17 0705 02/07/17 2359   01/21/17 1000  vancomycin (VANCOCIN) 1,500 mg in sodium chloride 0.9 % 500 mL IVPB  Status:  Discontinued     1,500 mg 250 mL/hr over 120 Minutes Intravenous Every 48 hours 01/20/17 1001 01/21/17 0904   01/20/17 1200  vancomycin (VANCOCIN) IVPB 1000 mg/200 mL premix  Status:  Discontinued     1,000 mg 200 mL/hr over 60 Minutes Intravenous Every 24 hours 01/19/17 1056 01/20/17 1001   01/19/17 1400  azithromycin (ZITHROMAX) 500 mg in dextrose 5 % 250 mL IVPB  Status:  Discontinued     500 mg 250 mL/hr over 60 Minutes Intravenous Every 24 hours 01/18/17 1733 01/21/17 0909   01/19/17 1130  vancomycin (VANCOCIN)  1,500 mg in sodium chloride 0.9 % 500 mL IVPB     1,500 mg 250 mL/hr over 120 Minutes Intravenous  Once 01/19/17 1056 01/19/17 1330   01/19/17 0000  piperacillin-tazobactam (ZOSYN) IVPB 3.375 g     3.375 g 12.5 mL/hr over 240 Minutes Intravenous Every 8 hours 01/18/17 1746 01/25/17 1350   01/18/17 1600  piperacillin-tazobactam (ZOSYN) IVPB 3.375 g     3.375 g 100 mL/hr over 30 Minutes Intravenous  Once 01/18/17 1547 01/18/17 1812   01/18/17 1315  azithromycin (ZITHROMAX) 500 mg in dextrose 5 % 250 mL IVPB     500 mg 250 mL/hr over 60 Minutes Intravenous  Once 01/18/17 1303 01/18/17 1523   01/18/17 1315  cefTRIAXone (ROCEPHIN) 2 g in dextrose 5 % 50 mL IVPB     2 g 100 mL/hr  over 30 Minutes Intravenous  Once 01/18/17 1303 01/18/17 1405      Results for orders placed or performed during the hospital encounter of 01/18/17 (from the past 48 hour(s))  Glucose, capillary     Status: Abnormal   Collection Time: 02/05/17  4:56 PM  Result Value Ref Range   Glucose-Capillary 279 (H) 65 - 99 mg/dL  Glucose, capillary     Status: Abnormal   Collection Time: 02/05/17  8:30 PM  Result Value Ref Range   Glucose-Capillary 207 (H) 65 - 99 mg/dL  Glucose, capillary     Status: Abnormal   Collection Time: 02/05/17 11:39 PM  Result Value Ref Range   Glucose-Capillary 105 (H) 65 - 99 mg/dL  Glucose, capillary     Status: Abnormal   Collection Time: 02/06/17  5:08 AM  Result Value Ref Range   Glucose-Capillary 209 (H) 65 - 99 mg/dL  Glucose, capillary     Status: Abnormal   Collection Time: 02/06/17  7:34 AM  Result Value Ref Range   Glucose-Capillary 198 (H) 65 - 99 mg/dL  Glucose, capillary     Status: Abnormal   Collection Time: 02/06/17 11:56 AM  Result Value Ref Range   Glucose-Capillary 111 (H) 65 - 99 mg/dL  Glucose, capillary     Status: Abnormal   Collection Time: 02/06/17  4:16 PM  Result Value Ref Range   Glucose-Capillary 216 (H) 65 - 99 mg/dL  Glucose, capillary     Status:  Abnormal   Collection Time: 02/06/17  8:12 PM  Result Value Ref Range   Glucose-Capillary 277 (H) 65 - 99 mg/dL  Glucose, capillary     Status: Abnormal   Collection Time: 02/07/17 12:14 AM  Result Value Ref Range   Glucose-Capillary 176 (H) 65 - 99 mg/dL  Glucose, capillary     Status: Abnormal   Collection Time: 02/07/17  4:47 AM  Result Value Ref Range   Glucose-Capillary 64 (L) 65 - 99 mg/dL  Glucose, capillary     Status: Abnormal   Collection Time: 02/07/17  5:17 AM  Result Value Ref Range   Glucose-Capillary 103 (H) 65 - 99 mg/dL  Glucose, capillary     Status: Abnormal   Collection Time: 02/07/17  7:29 AM  Result Value Ref Range   Glucose-Capillary 187 (H) 65 - 99 mg/dL  Glucose, capillary     Status: Abnormal   Collection Time: 02/07/17 12:05 PM  Result Value Ref Range   Glucose-Capillary 189 (H) 65 - 99 mg/dL    No results found.  Review of Systems  All other systems reviewed and are negative.  Blood pressure (!) 85/47, pulse 96, temperature 98.2 F (36.8 C), temperature source Oral, resp. rate 19, height 5' 9"  (1.753 m), weight 75.5 kg (166 lb 7.2 oz), SpO2 98 %. Physical Exam  Constitutional: He is oriented to person, place, and time. No distress.  Chronically ill-appearing white male in no distress.  HENT:  Head: Normocephalic and atraumatic.  Mouth/Throat: Oropharynx is clear and moist. No oropharyngeal exudate.  Eyes: Right eye exhibits no discharge. Left eye exhibits no discharge. No scleral icterus.  Pupils are equal  Neck: Normal range of motion. Neck supple. No JVD present. No tracheal deviation present. No thyromegaly present.  Cardiovascular: Normal rate, regular rhythm, normal heart sounds and intact distal pulses.  No murmur heard. Respiratory: Effort normal. No respiratory distress. He has no wheezes. He has rales (Few rales at the bases). He exhibits no tenderness.  GI:  Soft. Bowel sounds are normal. He exhibits no distension. There is no  tenderness. There is no rebound and no guarding.  Genitourinary:  Genitourinary Comments: He has a Flexi-Seal in place.  A stage II decubitus over the sacrum.  He has this red erythematous almost yeastlike rash over the buttocks and thighs of both legs.    Musculoskeletal: He exhibits no edema or tenderness.  Lymphadenopathy:    He has no cervical adenopathy.  Neurological: He is alert and oriented to person, place, and time. No cranial nerve deficit.  Skin: Skin is warm and dry. Rash noted. He is not diaphoretic. No erythema. There is pallor.  See picture below  Psychiatric:  Rather flat affect.  When asked why he was so sedentary at home he said he is lazy.      Assessment/Plan: Stage II sacral decubitus. Rash over the buttocks and thighs. Acute respiratory failure with hypoxia secondary to pneumonia and ARDS. -Improved. Septic shock secondary to community-acquired pneumonia -improved Acute kidney injury -resolved Diabetic ketoacidosis -glucose 800 on admission -resolved Dysphasia   Plan: I would continue the hydrotherapy.  I will order an air mattress, I have ordered Gerhard's Butt balm for all the red area.  He has 2 areas  probably full-thickness skin loss in the midportion of this as noted in the picture above.  Unfortunately is surrounded by this very red erythematous skin and I am afraid that the debriding this would just make things worse.  Patient reports he was very sedentary prior to admission, he reported the reason was "I was lazy."  I emphasized the importance of mobility and getting off his buttocks.  I will review with Dr. Marlou Starks and we will follow with you.      Adeleigh Barletta 02/07/2017, 3:51 PM

## 2017-02-07 NOTE — Progress Notes (Signed)
Nutrition Follow-up  DOCUMENTATION CODES:   Non-severe (moderate) malnutrition in context of chronic illness  INTERVENTION:    Boost Breeze po TID, each supplement provides 250 kcal and 9 grams of protein  30 ml Prostat BID, each supplement provides 100 kcals and 15 grams protein.   Monitor for diet advancement/toleration  Monitor for goals of care will provide tube feeding recommendations if desired  NUTRITION DIAGNOSIS:   Moderate Malnutrition related to chronic illness(DM) as evidenced by moderate muscle depletion, mild muscle depletion, mild fat depletion.  Ongoing  GOAL:   Patient will meet greater than or equal to 90% of their needs  Not meeting  MONITOR:   Diet advancement, Weight trends, Labs, Skin  REASON FOR ASSESSMENT:   Consult Enteral/tube feeding initiation and management  ASSESSMENT:   52 year old man with a history of obesity, diabetes, hypertension, hyperlipidemia, obstructive sleep apnea, atrial fib/flutter with a history of prior cardioversion.  He called his son on the date of admission because he was feeling weak and unwell.  He reportedly had not been eating or drinking for the last several days prior to this.  He was brought by EMS to ED and was noted to be hypoglycemic, encephalopathic, hypotensive.  Insulin and IV fluids were initiated, but he remained hypotensive and altered.  Chest x-ray showed a right lower lobe infiltrate and he was started on antibiotics for a community-acquired versus aspiration pneumonia.  He continued to experience an overall decline and was intubated for airway protection in the ED.   12/26- pt began tube feedings Vital AF 1.2 @ 70 ml/hr 12/27- pt extubated NGT removed, TF discontinued 12/28- pt had bedside swallow test with SLP, high risk for aspiration, remain NPO 1/3- pt had MBS, no improvement in swallowing, remain NPO.   1/4- severe aspiration risk, liquids with teaspoon  Spoke with pt who reports he had issues  with aspiration yesterday morning. States he has not ate since. Questioned if pt desires tube feeding as he has not consumed much this entire admission. He replied "I didn't want to initially, but with yesterdays episode I'm changing my mind." Palliative to follow up per last note. Pt amendable to starting clear liquid supplementation. Will provide Boost Breeze and ProStat to trail which one he tolerates better. Reminded pt to sit upright in bed and to swallow multiple times. Weight trending downward 8 lb since last RD visit. Suspect some of this weight loss is actual dry weight loss due to poor intake. Will continue to monitor.   Medications reviewed and include: lasix, SSI, IV abx Labs reviewed: CBG [id number]  Diet Order:  Diet clear liquid Room service appropriate? Yes; Fluid consistency: Thin  EDUCATION NEEDS:   No education needs have been identified at this time  Skin:  Skin Assessment: Skin Integrity Issues: Skin Integrity Issues:: Stage II, Unstageable Stage II: R ear Unstageable: full thickness to coccyx  Last BM:  02/07/17  Height:   Ht Readings from Last 1 Encounters:  01/23/17 5\' 9"  (1.753 m)    Weight:   Wt Readings from Last 1 Encounters:  02/07/17 166 lb 7.2 oz (75.5 kg)    Ideal Body Weight:  72.73 kg  BMI:  Body mass index is 24.58 kg/m.  Estimated Nutritional Needs:   Kcal:  2150-2350 kcal (27-29 kcal/kg)  Protein:  105-115 grams (1.3-1.4 g/kg)  Fluid:  >2.1 L/day    Vanessa Kickarly Nayelis Bonito RD, LDN Clinical Nutrition Pager # - [phone]

## 2017-02-07 NOTE — Progress Notes (Signed)
Spoke with pt, his father at bedside concerning discharge plans. Spoke with pt's wife via [phone] concerning discharge plans. All are aware that LTAC will not take self pay. Pt is aware that he will need to pay out of pocket for SNF if he qualifies. Will continue to follow for discharge needs.

## 2017-02-07 NOTE — Consult Note (Addendum)
WOC Nurse wound follow up Wound type:Pressure Measurement: Coccyx:  8.5cm x 7.5cm with depth unable to be determined due to the presence of eschar. Scant exudate, no odor. Right IT: 4cm x 3.5cm with depth unable to be determined due to the presence of eschar.  Scant exudate, no odor. Wound ZOX:WRUEAVbed:Firmly adherent eschar with no loosening of edges with 2 weeks of hydrotherapy and enzymatic debriding agent. Drainage (amount, consistency, odor) As noted above Periwound: Erythematous with confluent center and satellite lesions consistent with fungal overgrowth. Dressing procedure/placement/frequency:Today I have requested Hospitalist to consider CCS consult for possible surgical debridement as we are making little to no progress with Hydrotherapy and collagenase (enzymatic debriding agent). Additionally, request consideration of systemic antifungal for management of perineal skin infection consistent with yeast overgrowth.  If you agree, please order/arrange. Added mattress replacement with low air loss feature today.  Had in ICU and did not transfer when patient moved to the floor.  WOC nursing team will follow but not routinely, and will remain available to this patient, the nursing and medical teams, seeing in conjunction with hydrotherapy visits.   Thanks, Ladona MowLaurie Kamel Haven, MSN, RN, GNP, Hans EdenCWOCN, CWON-AP, FAAN  Pager# [phone]

## 2017-02-07 NOTE — Progress Notes (Addendum)
PROGRESS NOTE    [name]  WUG:[id number] DOB: [birth year] DOA: 01/18/2017 PCP: [name], MD  Brief Narrative:52 y.o.malewith a history of obesity, diabetes, hypertension, hyperlipidemia, obstructive sleep apnea, atrial fib/flutter with a history of prior cardioversion. He was brought in by EMS and was noted to be hyperglycemic, encephalopathic, hypotensive. found to be in DKA and sepsis due to pneumonia.Due to overall decline, patient was intubated for airway protection on 12/19. He was admitted to ICU. [facility] hospitalist assumed care 12/30.  02/02/2017-placed foley backas he was not able to void. 02/03/2017-severe aspiration risk for oropharyngeal dysphagia and pharyngeal esophageal dysphagia.Discussed reports with the patient today and last night. Last night he reported that he want to think about it and he will give me an answer today. He states that he wants to eat,knowing that his chances of recurrent aspiration and recurrent pneumonia and being on life support may be a vicious cycle. He stated that he understand the consequences and he will face at as it comes. However he wants to remain a full code and wants to start eating as soon as possible. He also reports that he has been this way for many weeks or months.He reported that he would not be interested in an NG tube or PEG tube at this time.  02/04/2017 patient's wife by the bedside reports that patient ate everything her breakfast without any trouble this morning. Patient denies any complaints chest pain shortness of breath nausea vomiting or diarrhea.  02/06/2017-speech notes reviewed.patient denies sob.cough earlier when I saw him.  02/07/2017 patient remains in bed he has a rectal tube and a Foley catheter in place.  He denies shortness of breath or chest pain. Essentially this patient is awaiting for placement.  Assessment & Plan:   Active Problems:   Diabetic ketoacidosis with coma associated with  type 2 diabetes mellitus ([place])   Pneumonia   Malnutrition of moderate degree   Pressure injury of skin   Acute respiratory failure with hypoxia (HCC)   Severe sepsis with septic shock (HCC)   Type 2 diabetes mellitus with stage 3 chronic kidney disease (HCC)   Hypernatremia   Hypokalemia   Sacral decubitus ulcer, stage III (HCC)   ARDS (adult respiratory distress syndrome) (HCC)   Acute metabolic encephalopathy   AKI (acute kidney injury) ([place])   Adjustment disorder with depressed mood   Palliative care by specialist   Goals of care, counseling/discussion   Advance care planning  Acute respiratory failure with hypoxia due to pneumonia and And ARDS: Intubated on 01/18/2017, then extubated on 01/26/2017, now on BiPAP. Physical therapy evaluated the patient the recommended skilled nursing facility versus LTACchest xray 12/31-.Increasing opacities in right greater than left lung bases, likely atelectasis and/or pneumonia. Probable small right effusion.follow up chest xray today.Increasing opacities in right greater than left lung bases, likely atelectasis and/or pneumonia. Probable small right effusion.restart lasix.Transfer patient to telemetry floor.Follow up chest xary no evidence of infiltrates or effusion 1/6.dc unasyn.he has received and finished his course of multiple antibiotics.  Hypokalemia replete potassium and recheck levels. Septic shock secondary to community-acquired pneumonia: Now off pressors, sputum cultures with moderate yeast. Blood cultures 1 out of 2 showed contaminant. He haDcompleted his antibiotic  Acute kidney injury: Likely due to prerenal etiology. Now at baseline  Diabetic ketoacidosis with coma associated with type 2 diabetes mellitus ([place]) On admission his blood sugar was 800 with a gap and a pH of 7.1. He was started on IV insulin and IV fluids  his blood glucose is now controlled long-acting insulin plus sliding scale..I have DC'd the  Lantus due to persistently low blood sugar will reassess on Monday.   Dysphagia: Speech evaluated the patient last time on 01/27/2017 and recommended n.p.o. ice chips as needed. Formal swallowing evaluation showed moderate to severe dysphagia, they recommended to keep the patient n.p.o.patienthad MBS 02/02/2017.With severe aspiration.Patient requested to give him food to eat knowing consequences that he could get into recurrent pneumonia respiratory failure and needing intubation. This was discussed in detail with the patient. And he wanted to remain full code.  Severe protein calorie malnutrition with albumin of 1.6  DTIpressure injury to the sacral area followed by PT for hydrotherapy and packing and debridement with Santyl.  Appreciate surgical evaluation.  Will order Diflucan for 5 days.  For possible fungal rash.      DVT prophylaxis: Lovenox Code Status: Full code Family Communication: None Disposition Plan: Patient waiting for placement to LTAC or SNF.  Patient is very high aspiration risk and was recommended to be n.p.o.  Patient was adamant about eating through his mouth in spite of knowing that he is going to get recurrent aspiration pneumonia.  Patient lives at home with his wife. Consultants: Palliative care, P CCM  Procedures: None Antimicrobials: None   Subjective: No complaints today  Objective: Vitals:   02/06/17 0510 02/06/17 1451 02/06/17 2018 02/07/17 0700  BP: 106/61 (!) 97/52 (!) 90/54 (!) 90/52  Pulse: 100 (!) 125 (!) 101 (!) 105  Resp: 18 18 18 18   Temp: 97.8 F (36.6 C) 98.1 F (36.7 C) 98 F (36.7 C) 98.1 F (36.7 C)  TempSrc: Oral Oral Oral Oral  SpO2: 100% 94% 99% 99%  Weight: 76.7 kg (169 lb 1.5 oz)   75.5 kg (166 lb 7.2 oz)  Height:        Intake/Output Summary (Last 24 hours) at 02/07/2017 1411 Last data filed at 02/07/2017 0704 Gross per 24 hour  Intake 150 ml  Output 1050 ml  Net -900 ml   Filed Weights   02/05/17 0500  02/06/17 0510 02/07/17 0700  Weight: 78.5 kg (173 lb 1 oz) 76.7 kg (169 lb 1.5 oz) 75.5 kg (166 lb 7.2 oz)    Examination:  General exam: Appears calm and comfortable  Respiratory system: Clear to auscultation anteriorly.Marland Kitchen Respiratory effort normal. Cardiovascular system: S1 & S2 heard, RRR. No JVD, murmurs, rubs, gallops or clicks. No pedal edema. Gastrointestinal system: Abdomen is nondistended, soft and nontender. No organomegaly or masses felt. Normal bowel sounds heard. Central nervous system: Alert and oriented. No focal neurological deficits. Extremities: Symmetric 5 x 5 power. Skin: No rashes, lesions or ulcers Psychiatry: Judgement and insight appear normal. Mood & affect appropriate.     Data Reviewed: I have personally reviewed following labs and imaging studies  CBC: Recent Labs  Lab 02/03/17 0254  WBC 5.1  HGB 9.5*  HCT 29.5*  MCV 86.0  PLT 962   Basic Metabolic Panel: Recent Labs  Lab 02/01/17 0354 02/02/17 0344 02/03/17 0254 02/05/17 0950  NA 144 142 141 136  K 3.8 3.5 2.7* 3.4*  CL 110 106 108 106  CO2 26 24 26 23   GLUCOSE 106* 153* 105* 207*  BUN 6 17 15 8   CREATININE 1.08 1.17 0.97 0.92  CALCIUM 6.8* 6.9* 6.9* 6.5*  MG  --   --  1.3*  --    GFR: Estimated Creatinine Clearance: 92.9 mL/min (by C-G formula based on SCr of 0.92 mg/dL).  Liver Function Tests: Recent Labs  Lab 02/03/17 0254  AST 21  ALT 19  ALKPHOS 270*  BILITOT 1.0  PROT 7.1  ALBUMIN 1.6*   No results for input(s): LIPASE, AMYLASE in the last 168 hours. No results for input(s): AMMONIA in the last 168 hours. Coagulation Profile: No results for input(s): INR, PROTIME in the last 168 hours. Cardiac Enzymes: No results for input(s): CKTOTAL, CKMB, CKMBINDEX, TROPONINI in the last 168 hours. BNP (last 3 results) No results for input(s): PROBNP in the last 8760 hours. HbA1C: No results for input(s): HGBA1C in the last 72 hours. CBG: Recent Labs  Lab 02/07/17 0014  02/07/17 0447 02/07/17 0517 02/07/17 0729 02/07/17 1205  GLUCAP 176* 64* 103* 187* 189*   Lipid Profile: No results for input(s): CHOL, HDL, LDLCALC, TRIG, CHOLHDL, LDLDIRECT in the last 72 hours. Thyroid Function Tests: No results for input(s): TSH, T4TOTAL, FREET4, T3FREE, THYROIDAB in the last 72 hours. Anemia Panel: No results for input(s): VITAMINB12, FOLATE, FERRITIN, TIBC, IRON, RETICCTPCT in the last 72 hours. Sepsis Labs: No results for input(s): PROCALCITON, LATICACIDVEN in the last 168 hours.  No results found for this or any previous visit (from the past 240 hour(s)).       Radiology Studies: No results found.      Scheduled Meds: . chlorhexidine gluconate (MEDLINE KIT)  15 mL Mouth Rinse BID  . citalopram  20 mg Oral Daily  . collagenase   Topical Daily  . enoxaparin (LOVENOX) injection  40 mg Subcutaneous Daily  . feeding supplement  1 Container Oral TID BM  . feeding supplement (PRO-STAT SUGAR FREE 64)  30 mL Oral BID  . furosemide  20 mg Intravenous Daily  . Gerhardt's butt cream   Topical Daily  . insulin aspart  0-20 Units Subcutaneous Q4H  . mouth rinse  15 mL Mouth Rinse QID   Continuous Infusions: . ampicillin-sulbactam (UNASYN) IV Stopped (02/07/17 0704)     LOS: 20 days      Georgette Shell, MD [facility]  If 7PM-7AM, please contact night-coverage [email or web address] Password [facility] 02/07/2017, 2:11 PM

## 2017-02-07 NOTE — Progress Notes (Signed)
HYDROTHERAPY TREATMENT     02/07/17 1400  Subjective Assessment  Subjective "okay"  Patient and Family Stated Goals wounds healed  Date of Onset (unknown-POA)  Prior Treatments dressing changes/nursing  Evaluation and Treatment  Evaluation and Treatment Procedures Explained to Patient/Family Yes  Evaluation and Treatment Procedures agreed to  Pressure Injury 01/27/17 Deep Tissue Injury - Purple or maroon localized area of discolored intact skin or blood-filled blister due to damage of underlying soft tissue from pressure and/or shear. PT  Date First Assessed/Time First Assessed: 01/27/17 1600   Location: Coccyx  Staging: Deep Tissue Injury - Purple or maroon localized area of discolored intact skin or blood-filled blister due to damage of underlying soft tissue from pressure and/or she...  Dressing Type Moist to dry;Gauze ;Foam (Santyl)  Dressing Changed  Dressing Change Frequency Daily  State of Healing Eschar  Site / Wound Assessment Yellow;Brown;Black;Pink  % Wound base Red or Granulating 10%  % Wound base Yellow/Fibrinous Exudate 30%  % Wound base Black/Eschar 60%  Peri-wound Assessment Erythema (non-blanchable);Excoriated (possible moisture associated dermatitis)  Margins Unattached edges (unapproximated)  Drainage Amount Scant  Drainage Description No odor  Treatment Debridement (Selective);Hydrotherapy (Pulse lavage);Packing (Saline gauze) (Santyl)  Hydrotherapy  Pulsed lavage therapy - wound location coccyx/sacrum and supra-gluteal fold, r side of gluteal fold and R ischium  Pulsed Lavage with Suction (psi) 8 psi  Pulsed Lavage with Suction - Normal Saline Used 1000 mL  Pulsed Lavage Tip Tip with splash shield  Selective Debridement  Selective Debridement - Tissue Removed none this session. Eschar is adhered.  Wound Therapy - Assess/Plan/Recommendations  Wound Therapy - Clinical Statement 52 yo male admitted after apparently not  moving, eating/drinking for several  days. Developed pressure injury (multiple areas). Admitted with respiratory distress requiring ventilator. The patient will benefit from Hydrotherapy wound treatment to increase healing and improve mobility.  Wound Therapy - Functional Problem List limited mobility d/t global deconditioning  Factors Delaying/Impairing Wound Healing Incontinence;Immobility;Multiple medical problems  Hydrotherapy Plan Debridement;Dressing change;Pulsatile lavage with suction;Patient/family education  Wound Therapy - Frequency 6X / week  Wound Therapy - Current Recommendations PT;OT;Surgery consult  Wound Therapy - Follow Up Recommendations Skilled nursing facility  Wound Plan Continuing PLS and dressing change. Have been unable to remove any eschar for last few sessions. WOC RN assessed wound this visit. Surgical consult may be appropriate.   Wound Therapy Goals - Improve the function of patient's integumentary system by progressing the wound(s) through the phases of wound healing by:  Decrease Necrotic Tissue to 50  Decrease Necrotic Tissue - Progress Not progressing  Increase Granulation Tissue to 50  Increase Granulation Tissue - Progress Not progressing  Patient/Family will be able to  off load sacrum  Patient/Family Instruction Goal - Progress Progressing toward goal  Goals/treatment plan/discharge plan were made with and agreed upon by patient/family Yes  Time For Goal Achievement 2 weeks  Wound Therapy - Potential for Goals Fair   Rebeca AlertJannie Jaysten Essner, MPT [phone]

## 2017-02-07 NOTE — Progress Notes (Signed)
BP 89/51 HR 102.  Dr. Jerolyn CenterMathews notified via text page.

## 2017-02-07 NOTE — Progress Notes (Signed)
Physical Therapy Treatment Patient Details Name: [name] MRN: [id number] DOB: November 04, 1965 Today's Date: 02/07/2017    History of Present Illness 52 year old man with a history of obesity, diabetes, hypertension, hyperlipidemia, obstructive sleep apnea, atrial fib/flutter with a history of prior cardioversion.  He called his son on the date of admission because he was feeling weak and unwell, reportedly had not been eating or drinking several days prior to admission;  He was brought by EMS to ED and was noted to be hypoglycemic, encephalopathic, hypotensive.  Pt was intubated in the ED on 12/19, EXT ?12/23; pt with multiple pressure injuries on admission    PT Comments    Pt was agreeable to participate. He continues to require encouragement to self assist and progress activity. Pt sat EOB briefly before requesting to return to supine/sidelying due to dizziness. Will continue to follow and progress activity as able. Continue to recommend SNF.     Follow Up Recommendations  SNF     Equipment Recommendations  Hospital bed;Wheelchair ;Agricultural consultant with 5" wheels;3in1    Recommendations for Other Services       Precautions / Restrictions Precautions Precautions: Fall Precaution Comments: flexiseal, multiple lines, coccyx wound Restrictions Weight Bearing Restrictions: No    Mobility  Bed Mobility Overal bed mobility: Needs Assistance Bed Mobility: Rolling;Supine to Sit;Sit to Sidelying Rolling: Mod assist   Supine to sit: Mod assist;+2 for physical assistance;+2 for safety/equipment;HOB elevated   Sit to sidelying: Mod assist;+2 for physical assistance;+2 for safety/equipment General bed mobility comments: Roll to L and R x 2. Assist for trunk and LEs. Vcs for technique and for pt to self assist as much as he could  Transfers                 General transfer comment: did not perform-pt c/o dizziness and returned to sidelying.   Ambulation/Gait                 Stairs            Wheelchair Mobility    Modified Rankin (Stroke Patients Only)       Balance Overall balance assessment: Needs assistance Sitting-balance support: Bilateral upper extremity supported;Feet supported Sitting balance-Leahy Scale: Fair Sitting balance - Comments: Pt briefly sat EOB with Min guard assist.                                     Cognition Arousal/Alertness: Awake/alert Behavior During Therapy: Flat affect Overall Cognitive Status: No family/caregiver present to determine baseline cognitive functioning                                 General Comments: poor motivation       Exercises      General Comments        Pertinent Vitals/Pain Pain Assessment: No/denies pain    Home Living                      Prior Function            PT Goals (current goals can now be found in the care plan section) Progress towards PT goals: Progressing toward goals(very slowly. Motivation is an issue)    Frequency    Min 2X/week      PT Plan Current plan remains appropriate    Co-evaluation  AM-PAC PT "6 Clicks" Daily Activity  Outcome Measure  Difficulty turning over in bed (including adjusting bedclothes, sheets and blankets)?: Unable Difficulty moving from lying on back to sitting on the side of the bed? : Unable Difficulty sitting down on and standing up from a chair with arms (e.g., wheelchair, bedside commode, etc,.)?: Unable Help needed moving to and from a bed to chair (including a wheelchair)?: Total Help needed walking in hospital room?: Total Help needed climbing 3-5 steps with a railing? : Total 6 Click Score: 6    End of Session   Activity Tolerance: (limited by dizziness) Patient left: in bed;with call bell/phone within reach;with family/visitor present;with bed alarm set   PT Visit Diagnosis: Muscle weakness (generalized) (M62.81);Adult, failure to thrive  (R62.7);Difficulty in walking, not elsewhere classified (R26.2)     Time: [id number] PT Time Calculation (min) (ACUTE ONLY): 20 min  Charges:  $Therapeutic Activity: 8-22 mins                    G Codes:          Rebeca AlertJannie Kelsee Preslar, MPT Pager: [phone]

## 2017-02-08 LAB — COMPREHENSIVE METABOLIC PANEL
ALK PHOS: 154 U/L — AB (ref 38–126)
ALT: 11 U/L — AB (ref 17–63)
AST: 15 U/L (ref 15–41)
Albumin: 1.6 g/dL — ABNORMAL LOW (ref 3.5–5.0)
Anion gap: 9 (ref 5–15)
BUN: 13 mg/dL (ref 6–20)
CALCIUM: 7.2 mg/dL — AB (ref 8.9–10.3)
CHLORIDE: 106 mmol/L (ref 101–111)
CO2: 22 mmol/L (ref 22–32)
CREATININE: 1 mg/dL (ref 0.61–1.24)
GFR calc Af Amer: >60 mL/min (ref >60)
Glucose, Bld: 191 mg/dL — ABNORMAL HIGH (ref 65–99)
Potassium: 3.6 mmol/L (ref 3.5–5.1)
Sodium: 137 mmol/L (ref 135–145)
Total Bilirubin: 0.4 mg/dL (ref 0.3–1.2)
Total Protein: 7.1 g/dL (ref 6.5–8.1)

## 2017-02-08 LAB — CBC WITH DIFFERENTIAL/PLATELET
Basophils Absolute: 0 10*3/uL (ref 0.0–0.1)
Basophils Relative: 0 %
EOS PCT: 7 %
Eosinophils Absolute: 0.6 10*3/uL (ref 0.0–0.7)
HCT: 27 % — ABNORMAL LOW (ref 39.0–52.0)
Hemoglobin: 8.8 g/dL — ABNORMAL LOW (ref 13.0–17.0)
LYMPHS ABS: 1.4 10*3/uL (ref 0.7–4.0)
Lymphocytes Relative: 15 %
MCH: 27.6 pg (ref 26.0–34.0)
MCHC: 32.6 g/dL (ref 30.0–36.0)
MCV: 84.6 fL (ref 78.0–100.0)
MONOS PCT: 6 %
Monocytes Absolute: 0.5 10*3/uL (ref 0.1–1.0)
Neutro Abs: 6.4 10*3/uL (ref 1.7–7.7)
Neutrophils Relative %: 72 %
PLATELETS: 303 10*3/uL (ref 150–400)
RBC: 3.19 MIL/uL — AB (ref 4.22–5.81)
RDW: 14 % (ref 11.5–15.5)
WBC: 9 10*3/uL (ref 4.0–10.5)

## 2017-02-08 LAB — GLUCOSE, CAPILLARY
GLUCOSE-CAPILLARY: 98 mg/dL (ref 65–99)
Glucose-Capillary: 228 mg/dL — ABNORMAL HIGH (ref 65–99)
Glucose-Capillary: 173 mg/dL — ABNORMAL HIGH (ref 65–99)
Glucose-Capillary: 251 mg/dL — ABNORMAL HIGH (ref 65–99)
Glucose-Capillary: 185 mg/dL — ABNORMAL HIGH (ref 65–99)
Glucose-Capillary: 207 mg/dL — ABNORMAL HIGH (ref 65–99)

## 2017-02-08 LAB — MAGNESIUM: MAGNESIUM: 1.2 mg/dL — AB (ref 1.7–2.4)

## 2017-02-08 MED ORDER — POTASSIUM CHLORIDE 10 MEQ/100ML IV SOLN
10.0000 meq | INTRAVENOUS | Status: AC
  Administered 2017-02-08 (×2): 10 meq via INTRAVENOUS
  Filled 2017-02-08 (×3): qty 100

## 2017-02-08 MED ORDER — MAGNESIUM SULFATE 4 GM/100ML IV SOLN
4.0000 g | Freq: Once | INTRAVENOUS | Status: AC
  Administered 2017-02-08: 4 g via INTRAVENOUS
  Filled 2017-02-08: qty 100

## 2017-02-08 MED ORDER — FLUCONAZOLE 100MG IVPB
100.0000 mg | INTRAVENOUS | Status: AC
  Administered 2017-02-08 – 2017-02-13 (×6): 100 mg via INTRAVENOUS
  Filled 2017-02-08 (×6): qty 50

## 2017-02-08 NOTE — Progress Notes (Signed)
  Speech Language Pathology Treatment: Dysphagia  Patient Details Name: [name] MRN: [id number] DOB: 05/12/1965 Today's Date: 02/08/2017 Time: [id number] SLP Time Calculation (min) (ACUTE ONLY): 20 min  Assessment / Plan / Recommendation Clinical Impression  Pt making progress with improved secretion management today!  Suspect his swallow has improved s/p intubation.  He denies coughing with intake of clears but states he only consumed [employer] Breeze *approximately 1/2 of container remains.  Note pt continues on clear liquid diet only with known risks.  Question if MD desires to advance to full liquids to aid nutrition?   He admits to cough not associated with po but denies expectoration.   SLP initiated respiratory muscle strength training to maximize respiratory/swallow rehabiliation.  Pt conducted 10 repetitions of EMST set at 16 cmH20 pressure an IMST set at 19 cmH20 pressure with good tolerance and effort. He admits to mild fatiguing toward end of session.  Advised him to conduct this exercise 3 times today and 4 times tomorrow using teach back. Reviewed need to monitor for fatigue and increased dyspnea which would indicate need to cease exercise.   Provided pt with handout to document his progress.        HPI [name] with weakness, decreased appetite and respiratory difficulites.  Pt required intubation from 12/19-12/27.  PMH + for HONK, pancreatitis, sleep apnea,   Swallow evaluation ordered. Pt underwent MBS on Monday Dec 31st and palliative meeting conducted.  Pt desire is to get well and go home.  Repeat MBS indicated to allow instrumental evaluation prior to dietary advancement.  Pt last MBS showed aspiration of all consistencies during MBS and gross weakness without ability to clear penetrates/aspirate despite head turn with chin tuck.  Pt has been po x ice chips        SLP Plan  Continue with current plan of care       Recommendations  Diet recommendations:  Other(comment)(pt on clear diet) Liquids provided via: Straw Medication Administration: Whole meds with puree Supervision: Patient able to self feed Compensations: Slow rate;Small sips/bites;Multiple dry swallows after each bite/sip;Other (Comment)(cough, "hock" and expectorate) Postural Changes and/or Swallow Maneuvers: Seated upright 90 degrees;Upright 30-60 min after meal(as much as able with coccyx wound)                Oral Care Recommendations: Oral care QID Follow up Recommendations: (tbd) SLP Visit Diagnosis: Dysphagia, pharyngeal phase (R13.13);Dysphagia, pharyngoesophageal phase (R13.14) Plan: Continue with current plan of care       GO               Gregory Burnetamara Newman Waren, MS [facility] SLP [phone]  [name], Gershom Brobeck [name] 02/08/2017, 11:53 AM

## 2017-02-08 NOTE — Progress Notes (Signed)
PT Hydrotherapy Discontinuation Note  Patient Details Name: Manley MasonKenneth D Loden MRN: [id number] DOB: 06/27/1965   Cancelled Treatment:    Reason Eval/Treat Not Completed: Will discontinue PT hydrotherapy. Very little progression has been made over duration of care. Wound continues to have adherent eschar that is not able to be debrided. There is very little to no drainage from wound. Hydrotherapy is no longer warranted. Will recommend nursing take over wound care/daily dressing changes at this time. Please reorder if wound presentation worsens or eschar becomes nonadherent and able to be debrided. Thank you.    Rebeca AlertJannie Amro Winebarger, MPT Pager: [phone]

## 2017-02-08 NOTE — Progress Notes (Signed)
PROGRESS NOTE    [name]  ION:[id number] DOB: 1965/08/03 DOA: 01/18/2017 PCP: [name], MD    Brief Narrative:  52 y.o.malewith a history of obesity, diabetes, hypertension, hyperlipidemia, obstructive sleep apnea, atrial fib/flutter with a history of prior cardioversion. He was brought in by EMS and was noted to be hyperglycemic, encephalopathic, hypotensive. found to be in DKA and sepsis due to pneumonia.Due to overall decline, patient was intubated for airway protection on 12/19. He was admitted to ICU. [facility] hospitalist assumed care 12/30.  02/02/2017-placed foley backas he was not able to void. 02/03/2017-severe aspiration risk for oropharyngeal dysphagia and pharyngeal esophageal dysphagia.Discussed reports with the patient today and last night. Last night he reported that he want to think about it and he will give me an answer today. He states that he wants to eat,knowing that his chances of recurrent aspiration and recurrent pneumonia and being on life support may be a vicious cycle. He stated that he understand the consequences and he will face at as it comes. However he wants to remain a full code and wants to start eating as soon as possible. He also reports that he has been this way for many weeks or months.He reported that he would not be interested in an NG tube or PEG tube at this time.  02/04/2017 patient's wife by the bedside reports that patient ate everything her breakfast without any trouble this morning. Patient denies any complaints chest pain shortness of breath nausea vomiting or diarrhea.  02/06/2017-speech notes reviewed.patient denies sob.cough earlier when I saw him.  02/07/2017 patient remains in bed he has a rectal tube and a Foley catheter in place.  He denied shortness of breath or chest pain.  Patient seen by wound care as well as PT and undergoing hydrotherapy.  General surgery consulted.    Assessment & Plan:   Active  Problems:   Diabetic ketoacidosis with coma associated with type 2 diabetes mellitus ([place])   Pneumonia   Malnutrition of moderate degree   Pressure injury of skin   Acute respiratory failure with hypoxia (HCC)   Severe sepsis with septic shock (HCC)   Type 2 diabetes mellitus with stage 3 chronic kidney disease (HCC)   Hypernatremia   Hypokalemia   Sacral decubitus ulcer, stage III (HCC)   ARDS (adult respiratory distress syndrome) (HCC)   Acute metabolic encephalopathy   AKI (acute kidney injury) ([place])   Adjustment disorder with depressed mood   Palliative care by specialist   Goals of care, counseling/discussion   Advance care planning  Acute respiratory failure with hypoxia due to pneumonia and And ARDS: Intubated on 01/18/2017, then extubated on 01/26/2017 and transitioned to BiPAP. Physical therapy evaluated the patient the recommended skilled nursing facility versus LTACchest xray 12/31-.Increasing opacities in right greater than left lung bases, likely atelectasis and/or pneumonia. Probable small right effusion.follow up chest xray today.Increasing opacities in right greater than left lung bases, likely atelectasis and/or pneumonia. Probable small right effusion.with clinical improvement.  Patient currently afebrile.  Normal white count.  Patient with a urine output of 1.5 L over the past 24 hours.  Patient is -16.578 L during this hospitalization.  Patient status post IV Lasix.  Status post course of IV antibiotics.  Supportive care.   Hypokalemia  Check a magnesium level.  Try to keep magnesium greater than 2.  Follow.   Septic shock secondary to community-acquired pneumonia: Patient on admission had to be placed on pressors due to septic shock and was  pancultured.  Patient was subsequently placed empirically on IV antibiotics which have been narrowed down.  Now off pressors, sputum cultures with moderate yeast. Blood cultures 1 out of 2 showed contaminant. Patient status  post completion of a course of antibiotics.  No further antibiotics needed at this time.  Acute kidney injury: Likely due to prerenal etiology.  Improved with hydration.  Diabetic ketoacidosis with coma associated with type 2 diabetes mellitus ([place]) On admission his blood sugar was 800 with a gap and a pH of 7.1. He was started on glucose stabilizer, IV fluids with resolution of DKA.  Patient subsequently transitioned to long-acting insulin and sliding scale insulin.  Due to persistently low blood sugar levels long-acting Lantus has been discontinued.  Hemoglobin A1c was greater than 15.5 on 01/30/2017.  CBGs are ranging from 173-251.  Continue sliding scale insulin.    Dysphagia: Speech evaluated the patient last time on 01/27/2017 and recommended n.p.o. ice chips as needed. Formal swallowing evaluation showed moderate to severe dysphagia, they recommended to keep the patient n.p.o.patienthad MBS 02/02/2017.With severe aspiration.Patient requested to give him food to eat knowing consequences that he could get into recurrent pneumonia respiratory failure and needing intubation. This was discussed in detail with the patient. And he wanted to remain full code. Patient has been reassessed by speech therapy who feel patient is making progress with improved secretion management.  Respiratory muscle strength training to maximize respiratory/swallow rehabilitation was demonstrated to the patient by speech therapy.  Diet to be advanced to full liquid diet per speech therapy recommendations.  Severe protein calorie malnutrition with albumin of 1.6 Patient with significant aspiration concerns due to dysphagia noted by speech therapy.  Follow.  DTIpressure injury to the sacral area/stage II/III sacral decubitus  followed by PT for hydrotherapy and packing and debridement with Santyl. Patient has been seen in consultation by general surgery who recommend to continue ongoing hydrotherapy.   Gerhardt's butt cream has been ordered.  Also noted that there were concerns for possible fungal rash and as such patient was started on Diflucan.  We will change Diflucan to IV due to swallow difficulties.  Patient will likely need to follow-up on the wound care center on discharge.  Appreciate general surgical input and recommendations.   Probable depression Patient has been started on Celexa.  Follow.  Will need outpatient follow-up.      DVT prophylaxis: Lovenox Code Status: Full Family Communication: Updated patient.  No family at bedside. Disposition Plan: Awaiting skilled nursing facility versus LTAC bed.   Consultants:   General surgery: Dr.Toth III 02/07/2017  Wound care 01/25/2017  Procedures:   CT head without contrast 01/23/2017  CT abdomen and pelvis 01/19/2017  Chest x-ray 02/05/2017, 01/30/2017  Antimicrobials:   IV Unasyn 01/31/2017>>>> 02/07/2017 Zithromax 12/19 >> 12/22 Rocephin 12/19 >> 12/19 Zosyn 12/19 >>  Vancomycin 12/20 >> 12/22  Lines/tubes: ETT 12/19 >> 12/27 Femoral CVL 12/19 >> 12/24   Events: 12/19 Admit 12/20 ARDS protocol, start nimbex 12/22 Off nimbex 12/23 Off pressors, sedation  Urine 12/19 >> negative Blood 12/19 >> coag neg Staph Sputum 12/21 >> Candida Glabrata   Subjective: Patient denies any chest pain or shortness of breath.  Patient states he is feeling better and was able to tolerate his oral diet.  Patient still insistent on trying to eat.  Objective: Vitals:   02/07/17 1837 02/07/17 2029 02/08/17 0007 02/08/17 0442  BP: (!) 89/51 106/65 (!) 96/59 110/65  Pulse: (!) 102 97 91 (!) 101  Resp:  19  19  Temp:  98.7 F (37.1 C)  98.3 F (36.8 C)  TempSrc:  Oral  Oral  SpO2:  98%  98%  Weight:      Height:        Intake/Output Summary (Last 24 hours) at 02/08/2017 1118 Last data filed at 02/08/2017 0443 Gross per 24 hour  Intake 1170 ml  Output 1500 ml  Net -330 ml   Filed Weights   02/05/17 0500 02/06/17 0510  02/07/17 0700  Weight: 78.5 kg (173 lb 1 oz) 76.7 kg (169 lb 1.5 oz) 75.5 kg (166 lb 7.2 oz)    Examination:  General exam: Appears calm and comfortable  Respiratory system: Clear to auscultation. Respiratory effort normal. Cardiovascular system: S1 & S2 heard, RRR. No JVD, murmurs, rubs, gallops or clicks. No pedal edema. Gastrointestinal system: Abdomen is nondistended, soft and nontender. No organomegaly or masses felt. Normal bowel sounds heard. Central nervous system: Alert and oriented. No focal neurological deficits. Extremities: Symmetric 5 x 5 power. Skin: Stage II decubitus ulcer sacrum with red erythematous almost yeastlike rash over buttocks and thighs of both legs.  Genitourinary: Rectal tube in place. Psychiatry: Judgement and insight appear normal. Mood & affect flat.     Data Reviewed: I have personally reviewed following labs and imaging studies  CBC: Recent Labs  Lab 02/03/17 0254 02/08/17 0842  WBC 5.1 9.0  NEUTROABS  --  6.4  HGB 9.5* 8.8*  HCT 29.5* 27.0*  MCV 86.0 84.6  PLT 277 884   Basic Metabolic Panel: Recent Labs  Lab 02/02/17 0344 02/03/17 0254 02/05/17 0950 02/08/17 0842  NA 142 141 136 137  K 3.5 2.7* 3.4* 3.6  CL 106 108 106 106  CO2 24 26 23 22   GLUCOSE 153* 105* 207* 191*  BUN 17 15 8 13   CREATININE 1.17 0.97 0.92 1.00  CALCIUM 6.9* 6.9* 6.5* 7.2*  MG  --  1.3*  --  1.2*   GFR: Estimated Creatinine Clearance: 85.4 mL/min (by C-G formula based on SCr of 1 mg/dL). Liver Function Tests: Recent Labs  Lab 02/03/17 0254 02/08/17 0842  AST 21 15  ALT 19 11*  ALKPHOS 270* 154*  BILITOT 1.0 0.4  PROT 7.1 7.1  ALBUMIN 1.6* 1.6*   No results for input(s): LIPASE, AMYLASE in the last 168 hours. No results for input(s): AMMONIA in the last 168 hours. Coagulation Profile: No results for input(s): INR, PROTIME in the last 168 hours. Cardiac Enzymes: No results for input(s): CKTOTAL, CKMB, CKMBINDEX, TROPONINI in the last 168  hours. BNP (last 3 results) No results for input(s): PROBNP in the last 8760 hours. HbA1C: No results for input(s): HGBA1C in the last 72 hours. CBG: Recent Labs  Lab 02/07/17 1643 02/07/17 2013 02/08/17 0015 02/08/17 0431 02/08/17 0746  GLUCAP 332* 143* 98 228* 173*   Lipid Profile: No results for input(s): CHOL, HDL, LDLCALC, TRIG, CHOLHDL, LDLDIRECT in the last 72 hours. Thyroid Function Tests: No results for input(s): TSH, T4TOTAL, FREET4, T3FREE, THYROIDAB in the last 72 hours. Anemia Panel: No results for input(s): VITAMINB12, FOLATE, FERRITIN, TIBC, IRON, RETICCTPCT in the last 72 hours. Sepsis Labs: No results for input(s): PROCALCITON, LATICACIDVEN in the last 168 hours.  No results found for this or any previous visit (from the past 240 hour(s)).       Radiology Studies: No results found.      Scheduled Meds: . chlorhexidine gluconate (MEDLINE KIT)  15 mL Mouth Rinse BID  . citalopram  20 mg Oral Daily  . collagenase   Topical Daily  . enoxaparin (LOVENOX) injection  40 mg Subcutaneous Daily  . feeding supplement  1 Container Oral TID BM  . feeding supplement (PRO-STAT SUGAR FREE 64)  30 mL Oral BID  . fluconazole  100 mg Oral Q24H  . Gerhardt's butt cream   Topical TID  . insulin aspart  0-20 Units Subcutaneous Q4H  . mouth rinse  15 mL Mouth Rinse QID   Continuous Infusions: . magnesium sulfate 1 - 4 g bolus IVPB    . potassium chloride       LOS: 21 days    Time spent: 40 mins    Irine Seal, MD [facility] Pager [phone]  If 7PM-7AM, please contact night-coverage [email or web address] Password [email or web address] 02/08/2017, 11:18 AM

## 2017-02-08 NOTE — Progress Notes (Addendum)
[facility]

## 2017-02-08 NOTE — Progress Notes (Signed)
Spoke with pt and wife at bedside along with CSW. Options given of HH, Wound Care Center or self pay for SNF. Pt's wife work during the day, unable to be at home. [facility]17 at 0730 AM. Will follow up in the AM with pt and wife for discharge plans. Wife states she is working on Community education officerinsurance for pt. Pt will check to see if someone can take him to his appointments. Pt also states that he will try to move and to walk. Will continue to follow.

## 2017-02-08 NOTE — Clinical Social Work Note (Signed)
Clinical Social Work Assessment  Patient Details  Name: [name] MRN: [id number] Date of Birth: 04/28/1965  Date of referral:  02/08/17               Reason for consult:  Facility Placement                Permission sought to share information with:  Family Supports Permission granted to share information::     Name::     [name]  Agency::     Relationship::  Wife  Contact Information:  [phone]  Housing/Transportation Living arrangements for the past 2 months:  Single Family Home Source of Information:  Patient Patient Interpreter Needed:  None Criminal Activity/Legal Involvement Pertinent to Current Situation/Hospitalization:  No - Comment as needed Significant Relationships:  Dependent Children, Spouse Lives with:  Spouse, Other (Comment)(youngest son) Do you feel safe going back to the place where you live?  Yes Need for family participation in patient care:  Yes (Comment)  Care giving concerns:  Patient from home with wife. Patient reported that prior to hospitalization that he was independent with ADLs and was able to ambulate around his home with no assistance. Patient reported that he got pneumonia and started feeling weak. CSW inquired about patient's wound care prior to hospitalization, patient reported that he did not know he had wounds until he got to the hospital. PT recommending SNF for physical therapy, hydrotherapy was discontinued.    Social Worker assessment / plan:  CSW spoke with patient at bedside regarding discharge planning. CSW informed patient that PT recommended SNF for ST rehab. CSW inquired about patient's interest in SNF for ST rehab, patient reported "guess so". CSW inquired if patient was able to private pay for SNF since he did not have insurance, patient reported that he was unsure. 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 CSW inquired about patient's support at home. Patient reported that he resides  with his wife and youngest son, noting his wife is supportive. CSW inquired if patient is unable to private pay for SNF will he be able to dc home, patient reported "guess so". Patient reported that CSW can speak with his wife further about discharge planning.   CSW contacted patient's wife to discuss discharge planning, patient's wife reported that she was driving and requested to speak with CSW when she got off the road.    CSW informed by patient's RN that patient's wife was now at the hospital. CSW spoke with patient/patient's wife along with CM regarding dc planning. Patient's wife reported that she is unsure about dc plans, noting she works 6-3 daily and no one is available to be with patient. CSW inquired about ability to private pay for SNF, patient's wife reported that she is unsure. Patient's wife reported that she is in the process of trying to get insurance for patient. Patient/patient's wife agreeable to SNF if it is a possibility.   Patient's wife agreed to follow up with CSW/CM about insurance and ability to private pay for SNF.  CSW will continue to follow and assist with discharge planning.  Employment status:  Unemployed Health and safety inspector:  Self Pay (Medicaid Pending) PT Recommendations:  Skilled Nursing Facility Information / Referral to community resources:     Patient/Family's Response to care:  Patient was very nonchalant throughout assessment. Patient uncertain about discharge plans and care needs.    Patient/Family's Understanding of and Emotional Response to Diagnosis, Current Treatment, and Prognosis:  Patient presented calm and was dismissive throughout assessment. Patient verbalized uncertainty about discharge plans, answering multiple questions with "guess so". Patient reported that he has been depressed since losing his job in August 2017. Patient reported that he has not been doing much since he lost his job. CSW provided active listening and discussed patient's  support system/coping skills. Patient reported that his wife is supportive and that he enjoys spending time with his family. Patient reported that he also likes to build things, but he hasn't done so in a while. CSW encouraged patient to start doing the things that brings him joy.      Emotional Assessment Appearance:  Appears stated age Attitude/Demeanor/Rapport:  Other(Dismissive) Affect (typically observed):  Calm Orientation:  Oriented to Self, Oriented to Place, Oriented to  Time, Oriented to Situation Alcohol / Substance use:  Not Applicable Psych involvement (Current and /or in the community):  No (Comment)  Discharge Needs  Concerns to be addressed:  Care Coordination Readmission within the last 30 days:  No Current discharge risk:  Physical Impairment Barriers to Discharge:  Continued Medical Work up   USG CorporationKimberly L Adalin Vanderploeg, LCSW 02/08/2017, 3:33 PM

## 2017-02-09 LAB — MAGNESIUM: Magnesium: 2 mg/dL (ref 1.7–2.4)

## 2017-02-09 LAB — GLUCOSE, CAPILLARY
GLUCOSE-CAPILLARY: 167 mg/dL — AB (ref 65–99)
GLUCOSE-CAPILLARY: 172 mg/dL — AB (ref 65–99)
GLUCOSE-CAPILLARY: 236 mg/dL — AB (ref 65–99)
GLUCOSE-CAPILLARY: 262 mg/dL — AB (ref 65–99)
GLUCOSE-CAPILLARY: 272 mg/dL — AB (ref 65–99)
Glucose-Capillary: 104 mg/dL — ABNORMAL HIGH (ref 65–99)
Glucose-Capillary: 183 mg/dL — ABNORMAL HIGH (ref 65–99)

## 2017-02-09 LAB — CBC WITH DIFFERENTIAL/PLATELET
BASOS ABS: 0 10*3/uL (ref 0.0–0.1)
BASOS PCT: 1 %
Eosinophils Absolute: 0.8 10*3/uL — ABNORMAL HIGH (ref 0.0–0.7)
Eosinophils Relative: 10 %
HCT: 25.2 % — ABNORMAL LOW (ref 39.0–52.0)
HEMOGLOBIN: 8.2 g/dL — AB (ref 13.0–17.0)
LYMPHS ABS: 1.9 10*3/uL (ref 0.7–4.0)
Lymphocytes Relative: 22 %
MCH: 27.4 pg (ref 26.0–34.0)
MCHC: 32.5 g/dL (ref 30.0–36.0)
MCV: 84.3 fL (ref 78.0–100.0)
MONO ABS: 0.7 10*3/uL (ref 0.1–1.0)
MONOS PCT: 8 %
Neutro Abs: 5.2 10*3/uL (ref 1.7–7.7)
Neutrophils Relative %: 61 %
Platelets: 329 10*3/uL (ref 150–400)
RBC: 2.99 MIL/uL — ABNORMAL LOW (ref 4.22–5.81)
RDW: 13.9 % (ref 11.5–15.5)
WBC: 8.5 10*3/uL (ref 4.0–10.5)

## 2017-02-09 LAB — BASIC METABOLIC PANEL
ANION GAP: 5 (ref 5–15)
BUN: 12 mg/dL (ref 6–20)
CHLORIDE: 108 mmol/L (ref 101–111)
CO2: 24 mmol/L (ref 22–32)
Calcium: 7.4 mg/dL — ABNORMAL LOW (ref 8.9–10.3)
Creatinine, Ser: 0.83 mg/dL (ref 0.61–1.24)
GFR calc non Af Amer: >60 mL/min (ref >60)
GLUCOSE: 103 mg/dL — AB (ref 65–99)
Potassium: 3.2 mmol/L — ABNORMAL LOW (ref 3.5–5.1)
Sodium: 137 mmol/L (ref 135–145)

## 2017-02-09 MED ORDER — POTASSIUM CHLORIDE CRYS ER 20 MEQ PO TBCR
40.0000 meq | EXTENDED_RELEASE_TABLET | ORAL | Status: AC
  Administered 2017-02-09 (×2): 40 meq via ORAL
  Filled 2017-02-09 (×2): qty 2

## 2017-02-09 MED ORDER — CHOLESTYRAMINE LIGHT 4 G PO PACK
4.0000 g | PACK | Freq: Two times a day (BID) | ORAL | Status: DC
  Administered 2017-02-09 – 2017-02-11 (×5): 4 g via ORAL
  Filled 2017-02-09 (×5): qty 1

## 2017-02-09 NOTE — Progress Notes (Signed)
Texted wife today to ask for information concerning [employer] insurance.  Waiting for return text or call. Wife is at work.

## 2017-02-09 NOTE — Progress Notes (Signed)
PROGRESS NOTE    [name]  OVF:[id number] DOB: April 14, 1965 DOA: 01/18/2017 PCP: [name], MD    Brief Narrative:  52 y.o.malewith a history of obesity, diabetes, hypertension, hyperlipidemia, obstructive sleep apnea, atrial fib/flutter with a history of prior cardioversion. He was brought in by EMS and was noted to be hyperglycemic, encephalopathic, hypotensive. found to be in DKA and sepsis due to pneumonia.Due to overall decline, patient was intubated for airway protection on 12/19. He was admitted to ICU. [facility] hospitalist assumed care 12/30.  02/02/2017-placed foley backas he was not able to void. 02/03/2017-severe aspiration risk for oropharyngeal dysphagia and pharyngeal esophageal dysphagia.Discussed reports with the patient today and last night. Last night he reported that he want to think about it and he will give me an answer today. He states that he wants to eat,knowing that his chances of recurrent aspiration and recurrent pneumonia and being on life support may be a vicious cycle. He stated that he understand the consequences and he will face at as it comes. However he wants to remain a full code and wants to start eating as soon as possible. He also reports that he has been this way for many weeks or months.He reported that he would not be interested in an NG tube or PEG tube at this time.  02/04/2017 patient's wife by the bedside reports that patient ate everything her breakfast without any trouble this morning. Patient denies any complaints chest pain shortness of breath nausea vomiting or diarrhea.  02/06/2017-speech notes reviewed.patient denies sob.cough earlier when I saw him.  02/07/2017 patient remains in bed he has a rectal tube and a Foley catheter in place.  He denied shortness of breath or chest pain.  Patient seen by wound care as well as PT and undergoing hydrotherapy.  General surgery consulted.    Assessment & Plan:   Active  Problems:   Diabetic ketoacidosis with coma associated with type 2 diabetes mellitus ([place])   Pneumonia   Malnutrition of moderate degree   Pressure injury of skin   Acute respiratory failure with hypoxia (HCC)   Severe sepsis with septic shock (HCC)   Type 2 diabetes mellitus with stage 3 chronic kidney disease (HCC)   Hypernatremia   Hypokalemia   Sacral decubitus ulcer, stage III (HCC)   ARDS (adult respiratory distress syndrome) (HCC)   Acute metabolic encephalopathy   AKI (acute kidney injury) ([place])   Adjustment disorder with depressed mood   Palliative care by specialist   Goals of care, counseling/discussion   Advance care planning  Acute respiratory failure with hypoxia due to pneumonia and And ARDS: Intubated on 01/18/2017, then extubated on 01/26/2017 and transitioned to BiPAP. Physical therapy evaluated the patient the recommended skilled nursing facility versus LTAC. chest xray 12/31-.Increasing opacities in right greater than left lung bases, likely atelectasis and/or pneumonia. Probable small right effusion.follow up chest xray today.Increasing opacities in right greater than left lung bases, likely atelectasis and/or pneumonia. Probable small right effusion.with clinical improvement.  Patient currently afebrile.  Normal white count.  Patient with a urine output of 1.45 L over the past 24 hours.  Patient is -13.720 L during this hospitalization.  Patient status post IV Lasix.  Status post course of IV antibiotics.  Supportive care.   Hypokalemia  Replete.  Magnesium has been repleted and currently at 2.0 from 1.2.    Septic shock secondary to community-acquired pneumonia: Patient on admission had to be placed on pressors due to septic shock and was  pancultured.  Patient was subsequently placed empirically on IV antibiotics which have been narrowed down.  Now off pressors, sputum cultures with moderate yeast. Blood cultures 1 out of 2 showed contaminant. Patient status  post completion of a course of antibiotics.  No further antibiotics needed at this time.  Acute kidney injury: Likely due to prerenal etiology.  Improved with hydration.  Diabetic ketoacidosis with coma associated with type 2 diabetes mellitus ([place]) On admission his blood sugar was 800 with a gap and a pH of 7.1. He was started on glucose stabilizer, IV fluids with resolution of DKA.  Patient subsequently transitioned to long-acting insulin and sliding scale insulin.  Due to persistently low blood sugar levels long-acting Lantus has been discontinued.  Hemoglobin A1c was greater than 15.5 on 01/30/2017.  CBGs are ranging from 104-272.  Continue sliding scale insulin.    Dysphagia: Speech evaluated the patient last time on 01/27/2017 and recommended n.p.o. ice chips as needed. Formal swallowing evaluation showed moderate to severe dysphagia, they recommended to keep the patient n.p.o.patienthad MBS 02/02/2017.With severe aspiration.Patient requested to give him food to eat knowing consequences that he could get into recurrent pneumonia respiratory failure and needing intubation. This was discussed in detail with the patient. And he wanted to remain full code. Patient has been reassessed by speech therapy who feel patient is making progress with improved secretion management.  Respiratory muscle strength training to maximize respiratory/swallow rehabilitation was demonstrated to the patient by speech therapy.  Diet has been advanced to full liquid diet per speech therapy recommendations which patient states he is tolerating.  Severe protein calorie malnutrition with albumin of 1.6 Patient with significant aspiration concerns due to dysphagia noted by speech therapy.  Tolerating full liquid diet.  Follow.  DTIpressure injury to the sacral area/stage II/III sacral decubitus  followed by PT for hydrotherapy and packing and debridement with Santyl. Patient has been seen in consultation by  general surgery who recommend to continue ongoing hydrotherapy.  Gerhardt's butt cream has been ordered.  Also noted that there were concerns for possible fungal rash and as such patient was started on Diflucan.  Continue IV Diflucan.  PT hydrotherapy has been discontinued and patient now with nursing dressing changes.  Patient will likely need to follow-up on the wound care center on discharge.  Appreciate general surgical input and recommendations.   Probable depression Patient has been started on Celexa.  Follow.  Will need outpatient follow-up.      DVT prophylaxis: Lovenox Code Status: Full Family Communication: Updated patient and father at bedside..  No family at bedside. Disposition Plan: Awaiting skilled nursing facility versus LTAC bed.   Consultants:   General surgery: Dr.Toth III 02/07/2017  Wound care 01/25/2017  Procedures:   CT head without contrast 01/23/2017  CT abdomen and pelvis 01/19/2017  Chest x-ray 02/05/2017, 01/30/2017  Antimicrobials:   IV Unasyn 01/31/2017>>>> 02/07/2017 Zithromax 12/19 >> 12/22 Rocephin 12/19 >> 12/19 Zosyn 12/19 >>  Vancomycin 12/20 >> 12/22  Lines/tubes: ETT 12/19 >> 12/27 Femoral CVL 12/19 >> 12/24   Events: 12/19 Admit 12/20 ARDS protocol, start nimbex 12/22 Off nimbex 12/23 Off pressors, sedation  Urine 12/19 >> negative Blood 12/19 >> coag neg Staph Sputum 12/21 >> Candida Glabrata   Subjective: Patient laying in bed.  Patient states dysphasia improving.  Patient denies any coughing or choking with oral intake.  Patient denies any chest pain.  No shortness of breath.  Still insistent on being allowed to eat. Patient is tolerating full  liquid diet.  Objective: Vitals:   02/08/17 0442 02/08/17 1549 02/08/17 2050 02/09/17 0514  BP: 110/65 (!) 112/58 (!) 107/57 110/60  Pulse: (!) 101 94 85 88  Resp: 19 18 20 20   Temp: 98.3 F (36.8 C) 98.5 F (36.9 C) 98 F (36.7 C) 98.8 F (37.1 C)  TempSrc: Oral Oral Oral  Oral  SpO2: 98% 98% 96% 96%  Weight:      Height:        Intake/Output Summary (Last 24 hours) at 02/09/2017 1013 Last data filed at 02/09/2017 0900 Gross per 24 hour  Intake 610 ml  Output 1450 ml  Net -840 ml   Filed Weights   02/05/17 0500 02/06/17 0510 02/07/17 0700  Weight: 78.5 kg (173 lb 1 oz) 76.7 kg (169 lb 1.5 oz) 75.5 kg (166 lb 7.2 oz)    Examination:  General exam: Appears calm and comfortable  Respiratory system: Clear to auscultation.  No wheezes, no crackles, no rhonchi.  Respiratory effort normal. Cardiovascular system: The rate and rhythm no murmurs rubs or gallops.  No JVD.  No pedal edema.  Gastrointestinal system: Abdomen is soft, nontender, nondistended, positive bowel sounds.   Central nervous system: Alert and oriented. No focal neurological deficits. Extremities: Symmetric 5 x 5 power. Skin: Stage II decubitus ulcer sacrum with red erythematous almost yeastlike rash over buttocks and thighs of both legs.  Genitourinary: Rectal tube in place. Psychiatry: Judgement and insight appear fair. Mood & affect flat.     Data Reviewed: I have personally reviewed following labs and imaging studies  CBC: Recent Labs  Lab 02/03/17 0254 02/08/17 0842 02/09/17 0406  WBC 5.1 9.0 8.5  NEUTROABS  --  6.4 5.2  HGB 9.5* 8.8* 8.2*  HCT 29.5* 27.0* 25.2*  MCV 86.0 84.6 84.3  PLT 277 303 275   Basic Metabolic Panel: Recent Labs  Lab 02/03/17 0254 02/05/17 0950 02/08/17 0842 02/09/17 0406  NA 141 136 137 137  K 2.7* 3.4* 3.6 3.2*  CL 108 106 106 108  CO2 26 23 22 24   GLUCOSE 105* 207* 191* 103*  BUN 15 8 13 12   CREATININE 0.97 0.92 1.00 0.83  CALCIUM 6.9* 6.5* 7.2* 7.4*  MG 1.3*  --  1.2* 2.0   GFR: Estimated Creatinine Clearance: 102.9 mL/min (by C-G formula based on SCr of 0.83 mg/dL). Liver Function Tests: Recent Labs  Lab 02/03/17 0254 02/08/17 0842  AST 21 15  ALT 19 11*  ALKPHOS 270* 154*  BILITOT 1.0 0.4  PROT 7.1 7.1  ALBUMIN 1.6*  1.6*   No results for input(s): LIPASE, AMYLASE in the last 168 hours. No results for input(s): AMMONIA in the last 168 hours. Coagulation Profile: No results for input(s): INR, PROTIME in the last 168 hours. Cardiac Enzymes: No results for input(s): CKTOTAL, CKMB, CKMBINDEX, TROPONINI in the last 168 hours. BNP (last 3 results) No results for input(s): PROBNP in the last 8760 hours. HbA1C: No results for input(s): HGBA1C in the last 72 hours. CBG: Recent Labs  Lab 02/08/17 1721 02/08/17 2049 02/09/17 0004 02/09/17 0511 02/09/17 0742  GLUCAP 185* 207* 183* 104* 172*   Lipid Profile: No results for input(s): CHOL, HDL, LDLCALC, TRIG, CHOLHDL, LDLDIRECT in the last 72 hours. Thyroid Function Tests: No results for input(s): TSH, T4TOTAL, FREET4, T3FREE, THYROIDAB in the last 72 hours. Anemia Panel: No results for input(s): VITAMINB12, FOLATE, FERRITIN, TIBC, IRON, RETICCTPCT in the last 72 hours. Sepsis Labs: No results for input(s): PROCALCITON, LATICACIDVEN in the  last 168 hours.  No results found for this or any previous visit (from the past 240 hour(s)).       Radiology Studies: No results found.      Scheduled Meds: . chlorhexidine gluconate (MEDLINE KIT)  15 mL Mouth Rinse BID  . cholestyramine light  4 g Oral BID  . citalopram  20 mg Oral Daily  . collagenase   Topical Daily  . enoxaparin (LOVENOX) injection  40 mg Subcutaneous Daily  . feeding supplement  1 Container Oral TID BM  . feeding supplement (PRO-STAT SUGAR FREE 64)  30 mL Oral BID  . Gerhardt's butt cream   Topical TID  . insulin aspart  0-20 Units Subcutaneous Q4H  . mouth rinse  15 mL Mouth Rinse QID  . potassium chloride SA  40 mEq Oral Q4H   Continuous Infusions: . fluconazole (DIFLUCAN) IV Stopped (02/08/17 2201)     LOS: 22 days    Time spent: 40 mins    Irine Seal, MD [facility] Pager [phone]  If 7PM-7AM, please contact  night-coverage [email or web address] Password [email or web address] 02/09/2017, 10:13 AM

## 2017-02-09 NOTE — Progress Notes (Signed)
CSW following to assist with discharge planning. PT recommending SNF.   CSW informed by CM that patient now has insurance through his wife's job.   CSW informed by CM that patient's wife reported that she was in route to the hospital with patient's insurance information.   CSW completed FL2 and PASRR.  CSW will continue to follow and assist with discharge planning.   Celso SickleKimberly Dmarco Baldus, [place] Clinical Social Worker [facility] Cell#: [phone]

## 2017-02-09 NOTE — NC FL2 (Signed)
[place] LEVEL OF CARE SCREENING TOOL     IDENTIFICATION  Patient Name: [name] Birthdate: November 24, 1965 Sex: male Admission Date (Current Location): 01/18/2017  [facility] and [place] Number:  Herbalist and Address:  [facility],  [address] [place], [place]      Provider Number: [id number]  Attending Physician Name and Address:  Eugenie Filler, MD  Relative Name and Phone Number:       Current Level of Care: Hospital Recommended Level of Care: [place] Prior Approval Number:    Date Approved/Denied:   PASRR Number: [id number]  Discharge Plan: SNF    Current Diagnoses: Patient Active Problem List   Diagnosis Date Noted  . Adjustment disorder with depressed mood   . Palliative care by specialist   . Goals of care, counseling/discussion   . Advance care planning   . Acute respiratory failure with hypoxia ([place]) 01/30/2017  . Severe sepsis with septic shock ([place]) 01/30/2017  . Type 2 diabetes mellitus with stage 3 chronic kidney disease ([place]) 01/30/2017  . Hypernatremia 01/30/2017  . Hypokalemia 01/30/2017  . Sacral decubitus ulcer, stage III ([place]) 01/30/2017  . ARDS (adult respiratory distress syndrome) ([place]) 01/30/2017  . Acute metabolic encephalopathy 24/82/5003  . AKI (acute kidney injury) ([place]) 01/30/2017  . Pressure injury of skin 01/22/2017  . Malnutrition of moderate degree 01/19/2017  . Pneumonia 01/18/2017  . Atrial flutter ([place]) 09/19/2011  . Obesity 09/19/2011  . Diabetic ketoacidosis with coma associated with type 2 diabetes mellitus ([place]) 09/19/2011  . Mixed hyperlipidemia 09/19/2011  . Hypertension   . Chronic kidney disease   . Pancreatitis   . Elevated cholesterol with elevated triglycerides   . Sleep apnea   . Vitreous hemorrhage ([place]) 08/03/2011    Orientation RESPIRATION BLADDER Height & Weight     Self, Time, Situation, Place  O2, Other (Comment)(Bipap  Adult Full Face  Mask Size Medium  Flow rate 2lpm Respiratory Rate 18 breaths/minute  IPAP 16cmH20  EPAP  8cmH20) Continent Weight: 166 lb 7.2 oz (75.5 kg) Height:  _0  (175.3 cm)  BEHAVIORAL SYMPTOMS/MOOD NEUROLOGICAL BOWEL NUTRITION STATUS      Incontinent Diet(See dc summary)  AMBULATORY STATUS COMMUNICATION OF NEEDS Skin   Extensive Assist Verbally PU Stage and Appropriate Care, Other (Comment)(PressureInjuryUnstageable- Location: Coccyx Location Orientation: Left;Lower;Medial;Right Moist to dry;Foam Dressing changed daily      PressureInjuryLocation: Coccyx Staging: Deep Tissue Injury   Moist to Dry,Gauze,Foam Dressing changed daily)   PU Stage 2 Dressing: Daily( Location: Ear Location Orientation: Right   Foam Dressing changed daily)                   Personal Care Assistance Level of Assistance  Bathing, Feeding, Dressing Bathing Assistance: Maximum assistance Feeding assistance: Independent Dressing Assistance: Maximum assistance     Functional Limitations Info  Sight, Hearing, Speech Sight Info: Adequate Hearing Info: Adequate Speech Info: Adequate    SPECIAL CARE FACTORS FREQUENCY  PT (By licensed PT), OT (By licensed OT)     PT Frequency: 5x OT Frequency: 5x            Contractures Contractures Info: Not present    Additional Factors Info  Code Status, Allergies Code Status Info: Full Code Allergies Info: NKA           Current Medications (02/09/2017):  This is the current hospital active medication list Current Facility-Administered Medications  Medication Dose Route Frequency Provider Last Rate Last  Dose  . acetaminophen (TYLENOL) tablet 650 mg  650 mg Oral Q6H PRN Chesley Mires, MD       Or  . acetaminophen (TYLENOL) suppository 650 mg  650 mg Rectal Q4H PRN Chesley Mires, MD      . chlorhexidine gluconate (MEDLINE KIT) (PERIDEX) 0.12 % solution 15 mL  15 mL Mouth Rinse BID Collene Gobble, MD   15 mL at 02/09/17 0900  . cholestyramine light (PREVALITE) packet  4 g  4 g Oral BID Eugenie Filler, MD      . citalopram (CELEXA) tablet 20 mg  20 mg Oral Daily Earlie Counts, NP   20 mg at 02/09/17 0911  . collagenase (SANTYL) ointment   Topical Daily Charlynne Cousins, MD      . enoxaparin (LOVENOX) injection 40 mg  40 mg Subcutaneous Daily Georgette Shell, MD   40 mg at 02/09/17 0912  . feeding supplement (BOOST / RESOURCE BREEZE) liquid 1 Container  1 Container Oral TID BM Georgette Shell, MD   1 Container at 02/08/17 2211  . feeding supplement (PRO-STAT SUGAR FREE 64) liquid 30 mL  30 mL Oral BID Georgette Shell, MD   30 mL at 02/09/17 0911  . fluconazole (DIFLUCAN) IVPB 100 mg  100 mg Intravenous Q24H Eugenie Filler, MD   Stopped at 02/08/17 2201  . Gerhardt's butt cream   Topical TID Earnstine Regal, PA-C      . hydrALAZINE (APRESOLINE) injection 10 mg  10 mg Intravenous Q4H PRN Chesley Mires, MD      . insulin aspart (novoLOG) injection 0-20 Units  0-20 Units Subcutaneous Q4H Collene Gobble, MD   11 Units at 02/09/17 1255  . iopamidol (ISOVUE-300) 61 % injection 15 mL  15 mL Oral BID PRN Collene Gobble, MD      . labetalol (NORMODYNE,TRANDATE) injection 10 mg  10 mg Intravenous Q4H PRN Berton Mount, RPH   10 mg at 01/23/17 0911  . MEDLINE mouth rinse  15 mL Mouth Rinse QID Collene Gobble, MD   15 mL at 02/08/17 1840  . ondansetron (ZOFRAN) injection 4 mg  4 mg Intravenous Q6H PRN Chesley Mires, MD   4 mg at 01/26/17 1038     Discharge Medications: Please see discharge summary for a list of discharge medications.  Relevant Imaging Results:  Relevant Lab Results:   Additional Information SSN     [id number]  Burnis Medin, LCSW

## 2017-02-09 NOTE — Progress Notes (Signed)
Physical Therapy Treatment Patient Details Name: Gregory MasonKenneth D Horn MRN: [id number] DOB: 03/29/1965 Today's Date: 02/09/2017    History of Present Illness 52 year old man with a history of obesity, diabetes, hypertension, hyperlipidemia, obstructive sleep apnea, atrial fib/flutter with a history of prior cardioversion.  He called his son on the date of admission because he was feeling weak and unwell, reportedly had not been eating or drinking several days prior to admission;  He was brought by EMS to ED and was noted to be hypoglycemic, encephalopathic, hypotensive.  Pt was intubated in the ED on 12/19, EXT ?12/23; pt with multiple pressure injuries on admission    PT Comments    Very limited activity tolerance.  Required + 2 to sit EOB only 30 sec.   Supine     BP 101/67  HR 99    3lts 97% EOB         BP 90/57    HR 111   3 lts 98%   Max c/o dizziness and buttock pain    Follow Up Recommendations  SNF     Equipment Recommendations  Hospital bed;Wheelchair (measurements PT);Rolling walker with 5" wheels;3in1 (PT)    Recommendations for Other Services       Precautions / Restrictions Precautions Precautions: Fall Precaution Comments: flexiseal, multiple lines, coccyx wound  Monitor vitals Restrictions Weight Bearing Restrictions: No    Mobility  Bed Mobility Overal bed mobility: Needs Assistance Bed Mobility: Rolling;Supine to Sit;Sit to Sidelying Rolling: Mod assist Sidelying to sit: Max assist;+2 for physical assistance;+2 for safety/equipment Supine to sit: Mod assist;+2 for physical assistance;+2 for safety/equipment;HOB elevated     General bed mobility comments: only able to sit EOB x 30 sec with MAX c/o dizziness and buttock pain (pressure).  BP EOB 90/57    Pt unable to support self upright   Pt unable to move legs to adjust to posture  Transfers                 General transfer comment: unable to attempt  Ambulation/Gait                 Stairs             Wheelchair Mobility    Modified Rankin (Stroke Patients Only)       Balance                                            Cognition Arousal/Alertness: Awake/alert Behavior During Therapy: Flat affect Overall Cognitive Status: No family/caregiver present to determine baseline cognitive functioning Area of Impairment: Orientation;Attention;Following commands;Problem solving;Safety/judgement                               General Comments: appears very ill      Exercises      General Comments        Pertinent Vitals/Pain Pain Assessment: Faces Faces Pain Scale: Hurts little more Pain Location: bottom Pain Descriptors / Indicators: Discomfort;Guarding Pain Intervention(s): Repositioned    Home Living                      Prior Function            PT Goals (current goals can now be found in the care plan section)      Frequency  Min 2X/week      PT Plan Current plan remains appropriate    Co-evaluation              AM-PAC PT "6 Clicks" Daily Activity  Outcome Measure  Difficulty turning over in bed (including adjusting bedclothes, sheets and blankets)?: Unable Difficulty moving from lying on back to sitting on the side of the bed? : Unable Difficulty sitting down on and standing up from a chair with arms (e.g., wheelchair, bedside commode, etc,.)?: Unable Help needed moving to and from a bed to chair (including a wheelchair)?: Total Help needed walking in hospital room?: Total Help needed climbing 3-5 steps with a railing? : Total 6 Click Score: 6    End of Session Equipment Utilized During Treatment: Gait belt   Patient left: in bed;with call bell/phone within reach;with family/visitor present;with bed alarm set Nurse Communication: Mobility status PT Visit Diagnosis: Muscle weakness (generalized) (M62.81);Adult, failure to thrive (R62.7);Difficulty in walking, not elsewhere classified  (R26.2)     Time: 1610-9604 PT Time Calculation (min) (ACUTE ONLY): 24 min  Charges:  $Therapeutic Activity: 23-37 mins                    G Codes:       Felecia Shelling  PTA [facility]  Acute  Rehab Pager      [phone]

## 2017-02-09 NOTE — Progress Notes (Signed)
Spoke with patients spouse Reynolds BowlDeborah Grabel regarding insurance verification. Spouse made aware that member ID number would be needed to verify benefits. Will place confirmation statement on chart.

## 2017-02-09 NOTE — Progress Notes (Signed)
Occupational Therapy Treatment Patient Details Name: Gregory MasonKenneth D Clason MRN: [id number] DOB: 07/09/1965 Today's Date: 02/09/2017- Late entry for 1/9       OT comments  Pt is self limiting in regards to therapy.  Max encouragement with limited participation  Follow Up Recommendations  SNF    Equipment Recommendations  None recommended by OT    Recommendations for Other Services      Precautions / Restrictions Precautions Precautions: Fall Precaution Comments: flexiseal, multiple lines, coccyx wound Restrictions Weight Bearing Restrictions: No              ADL either performed or assessed with clinical judgement   ADL Overall ADL's : Needs assistance/impaired                                       General ADL Comments: OT sesion focused on BUE strengthening and ROM.  Used yellow theraband for BUE exercise. Pt again needed much encouragement. OT explained reasoning for BUE exercise  in regards to ADL activity .                 Cognition Arousal/Alertness: Awake/alert Behavior During Therapy: Flat affect Overall Cognitive Status: No family/caregiver present to determine baseline cognitive functioning                                 General Comments: poor motivation         Exercises General Exercises - Upper Extremity Shoulder Flexion: AROM;Theraband;Strengthening;Supine Theraband Level (Shoulder Flexion): Level 1 (Yellow) Shoulder Extension: AROM;Supine;10 reps Elbow Flexion: AROM;10 reps;Theraband;Supine Elbow Extension: PROM;Supine;10 reps;Both   Shoulder Instructions            Pertinent Vitals/ Pain       Pain Assessment: No/denies pain     Prior Functioning/Environment              Frequency           Progress Toward Goals  OT Goals(current goals can now be found in the care plan section)  Progress towards OT goals: Not progressing toward goals - comment(pt is self limiting)     Plan Discharge plan  remains appropriate    Co-evaluation                 AM-PAC PT "6 Clicks" Daily Activity     Outcome Measure   Help from another person eating meals?: A Little Help from another person taking care of personal grooming?: A Little Help from another person toileting, which includes using toliet, bedpan, or urinal?: Total Help from another person bathing (including washing, rinsing, drying)?: A Lot Help from another person to put on and taking off regular upper body clothing?: Total Help from another person to put on and taking off regular lower body clothing?: Total 6 Click Score: 11    End of Session        Activity Tolerance Other (comment)(feel pt is self limiting.)   Patient Left in bed;with bed alarm set   Nurse Communication Mobility status        Time:  - [id number]- 1432-1444    Charges:  1 TE. I OT visit  Lise AuerLori Tamekia Rotter, [place] [phone]   Einar CrowEDDING, Meriem Lemieux D 02/09/2017, 9:04 AM

## 2017-02-09 NOTE — Progress Notes (Signed)
Subjective/Chief Complaint: No complaints   Objective: Vital signs in last 24 hours: Temp:  [98 F (36.7 C)-98.8 F (37.1 C)] 98.8 F (37.1 C) (01/10 0514) Pulse Rate:  [85-94] 88 (01/10 0514) Resp:  [18-20] 20 (01/10 0514) BP: (107-112)/(57-60) 110/60 (01/10 0514) SpO2:  [96 %-98 %] 96 % (01/10 0514) Last BM Date: 02/09/17  Intake/Output from previous day: 01/09 0701 - 01/10 0700 In: 490 [P.O.:240; IV Piggyback:150] Out: 1450 [Urine:1450] Intake/Output this shift: Total I/O In: 120 [P.O.:120] Out: -   General appearance: alert and cooperative Resp: clear to auscultation bilaterally Cardio: regular rate and rhythm GI: soft, non-tender; bowel sounds normal; no masses,  no organomegaly Skin: sacral and buttock area has necrotic skin but no obvious pus underneath  Lab Results:  Recent Labs    02/08/17 0842 02/09/17 0406  WBC 9.0 8.5  HGB 8.8* 8.2*  HCT 27.0* 25.2*  PLT 303 329   BMET Recent Labs    02/08/17 0842 02/09/17 0406  NA 137 137  K 3.6 3.2*  CL 106 108  CO2 22 24  GLUCOSE 191* 103*  BUN 13 12  CREATININE 1.00 0.83  CALCIUM 7.2* 7.4*   PT/INR No results for input(s): LABPROT, INR in the last 72 hours. ABG No results for input(s): PHART, HCO3 in the last 72 hours.  Invalid input(s): PCO2, PO2  Studies/Results: No results found.  Anti-infectives: Anti-infectives (From admission, onward)   Start     Dose/Rate Route Frequency Ordered Stop   02/08/17 1800  fluconazole (DIFLUCAN) IVPB 100 mg     100 mg 50 mL/hr over 60 Minutes Intravenous Every 24 hours 02/08/17 1741     02/07/17 1800  fluconazole (DIFLUCAN) 40 MG/ML suspension 100 mg  Status:  Discontinued     100 mg Oral Every 24 hours 02/07/17 1715 02/08/17 1741   02/07/17 1715  fluconazole (DIFLUCAN) NICU  ORAL  syringe 10 mg/mL  Status:  Discontinued     100 mg Oral Every 24 hours 02/07/17 1703 02/07/17 1714   01/31/17 0730  ampicillin-sulbactam (UNASYN) 1.5 g in sodium chloride 0.9  % 50 mL IVPB     1.5 g 100 mL/hr over 30 Minutes Intravenous Every 6 hours 01/31/17 0705 02/07/17 2030   01/21/17 1000  vancomycin (VANCOCIN) 1,500 mg in sodium chloride 0.9 % 500 mL IVPB  Status:  Discontinued     1,500 mg 250 mL/hr over 120 Minutes Intravenous Every 48 hours 01/20/17 1001 01/21/17 0904   01/20/17 1200  vancomycin (VANCOCIN) IVPB 1000 mg/200 mL premix  Status:  Discontinued     1,000 mg 200 mL/hr over 60 Minutes Intravenous Every 24 hours 01/19/17 1056 01/20/17 1001   01/19/17 1400  azithromycin (ZITHROMAX) 500 mg in dextrose 5 % 250 mL IVPB  Status:  Discontinued     500 mg 250 mL/hr over 60 Minutes Intravenous Every 24 hours 01/18/17 1733 01/21/17 0909   01/19/17 1130  vancomycin (VANCOCIN) 1,500 mg in sodium chloride 0.9 % 500 mL IVPB     1,500 mg 250 mL/hr over 120 Minutes Intravenous  Once 01/19/17 1056 01/19/17 1330   01/19/17 0000  piperacillin-tazobactam (ZOSYN) IVPB 3.375 g     3.375 g 12.5 mL/hr over 240 Minutes Intravenous Every 8 hours 01/18/17 1746 01/25/17 1350   01/18/17 1600  piperacillin-tazobactam (ZOSYN) IVPB 3.375 g     3.375 g 100 mL/hr over 30 Minutes Intravenous  Once 01/18/17 1547 01/18/17 1812   01/18/17 1315  azithromycin (ZITHROMAX) 500 mg in  dextrose 5 % 250 mL IVPB     500 mg 250 mL/hr over 60 Minutes Intravenous  Once 01/18/17 1303 01/18/17 1523   01/18/17 1315  cefTRIAXone (ROCEPHIN) 2 g in dextrose 5 % 50 mL IVPB     2 g 100 mL/hr over 30 Minutes Intravenous  Once 01/18/17 1303 01/18/17 1405      Assessment/Plan: s/p * No surgery found * Would continue local wound care. The skin may slough on its own. No sign of infection yet to warrant debridement. Will follow  LOS: 22 days    TOTH III,PAUL S 02/09/2017

## 2017-02-10 ENCOUNTER — Inpatient Hospital Stay (HOSPITAL_COMMUNITY): Payer: BC Managed Care – PPO

## 2017-02-10 LAB — BASIC METABOLIC PANEL
Anion gap: 6 (ref 5–15)
BUN: 12 mg/dL (ref 6–20)
CHLORIDE: 107 mmol/L (ref 101–111)
CO2: 22 mmol/L (ref 22–32)
Calcium: 7.7 mg/dL — ABNORMAL LOW (ref 8.9–10.3)
Creatinine, Ser: 0.82 mg/dL (ref 0.61–1.24)
GFR calc Af Amer: >60 mL/min (ref >60)
GFR calc non Af Amer: >60 mL/min (ref >60)
Glucose, Bld: 264 mg/dL — ABNORMAL HIGH (ref 65–99)
POTASSIUM: 4.2 mmol/L (ref 3.5–5.1)
SODIUM: 135 mmol/L (ref 135–145)

## 2017-02-10 LAB — GLUCOSE, CAPILLARY
GLUCOSE-CAPILLARY: 282 mg/dL — AB (ref 65–99)
GLUCOSE-CAPILLARY: 251 mg/dL — AB (ref 65–99)
Glucose-Capillary: 105 mg/dL — ABNORMAL HIGH (ref 65–99)
Glucose-Capillary: 244 mg/dL — ABNORMAL HIGH (ref 65–99)
Glucose-Capillary: 239 mg/dL — ABNORMAL HIGH (ref 65–99)

## 2017-02-10 MED ORDER — SODIUM CHLORIDE 0.9 % IV SOLN
INTRAVENOUS | Status: AC
  Administered 2017-02-10 – 2017-02-11 (×2): via INTRAVENOUS

## 2017-02-10 MED ORDER — POTASSIUM CHLORIDE CRYS ER 20 MEQ PO TBCR: 40.0000 meq | EXTENDED_RELEASE_TABLET | ORAL | Status: DC

## 2017-02-10 MED ORDER — SODIUM CHLORIDE 0.9 % IV BOLUS (SEPSIS)
1000.0000 mL | Freq: Once | INTRAVENOUS | Status: AC
  Administered 2017-02-10: 1000 mL via INTRAVENOUS

## 2017-02-10 MED ORDER — CHLORHEXIDINE GLUCONATE 0.12 % MT SOLN
OROMUCOSAL | Status: AC
  Administered 2017-02-10: 15 mL
  Filled 2017-02-10: qty 15

## 2017-02-10 NOTE — Progress Notes (Signed)
PROGRESS NOTE    COREN SAGAN  TLX:[id number] DOB: 11-Sep-1965 DOA: 01/18/2017 PCP: [name], MD    Brief Narrative:  52 y.o.malewith a history of obesity, diabetes, hypertension, hyperlipidemia, obstructive sleep apnea, atrial fib/flutter with a history of prior cardioversion. He was brought in by EMS and was noted to be hyperglycemic, encephalopathic, hypotensive. found to be in DKA and sepsis due to pneumonia.Due to overall decline, patient was intubated for airway protection on 12/19. He was admitted to ICU. [facility] hospital[facility] assumed care 12/30.  02/02/2017-placed foley backas he was not able to void. 02/03/2017-severe aspiration risk for oropharyngeal dysphagia and pharyngeal esophageal dysphagia.Discussed reports with the patient today and last night. Last night he reported that he want to think about it and he will give me an answer today. He states that he wants to eat,knowing that his chances of recurrent aspiration and recurrent pneumonia and being on life support may be a vicious cycle. He stated that he understand the consequences and he will face at as it comes. However he wants to remain a full code and wants to start eating as soon as possible. He also reports that he has been this way for many weeks or months.He reported that he would not be interested in an NG tube or PEG tube at this time.  02/04/2017 patient's wife by the bedside reports that patient ate everything her breakfast without any trouble this morning. Patient denies any complaints chest pain shortness of breath nausea vomiting or diarrhea.  02/06/2017-speech notes reviewed.patient denies sob.cough earlier when I saw him.  02/07/2017 patient remains in bed he has a rectal tube and a Foley catheter in place.  He denied shortness of breath or chest pain.  Patient seen by wound care as well as PT and undergoing hydrotherapy.  General surgery consulted.    Assessment & Plan:   Active  Problems:   Diabetic ketoacidosis with coma associated with type 2 diabetes mellitus ([place])   Pneumonia   Malnutrition of moderate degree   Pressure injury of skin   Acute respiratory failure with hypoxia (HCC)   Severe sepsis with septic shock (HCC)   Type 2 diabetes mellitus with stage 3 chronic kidney disease (HCC)   Hypernatremia   Hypokalemia   Sacral decubitus ulcer, stage III (HCC)   ARDS (adult respiratory distress syndrome) (HCC)   Acute metabolic encephalopathy   AKI (acute kidney injury) ([place])   Adjustment disorder with depressed mood   Palliative care by specialist   Goals of care, counseling/discussion   Advance care planning  Acute respiratory failure with hypoxia due to pneumonia and And ARDS: Intubated on 01/18/2017, then extubated on 01/26/2017 and transitioned to BiPAP. Physical therapy evaluated the patient the recommended skilled nursing facility versus LTAC. chest xray 12/31-.Increasing opacities in right greater than left lung bases, likely atelectasis and/or pneumonia. Probable small right effusion.follow up chest xray today.Increasing opacities in right greater than left lung bases, likely atelectasis and/or pneumonia. Probable small right effusion.with clinical improvement.  Patient currently afebrile.  Normal white count.  Patient with a urine output of 1.45 L over the past 24 hours.  Patient is -12.352 L during this hospitalization.  Patient status post IV Lasix.  Status post course of IV antibiotics.  Supportive care.   Hypokalemia  Replete.  Magnesium has been repleted and currently at 2.0 from 1.2.    Septic shock secondary to community-acquired pneumonia: Patient on admission had to be placed on pressors due to septic shock and was  pancultured.  Patient was subsequently placed empirically on IV antibiotics which have been narrowed down.  Now off pressors, sputum cultures with moderate yeast. Blood cultures 1 out of 2 showed contaminant. Patient status  post completion of a course of antibiotics.  No further antibiotics needed at this time.  Acute kidney injury: Likely due to prerenal etiology.  Resolved with hydration.  Diabetic ketoacidosis with coma associated with type 2 diabetes mellitus ([place]) On admission his blood sugar was 800 with a gap and a pH of 7.1. He was started on glucose stabilizer, IV fluids with resolution of DKA.  Patient subsequently transitioned to long-acting insulin and sliding scale insulin.  Due to persistently low blood sugar levels long-acting Lantus has been discontinued.  Hemoglobin A1c was greater than 15.5 on 01/30/2017.  CBGs are ranging from 104-272.  Continue sliding scale insulin.    Dysphagia: Speech evaluated the patient last time on 01/27/2017 and recommended n.p.o. ice chips as needed. Formal swallowing evaluation showed moderate to severe dysphagia, they recommended to keep the patient n.p.o.patienthad MBS 02/02/2017.With severe aspiration.Patient requested to give him food to eat knowing consequences that he could get into recurrent pneumonia respiratory failure and needing intubation. This was discussed in detail with the patient. And he wanted to remain full code. Patient has been reassessed by speech therapy who feel patient is making progress with improved secretion management.  Respiratory muscle strength training to maximize respiratory/swallow rehabilitation was demonstrated to the patient by speech therapy.  Diet has been advanced to full liquid diet per speech therapy recommendations which patient states he is tolerating.  Patient for repeat modified barium swallow per speech therapy today.  Follow.  Severe protein calorie malnutrition with albumin of 1.6 Patient with significant aspiration concerns due to dysphagia noted by speech therapy.  Tolerating full liquid diet.  Patient for modified barium swallow again today per speech therapy.  Follow.  DTIpressure injury to the sacral  area/stage II/III sacral decubitus  followed by PT for hydrotherapy and packing and debridement with Santyl. Patient has been seen in consultation by general surgery who recommend to continue ongoing hydrotherapy.  Gerhardt's butt cream has been ordered.  Also noted that there were concerns for possible fungal rash and as such patient was started on Diflucan.  Continue IV Diflucan.  PT hydrotherapy has been discontinued and patient now with nursing dressing changes.  Patient will likely need to follow-up on the wound care center on discharge.  Appreciate general surgical input and recommendations.   Probable depression Patient has been started on Celexa.  Follow.  Will need outpatient follow-up.  Orthostasis Patient's father patient was noted to have complaints of dizziness going from supine to sitting position with physical therapy yesterday and per father patient was noted to have a drop in his systolic blood pressure.  Reviewed PTs note and it seems as if patient may have been somewhat orthostatic.  We will give a 1 L bolus of normal saline.  Placed on gentle hydration for the next 24-48 hours.  Repeat orthostasis in the morning.      DVT prophylaxis: Lovenox Code Status: Full Family Communication: Updated patient and father at bedside. Disposition Plan: Awaiting skilled nursing facility.   Consultants:   General surgery: Dr.Toth III 02/07/2017  Wound care 01/25/2017  Procedures:   CT head without contrast 01/23/2017  CT abdomen and pelvis 01/19/2017  Chest x-ray 02/05/2017, 01/30/2017  MBS 02/10/2017  Antimicrobials:   IV Unasyn 01/31/2017>>>> 02/07/2017 Zithromax 12/19 >> 12/22 Rocephin 12/19 >>  12/19 Zosyn 12/19 >>  Vancomycin 12/20 >> 12/22  Lines/tubes: ETT 12/19 >> 12/27 Femoral CVL 12/19 >> 12/24   Events: 12/19 Admit 12/20 ARDS protocol, start nimbex 12/22 Off nimbex 12/23 Off pressors, sedation  Urine 12/19 >> negative Blood 12/19 >> coag neg Staph Sputum  12/21 >> Candida Glabrata   Subjective: Patient laying in bed.  Patient states following is improving.  No chest pain.  No shortness of breath.  Patient denies any shortness of breath no chest pain.  Patient tolerating current full liquid diet.  Per father patient with some dizziness from supine to sitting position with physical therapy yesterday.   Objective: Vitals:   02/09/17 0514 02/09/17 1500 02/09/17 2030 02/10/17 0437  BP: 110/60 105/60 109/65 123/67  Pulse: 88 90 85 77  Resp: _0 Temp: 98.8 F (37.1 C) 98.7 F (37.1 C) 98.2 F (36.8 C) 98.5 F (36.9 C)  TempSrc: Oral Oral Oral Oral  SpO2: 96% 96% 98% 96%  Weight:      Height:        Intake/Output Summary (Last 24 hours) at 02/10/2017 1011 Last data filed at 02/10/2017 0600 Gross per 24 hour  Intake 970 ml  Output 2050 ml  Net -1080 ml   Filed Weights   02/05/17 0500 02/06/17 0510 02/07/17 0700  Weight: 78.5 kg (173 lb 1 oz) 76.7 kg (169 lb 1.5 oz) 75.5 kg (166 lb 7.2 oz)    Examination:  General exam: Appears calm and comfortable  Respiratory system: Clear to auscultation.  No wheezes, no crackles, no rhonchi.  Respiratory effort normal. Cardiovascular system: The rate and rhythm no murmurs rubs or gallops.  No JVD.  No pedal edema.  Gastrointestinal system: Abdomen is tender, nondistended, soft, positive bowel sounds.    Central nervous system: Alert and oriented. No focal neurological deficits. Extremities: Symmetric 5 x 5 power. Skin: Stage II decubitus ulcer sacrum with red erythematous almost yeastlike rash over buttocks and thighs of both legs.  Genitourinary: Rectal tube in place. Psychiatry: Judgement and insight appear fair. Mood & affect flat.     Data Reviewed: I have personally reviewed following labs and imaging studies  CBC: Recent Labs  Lab 02/08/17 0842 02/09/17 0406  WBC 9.0 8.5  NEUTROABS 6.4 5.2  HGB 8.8* 8.2*  HCT 27.0* 25.2*  MCV 84.6 84.3  PLT 303 093   Basic  Metabolic Panel: Recent Labs  Lab 02/05/17 0950 02/08/17 0842 02/09/17 0406  NA 136 137 137  K 3.4* 3.6 3.2*  CL 106 106 108  CO2 _1 GLUCOSE 207* 191* 103*  BUN _2 CREATININE 0.92 1.00 0.83  CALCIUM 6.5* 7.2* 7.4*  MG  --  1.2* 2.0   GFR: Estimated Creatinine Clearance: 102.9 mL/min (by C-G formula based on SCr of 0.83 mg/dL). Liver Function Tests: Recent Labs  Lab 02/08/17 0842  AST 15  ALT 11*  ALKPHOS 154*  BILITOT 0.4  PROT 7.1  ALBUMIN 1.6*   No results for input(s): LIPASE, AMYLASE in the last 168 hours. No results for input(s): AMMONIA in the last 168 hours. Coagulation Profile: No results for input(s): INR, PROTIME in the last 168 hours. Cardiac Enzymes: No results for input(s): CKTOTAL, CKMB, CKMBINDEX, TROPONINI in the last 168 hours. BNP (last 3 results) No results for input(s): PROBNP in the last 8760 hours. HbA1C: No results for input(s): HGBA1C in the last 72 hours. CBG: Recent Labs  Lab 02/09/17 1707 02/09/17 2026  02/09/17 2352 02/10/17 0431 02/10/17 0809  GLUCAP 272* 236* 167* 105* 244*   Lipid Profile: No results for input(s): CHOL, HDL, LDLCALC, TRIG, CHOLHDL, LDLDIRECT in the last 72 hours. Thyroid Function Tests: No results for input(s): TSH, T4TOTAL, FREET4, T3FREE, THYROIDAB in the last 72 hours. Anemia Panel: No results for input(s): VITAMINB12, FOLATE, FERRITIN, TIBC, IRON, RETICCTPCT in the last 72 hours. Sepsis Labs: No results for input(s): PROCALCITON, LATICACIDVEN in the last 168 hours.  No results found for this or any previous visit (from the past 240 hour(s)).       Radiology Studies: No results found.      Scheduled Meds: . chlorhexidine gluconate (MEDLINE KIT)  15 mL Mouth Rinse BID  . cholestyramine light  4 g Oral BID  . citalopram  20 mg Oral Daily  . collagenase   Topical Daily  . enoxaparin (LOVENOX) injection  40 mg Subcutaneous Daily  . feeding supplement  1 Container Oral TID BM  .  feeding supplement (PRO-STAT SUGAR FREE 64)  30 mL Oral BID  . Gerhardt's butt cream   Topical TID  . insulin aspart  0-20 Units Subcutaneous Q4H  . mouth rinse  15 mL Mouth Rinse QID   Continuous Infusions: . fluconazole (DIFLUCAN) IV Stopped (02/09/17 2143)     LOS: 23 days    Time spent: 35 mins    Irine Seal, MD [facility] Pager [phone]  If 7PM-7AM, please contact night-coverage [email or web address] Password [email or web address] 02/10/2017, 10:11 AM

## 2017-02-10 NOTE — Progress Notes (Addendum)
  Speech Language Pathology Treatment: Dysphagia  Patient Details Name: [name] MRN: [id number] DOB: 06/21/1965 Today's Date: 02/10/2017 Time: [id number] SLP Time Calculation (min) (ACUTE ONLY): 17 min  Assessment / Plan / Recommendation Clinical Impression  Pt seen today to assess po tolerance, potential readiness for repeat MBS and/or dietary advancement.  Pt agreeable to sit partially upright reclined.  Observed/assisted him to consume milk and grits.  NO indications of airway compromise and pt's voice remained clear.  Slight delay in oral transiting suspected,  Pt's father arrived and reports pt would sometimes need help to get up to use bathroom at home recently when SLP verbalized concern about depth of wound.  Will proceed with MBS to assure pt ready/appropriate for diet advancement given severity of dysphagia/sensory deficits.   Informed pt's father to need to conduct RMST today.  MBS on schedule for 1130 am and father invited.      HPI [name] weakness, decreased appetite and respiratory difficulites.  Pt required intubation from 12/19-12/27.  PMH + for HONK, pancreatitis, sleep apnea,   Swallow evaluation ordered. Pt underwent MBS on Monday Dec 31st and palliative meeting conducted.  Pt desire is to get well and go home.  Repeat MBS indicated to allow instrumental evaluation prior to dietary advancement.  Pt last MBS showed aspiration of all consistencies during MBS and gross weakness without ability to clear penetrates/aspirate despite head turn with chin tuck.  Pt has been po x ice chips        SLP Plan  MBS(today!)       Recommendations  Diet recommendations: Thin liquid(full liquids) Liquids provided via: Straw Medication Administration: Whole meds with puree Supervision: Patient able to self feed Compensations: Slow rate;Small sips/bites;Multiple dry swallows after each bite/sip;Other (Comment)(cough, "hock" and expectorate) Postural Changes  and/or Swallow Maneuvers: Seated upright 90 degrees;Upright 30-60 min after meal(as much as able with coccyx wound)                Oral Care Recommendations: Oral care QID Follow up Recommendations: (tbd) SLP Visit Diagnosis: Dysphagia, pharyngeal phase (R13.13);Dysphagia, pharyngoesophageal phase (R13.14) Plan: MBS(today!)       GO                [name], [name] 02/10/2017, 9:54 AM  Donavan Burnetamara Yoceline Bazar, MS [facility] SLP [phone]

## 2017-02-10 NOTE — Progress Notes (Signed)
Modified Barium Swallow Progress Note  Patient Details  Name: [name] MRN: [id number] Date of Birth: 01/08/1966  Today's Date: 02/10/2017  Modified Barium Swallow completed.  Full report located under Chart Review in the Imaging Section.  Brief recommendations include the following:  Clinical Impression  Patient presents with moderate oropharyngeal dysphagia with marginally improvement compared to prior MBS.  His tongue base retraction/laryngeal elevation/closure remains impaired results in severe residuals and SILENT trace aspiration/penetration of thin, nectar.  Worsening residuals with pudding/cracker noted.  Chin tuck helpful to mitigate dysphagia,decrease residuals.  Pt minimal sensation to gross residuals during today's evaluation.  Clinically he appears much better than during instrumental evaluation and therefore close monitoring of vitals will be indicated. Chin tuck posture helpful to decrease vallecular accumulation with solids but worsens airway protection with liquids.  Head turn right or left - not helpful.  Cued cough/throat clear removed trace penetration/aspiration but not deeper mild aspirates.  Pt has been tolerating likely chronic aspiration at this time and for him to receive more substantial nutrition with known risks, dietary advancement indicated.  Dys3/thin recommended with strict precautions.  Using live video, educated pt and father to findings/recommendations.    Swallow Evaluation Recommendations       SLP Diet Recommendations: Dysphagia 3 (Mech soft) solids;Thin liquid   Liquid Administration via: Cup   Medication Administration: Whole meds with puree   Supervision: Patient able to self feed   Compensations: Minimize environmental distractions;Slow rate;Small sips/bites;Chin tuck(chin tuck with solids, NOT with liquids)   Postural Changes: Remain semi-upright after after feeds/meals (Comment);Seated upright at 90 degrees   Oral Care Recommendations:  Oral care QID       [name], MS [facility] SLP [phone]  [name], [name] 02/10/2017,12:38 PM

## 2017-02-10 NOTE — Progress Notes (Signed)
Pt's wife sent a picture of insurance card to CM's work cell. Information was shared with Admitting and CSW.

## 2017-02-10 NOTE — Progress Notes (Signed)
CSW following to assist with discharge planning. PT recommending SNF.   CSW spoke with financial counselor that was able to verify patient's [employer] insurance.   Patient [facility], patient notified and agreeable.  CS[facility] spoke with staff at [facility], staff reported that they were unable to verify patient's insurance and requested that CSW ask patient's wife to call insurance company to let them know.  CSW contacted patient's wife and provided update, patient's wife agreed to call insurance company but reported that she was not sure when she would be able to. CSW explained to patient's wife that SNF would need to verify patient's insurance prior to admitting patient, patient's wife verbalized understanding.   CSW will continue to follow and assist with discharge planning.   Celso SickleKimberly Grayland Daisey, [place] Clinical Social Worker [facility] Cell#: [phone]

## 2017-02-11 LAB — CBC WITH DIFFERENTIAL/PLATELET
Basophils Absolute: 0 10*3/uL (ref 0.0–0.1)
Basophils Relative: 0 %
EOS PCT: 8 %
Eosinophils Absolute: 0.8 10*3/uL — ABNORMAL HIGH (ref 0.0–0.7)
HEMATOCRIT: 24.8 % — AB (ref 39.0–52.0)
Hemoglobin: 8.2 g/dL — ABNORMAL LOW (ref 13.0–17.0)
LYMPHS PCT: 22 %
Lymphs Abs: 2.2 10*3/uL (ref 0.7–4.0)
MCH: 28.1 pg (ref 26.0–34.0)
MCHC: 33.1 g/dL (ref 30.0–36.0)
MCV: 84.9 fL (ref 78.0–100.0)
MONOS PCT: 10 %
Monocytes Absolute: 1 10*3/uL (ref 0.1–1.0)
NEUTROS PCT: 60 %
Neutro Abs: 5.8 10*3/uL (ref 1.7–7.7)
Platelets: 335 10*3/uL (ref 150–400)
RBC: 2.92 MIL/uL — AB (ref 4.22–5.81)
RDW: 14.3 % (ref 11.5–15.5)
WBC: 9.8 10*3/uL (ref 4.0–10.5)

## 2017-02-11 LAB — BASIC METABOLIC PANEL
ANION GAP: 5 (ref 5–15)
BUN: 12 mg/dL (ref 6–20)
CALCIUM: 7.4 mg/dL — AB (ref 8.9–10.3)
CO2: 21 mmol/L — ABNORMAL LOW (ref 22–32)
Chloride: 109 mmol/L (ref 101–111)
Creatinine, Ser: 0.73 mg/dL (ref 0.61–1.24)
GLUCOSE: 98 mg/dL (ref 65–99)
POTASSIUM: 3.4 mmol/L — AB (ref 3.5–5.1)
SODIUM: 135 mmol/L (ref 135–145)

## 2017-02-11 LAB — GLUCOSE, CAPILLARY
GLUCOSE-CAPILLARY: 212 mg/dL — AB (ref 65–99)
GLUCOSE-CAPILLARY: 215 mg/dL — AB (ref 65–99)
GLUCOSE-CAPILLARY: 227 mg/dL — AB (ref 65–99)
GLUCOSE-CAPILLARY: 233 mg/dL — AB (ref 65–99)
GLUCOSE-CAPILLARY: 96 mg/dL (ref 65–99)
Glucose-Capillary: 330 mg/dL — ABNORMAL HIGH (ref 65–99)

## 2017-02-11 MED ORDER — CHOLESTYRAMINE LIGHT 4 G PO PACK
4.0000 g | PACK | ORAL | Status: DC
  Administered 2017-02-11 – 2017-02-14 (×9): 4 g via ORAL
  Filled 2017-02-11 (×10): qty 1

## 2017-02-11 MED ORDER — POTASSIUM CHLORIDE CRYS ER 20 MEQ PO TBCR
40.0000 meq | EXTENDED_RELEASE_TABLET | Freq: Once | ORAL | Status: AC
  Administered 2017-02-11: 40 meq via ORAL
  Filled 2017-02-11: qty 2

## 2017-02-11 MED ORDER — RIVAROXABAN 20 MG PO TABS: 20.0000 mg | ORAL_TABLET | Freq: Every day | ORAL | Status: DC

## 2017-02-11 MED ORDER — INSULIN ASPART 100 UNIT/ML ~~LOC~~ SOLN
0.0000 [IU] | Freq: Every day | SUBCUTANEOUS | Status: DC
  Administered 2017-02-11: 2 [IU] via SUBCUTANEOUS
  Administered 2017-02-12: 3 [IU] via SUBCUTANEOUS

## 2017-02-11 MED ORDER — RIVAROXABAN 20 MG PO TABS: 20.0000 mg | ORAL_TABLET | Freq: Once | ORAL | Status: DC

## 2017-02-11 MED ORDER — INSULIN ASPART 100 UNIT/ML ~~LOC~~ SOLN
0.0000 [IU] | Freq: Three times a day (TID) | SUBCUTANEOUS | Status: DC
  Administered 2017-02-11: 11 [IU] via SUBCUTANEOUS
  Administered 2017-02-11 (×2): 5 [IU] via SUBCUTANEOUS
  Administered 2017-02-12: 15 [IU] via SUBCUTANEOUS
  Administered 2017-02-12: 8 [IU] via SUBCUTANEOUS
  Administered 2017-02-12: 3 [IU] via SUBCUTANEOUS
  Administered 2017-02-13: 8 [IU] via SUBCUTANEOUS
  Administered 2017-02-13: 3 [IU] via SUBCUTANEOUS
  Administered 2017-02-14 (×2): 15 [IU] via SUBCUTANEOUS

## 2017-02-11 MED ORDER — ENOXAPARIN SODIUM 40 MG/0.4ML ~~LOC~~ SOLN
40.0000 mg | SUBCUTANEOUS | Status: DC
  Administered 2017-02-11 – 2017-02-14 (×4): 40 mg via SUBCUTANEOUS
  Filled 2017-02-11 (×4): qty 0.4

## 2017-02-11 NOTE — Progress Notes (Signed)
PROGRESS NOTE    [name]  ZOX:[id number] DOB: 1965-06-29 DOA: 01/18/2017 PCP: [name], MD    Brief Narrative:  52 y.o.malewith a history of obesity, diabetes, hypertension, hyperlipidemia, obstructive sleep apnea, atrial fib/flutter with a history of prior cardioversion. He was brought in by EMS and was noted to be hyperglycemic, encephalopathic, hypotensive. found to be in DKA and sepsis due to pneumonia.Due to overall decline, patient was intubated for airway protection on 12/19. He was admitted to ICU. [facility] hospitalist assumed care 12/30.  02/02/2017-placed foley backas he was not able to void. 02/03/2017-severe aspiration risk for oropharyngeal dysphagia and pharyngeal esophageal dysphagia.Discussed reports with the patient today and last night. Last night he reported that he want to think about it and he will give me an answer today. He states that he wants to eat,knowing that his chances of recurrent aspiration and recurrent pneumonia and being on life support may be a vicious cycle. He stated that he understand the consequences and he will face at as it comes. However he wants to remain a full code and wants to start eating as soon as possible. He also reports that he has been this way for many weeks or months.He reported that he would not be interested in an NG tube or PEG tube at this time.  02/04/2017 patient's wife by the bedside reports that patient ate everything her breakfast without any trouble this morning. Patient denies any complaints chest pain shortness of breath nausea vomiting or diarrhea.  02/06/2017-speech notes reviewed.patient denies sob.cough earlier when I saw him.  02/07/2017 patient remains in bed he has a rectal tube and a Foley catheter in place.  He denied shortness of breath or chest pain.  Patient seen by wound care as well as PT and undergoing hydrotherapy.  General surgery consulted.    Assessment & Plan:   Active  Problems:   Atrial flutter ([place])   Diabetic ketoacidosis with coma associated with type 2 diabetes mellitus ([place])   Pneumonia   Malnutrition of moderate degree   Pressure injury of skin   Acute respiratory failure with hypoxia (HCC)   Severe sepsis with septic shock (HCC)   Type 2 diabetes mellitus with stage 3 chronic kidney disease (HCC)   Hypernatremia   Hypokalemia   Sacral decubitus ulcer, stage III (HCC)   ARDS (adult respiratory distress syndrome) (HCC)   Acute metabolic encephalopathy   AKI (acute kidney injury) ([place])   Adjustment disorder with depressed mood   Palliative care by specialist   Goals of care, counseling/discussion   Advance care planning  Acute respiratory failure with hypoxia due to pneumonia and And ARDS: Intubated on 01/18/2017, then extubated on 01/26/2017 and transitioned to BiPAP. Physical therapy evaluated the patient the recommended skilled nursing facility versus LTAC. chest xray 12/31-.Increasing opacities in right greater than left lung bases, likely atelectasis and/or pneumonia. Probable small right effusion.follow up chest xray today.Increasing opacities in right greater than left lung bases, likely atelectasis and/or pneumonia. Probable small right effusion.with clinical improvement.  Patient currently afebrile.  Normal white count.  Patient with a urine output of 1.45 L over the past 24 hours.  Patient is -12.352 L during this hospitalization.  Patient status post IV Lasix.  Status post course of IV antibiotics.  Supportive care.   Hypokalemia  Replete.  Magnesium has been repleted and currently at 2.0 from 1.2.    Septic shock secondary to community-acquired pneumonia: Patient on admission had to be placed on pressors due  to septic shock and was pancultured.  Patient was subsequently placed empirically on IV antibiotics which have been narrowed down.  Now off pressors, sputum cultures with moderate yeast. Blood cultures 1 out of 2 showed  contaminant. Patient status post completion of a course of antibiotics.  No further antibiotics needed at this time.  Acute kidney injury: Likely due to prerenal etiology.  Resolved with hydration.  Diabetic ketoacidosis with coma associated with type 2 diabetes mellitus ([place]) On admission his blood sugar was 800 with a gap and a pH of 7.1. He was started on glucose stabilizer, IV fluids with resolution of DKA.  Patient subsequently transitioned to long-acting insulin and sliding scale insulin.  Due to persistently low blood sugar levels long-acting Lantus has been discontinued.  Hemoglobin A1c was greater than 15.5 on 01/30/2017.  CBGs are ranging from 104-272.  Continue sliding scale insulin.    Dysphagia: Speech evaluated the patient last time on 01/27/2017 and recommended n.p.o. ice chips as needed. Formal swallowing evaluation showed moderate to severe dysphagia, they recommended to keep the patient n.p.o.patienthad MBS 02/02/2017.With severe aspiration.Patient requested to give him food to eat knowing consequences that he could get into recurrent pneumonia respiratory failure and needing intubation. This was discussed in detail with the patient. And he wanted to remain full code. Patient has been reassessed by speech therapy who feel patient is making progress with improved secretion management.  Respiratory muscle strength training to maximize respiratory/swallow rehabilitation was demonstrated to the patient by speech therapy.  Diet has been advanced to dysphagia 3 diet per speech therapy recommendations which patient states he is tolerating.  Patient s/p repeat modified barium swallow per speech therapy today.  Follow.  Severe protein calorie malnutrition with albumin of 1.6 Patient with significant aspiration concerns due to dysphagia noted by speech therapy.  Tolerating dysphagia 3 diet.  Patient status post modified barium swallow.  Follow.  Fu  DTIpressure injury to the  sacral area/stage II/III sacral decubitus  followed by PT for hydrotherapy and packing and debridement with Santyl. Patient has been seen in consultation by general surgery who recommend to continue ongoing hydrotherapy.  Gerhardt's butt cream has been ordered.  Also noted that there were concerns for possible fungal rash and as such patient was started on Diflucan.  Continue IV Diflucan.  PT hydrotherapy has been discontinued and patient now with nursing dressing changes.  Patient will likely need to follow-up on the wound care center on discharge.  Appreciate general surgical input and recommendations.   Probable depression Patient has been started on Celexa. Will need outpatient follow-up.  Orthostasis Patient's father stated that patient was noted to have complaints of dizziness going from supine to sitting position with physical therapy on 02/09/2017 and per father patient was noted to have a drop in his systolic blood pressure.  Reviewed PTs note and it seems as if patient may have been somewhat orthostatic.  Patient given a bolus of normal saline as well as placed on maintenance IV fluids.  Repeat orthostatics pending for today.      DVT prophylaxis: Lovenox Code Status: Full Family Communication: Updated patient and wife at bedside. Disposition Plan: Awaiting skilled nursing facility.   Consultants:   General surgery: Dr.Toth III 02/07/2017  Wound care 01/25/2017  Procedures:   CT head without contrast 01/23/2017  CT abdomen and pelvis 01/19/2017  Chest x-ray 02/05/2017, 01/30/2017  MBS 02/10/2017  Antimicrobials:   IV Unasyn 01/31/2017>>>> 02/07/2017 Zithromax 12/19 >> 12/22 Rocephin 12/19 >> 12/19 Zosyn  12/19 >>  Vancomycin 12/20 >> 12/22  Lines/tubes: ETT 12/19 >> 12/27 Femoral CVL 12/19 >> 12/24   Events: 12/19 Admit 12/20 ARDS protocol, start nimbex 12/22 Off nimbex 12/23 Off pressors, sedation  Urine 12/19 >> negative Blood 12/19 >> coag neg Staph Sputum  12/21 >> Candida Glabrata   Subjective: Patient laying in bed.  No shortness of breath.  No chest pain.  Tolerating dysphagia 3 diet.    Objective: Vitals:   02/10/17 0437 02/10/17 1500 02/10/17 2254 02/11/17 0446  BP: 123/67 113/61 136/66 124/61  Pulse: 77 87 80 78  Resp: _0 Temp: 98.5 F (36.9 C) 98.5 F (36.9 C) 97.9 F (36.6 C) 98.2 F (36.8 C)  TempSrc: Oral Oral Oral Oral  SpO2: 96% 96% 99% 97%  Weight:    78.1 kg (172 lb 3.2 oz)  Height:        Intake/Output Summary (Last 24 hours) at 02/11/2017 1130 Last data filed at 02/11/2017 0500 Gross per 24 hour  Intake 2772 ml  Output 1650 ml  Net 1122 ml   Filed Weights   02/06/17 0510 02/07/17 0700 02/11/17 0446  Weight: 76.7 kg (169 lb 1.5 oz) 75.5 kg (166 lb 7.2 oz) 78.1 kg (172 lb 3.2 oz)    Examination:  General exam: Appears calm and comfortable  Respiratory system: Clear to auscultation bilaterally.  No crackles, no rhonchi, no wheezing.  Normal respiratory effort. Cardiovascular system: Regular rate and rhythm no murmurs rubs or gallops.  No JVD.  No pedal edema.   Gastrointestinal system: Abdomen is soft, nontender, nondistended, positive bowel sounds.  Central nervous system: Alert and oriented. No focal neurological deficits. Extremities: Symmetric 5 x 5 power. Skin: Stage II decubitus ulcer sacrum with red erythematous almost yeastlike rash over buttocks and thighs of both legs.  Genitourinary: Rectal tube in place. Psychiatry: Judgement and insight appear fair. Mood & affect flat.     Data Reviewed: I have personally reviewed following labs and imaging studies  CBC: Recent Labs  Lab 02/08/17 0842 02/09/17 0406 02/11/17 0421  WBC 9.0 8.5 9.8  NEUTROABS 6.4 5.2 5.8  HGB 8.8* 8.2* 8.2*  HCT 27.0* 25.2* 24.8*  MCV 84.6 84.3 84.9  PLT 303 329 035   Basic Metabolic Panel: Recent Labs  Lab 02/05/17 0950 02/08/17 0842 02/09/17 0406 02/10/17 0858 02/11/17 0421  NA 136 137 137 135 135    K 3.4* 3.6 3.2* 4.2 3.4*  CL 106 106 108 107 109  CO2 _1 21*  GLUCOSE 207* 191* 103* 264* 98  BUN _2 CREATININE 0.92 1.00 0.83 0.82 0.73  CALCIUM 6.5* 7.2* 7.4* 7.7* 7.4*  MG  --  1.2* 2.0  --   --    GFR: Estimated Creatinine Clearance: 106.8 mL/min (by C-G formula based on SCr of 0.73 mg/dL). Liver Function Tests: Recent Labs  Lab 02/08/17 0842  AST 15  ALT 11*  ALKPHOS 154*  BILITOT 0.4  PROT 7.1  ALBUMIN 1.6*   No results for input(s): LIPASE, AMYLASE in the last 168 hours. No results for input(s): AMMONIA in the last 168 hours. Coagulation Profile: No results for input(s): INR, PROTIME in the last 168 hours. Cardiac Enzymes: No results for input(s): CKTOTAL, CKMB, CKMBINDEX, TROPONINI in the last 168 hours. BNP (last 3 results) No results for input(s): PROBNP in the last 8760 hours. HbA1C: No results for input(s): HGBA1C in the last 72 hours. CBG: Recent Labs  Lab 02/10/17 1652 02/10/17 2251 02/11/17 0052 02/11/17 0443 02/11/17 0759  GLUCAP 251* 239* 233* 96 215*   Lipid Profile: No results for input(s): CHOL, HDL, LDLCALC, TRIG, CHOLHDL, LDLDIRECT in the last 72 hours. Thyroid Function Tests: No results for input(s): TSH, T4TOTAL, FREET4, T3FREE, THYROIDAB in the last 72 hours. Anemia Panel: No results for input(s): VITAMINB12, FOLATE, FERRITIN, TIBC, IRON, RETICCTPCT in the last 72 hours. Sepsis Labs: No results for input(s): PROCALCITON, LATICACIDVEN in the last 168 hours.  No results found for this or any previous visit (from the past 240 hour(s)).       Radiology Studies: Dg Swallowing Func-speech Pathology  Result Date: 02/10/2017 Objective Swallowing Evaluation: Type of Study: MBS-Modified Barium Swallow Study  Patient Details Name: TYAIRE ODEM MRN: [id number] Date of Birth: 1965/10/13 Today's Date: 02/10/2017 Time: SLP Start Time (ACUTE ONLY): 1218 -SLP Stop Time (ACUTE ONLY): 1250 SLP Time Calculation (min) (ACUTE ONLY):  32 min Past Medical History: Past Medical History: Diagnosis Date . Atrial fib/flutter, transient 09/19/2011 . Elevated cholesterol with elevated triglycerides  . Hypertension  . Kidney stones  . Pancreatitis 1999 . Pneumonia ~ 2003 . Sleep apnea   nO OFFICALLY DIAGNOSIED, SNORES LOUDLY AND WIFE HAS WITNESSED APNEA WHEN HE IS SLEEPING . Type II diabetes mellitus ([place])  Past Surgical History: Past Surgical History: Procedure Laterality Date . CARDIOVERSION  09/30/2011  Procedure: CARDIOVERSION;  Surgeon: Josue Hector, MD;  Location: [facility] ENDOSCOPY;  Service: Cardiovascular;  Laterality: N/A; . EYE SURGERY   . PARS PLANA VITRECTOMY  12/08/2011  left . PARS PLANA VITRECTOMY  12/08/2011  Procedure: PARS PLANA VITRECTOMY WITH 25 GAUGE;  Surgeon: Hayden Pedro, MD;  Location: [place];  Service: Ophthalmology;  Laterality: Left; . TEE WITHOUT CARDIOVERSION  09/30/2011  Procedure: TRANSESOPHAGEAL ECHOCARDIOGRAM (TEE);  Surgeon: Josue Hector, MD;  Location: [facility] ENDOSCOPY;  Service: Cardiovascular;  Laterality: N/A;  kristine/ebp/beverly ( or scheduling)/ time rescheduled from 1300 to 1200 talked ( mary) [name] adm to [facility] with weakness, decreased appetite and respiratory difficulites.  Pt required intubation from 12/19-12/27.  PMH + for HONK, pancreatitis, sleep apnea,   Swallow evaluation ordered. Pt underwent MBS on Monday Dec 31st and palliative meeting conducted.  Pt desire is to get well and go home.  Repeat MBS indicated to allow instrumental evaluation prior to dietary advancement.  Pt last MBS showed aspiration of all consistencies during MBS and gross weakness without ability to clear penetrates/aspirate despite head turn with chin tuck.  Pt has been po x ice chips   Subjective: pt awake in chair, complains of pain on bottom Assessment / Plan / Recommendation CHL IP CLINICAL IMPRESSIONS 02/10/2017 Clinical Impression Patient presents with moderate oropharyngeal dysphagia with marginally improvement compared to  prior MBS.  His tongue base retraction/laryngeal elevation/closure remains impaired results in severe residuals and SILENT trace aspiration/penetration of thin, nectar.  Worsening residuals with pudding/cracker noted.  Chin tuck helpful to mitigate dysphagia,decrease residuals.  Pt minimal sensation to gross residuals during today's evaluation.  Clinically he appears much better than during instrumental evaluation and therefore close monitoring of vitals will be indicated. Chin tuck posture helpful to decrease vallecular accumulation with solids but worsens airway protection with liquids.  Head turn right or left - not helpful.  Cued cough/throat clear removed trace penetration/aspiration but not deeper mild aspirates.  Pt has been tolerating likely chronic aspiration at this time and for him to receive more substantial nutrition with known risks, dietary advancement indicated.  Dys3/thin recommended with strict precautions.  Using live video, educated pt and father to findings/recommendations.  SLP Visit Diagnosis Dysphagia, oropharyngeal phase (R13.12);Dysphagia, pharyngoesophageal phase (R13.14) Attention and concentration deficit following -- Frontal lobe and executive function deficit following -- Impact on safety and function Moderate aspiration risk;Severe aspiration risk   CHL IP TREATMENT RECOMMENDATION 02/02/2017 Treatment Recommendations Therapy as outlined in treatment plan below   Prognosis 02/10/2017 Prognosis for Safe Diet Advancement Fair Barriers to Reach Goals Cognitive deficits;Severity of deficits;Motivation Barriers/Prognosis Comment -- CHL IP DIET RECOMMENDATION 02/10/2017 SLP Diet Recommendations Dysphagia 3 (Mech soft) solids;Thin liquid Liquid Administration via Cup Medication Administration Whole meds with puree Compensations Minimize environmental distractions;Slow rate;Small sips/bites;Chin tuck Postural Changes Remain semi-upright after after feeds/meals (Comment);Seated upright at 90  degrees   CHL IP OTHER RECOMMENDATIONS 02/10/2017 Recommended Consults -- Oral Care Recommendations Oral care QID Other Recommendations --   CHL IP FOLLOW UP RECOMMENDATIONS 02/10/2017 Follow up Recommendations Skilled Nursing facility   [facility] IP FREQUENCY AND DURATION 02/10/2017 Speech Therapy Frequency (ACUTE ONLY) min 2x/week Treatment Duration 2 weeks      CHL IP ORAL PHASE 02/10/2017 Oral Phase Impaired Oral - Pudding Teaspoon -- Oral - Pudding Cup -- Oral - Honey Teaspoon NT Oral - Honey Cup -- Oral - Nectar Teaspoon NT Oral - Nectar Cup Reduced posterior propulsion;Weak lingual manipulation;Premature spillage Oral - Nectar Straw Reduced posterior propulsion;Weak lingual manipulation;Premature spillage Oral - Thin Teaspoon Weak lingual manipulation;Reduced posterior propulsion;Premature spillage Oral - Thin Cup Weak lingual manipulation;Reduced posterior propulsion;Premature spillage Oral - Thin Straw Weak lingual manipulation;Reduced posterior propulsion;Premature spillage Oral - Puree Weak lingual manipulation;Reduced posterior propulsion;Premature spillage Oral - Mech Soft Weak lingual manipulation;Impaired mastication;Reduced posterior propulsion;Premature spillage Oral - Regular -- Oral - Multi-Consistency -- Oral - Pill -- Oral Phase - Comment --  CHL IP PHARYNGEAL PHASE 02/10/2017 Pharyngeal Phase -- Pharyngeal- Pudding Teaspoon -- Pharyngeal -- Pharyngeal- Pudding Cup -- Pharyngeal -- Pharyngeal- Honey Teaspoon NT Pharyngeal -- Pharyngeal- Honey Cup -- Pharyngeal -- Pharyngeal- Nectar Teaspoon NT Pharyngeal -- Pharyngeal- Nectar Cup Reduced laryngeal elevation;Reduced anterior laryngeal mobility;Reduced epiglottic inversion;Reduced pharyngeal peristalsis;Reduced airway/laryngeal closure;Penetration/Aspiration during swallow Pharyngeal Material enters airway, remains ABOVE vocal cords and not ejected out Pharyngeal- Nectar Straw Reduced pharyngeal peristalsis;Reduced epiglottic inversion;Reduced anterior  laryngeal mobility;Reduced laryngeal elevation;Reduced airway/laryngeal closure;Penetration/Aspiration during swallow Pharyngeal Material enters airway, remains ABOVE vocal cords and not ejected out;Material enters airway, CONTACTS cords and not ejected out;Material enters airway, passes BELOW cords without attempt by patient to eject out (silent aspiration) Pharyngeal- Thin Teaspoon Reduced pharyngeal peristalsis;Reduced epiglottic inversion;Reduced anterior laryngeal mobility;Reduced laryngeal elevation;Reduced airway/laryngeal closure;Reduced tongue base retraction Pharyngeal Material enters airway, remains ABOVE vocal cords and not ejected out;Material enters airway, passes BELOW cords without attempt by patient to eject out (silent aspiration) Pharyngeal- Thin Cup Reduced pharyngeal peristalsis;Reduced epiglottic inversion;Reduced anterior laryngeal mobility;Reduced laryngeal elevation;Reduced airway/laryngeal closure;Reduced tongue base retraction;Penetration/Aspiration during swallow Pharyngeal Material enters airway, passes BELOW cords without attempt by patient to eject out (silent aspiration);Material enters airway, remains ABOVE vocal cords and not ejected out;Material enters airway, CONTACTS cords and not ejected out Pharyngeal- Thin Straw -- Pharyngeal -- Pharyngeal- Puree Reduced pharyngeal peristalsis;Reduced epiglottic inversion;Reduced anterior laryngeal mobility;Reduced laryngeal elevation;Reduced airway/laryngeal closure;Reduced tongue base retraction;Pharyngeal residue - valleculae;Pharyngeal residue - pyriform Pharyngeal -- Pharyngeal- Mechanical Soft Reduced pharyngeal peristalsis;Reduced epiglottic inversion;Reduced anterior laryngeal mobility;Reduced laryngeal elevation;Reduced tongue base retraction;Pharyngeal residue - valleculae;Pharyngeal residue - pyriform Pharyngeal -- Pharyngeal- Regular -- Pharyngeal -- Pharyngeal- Multi-consistency -- Pharyngeal -- Pharyngeal- Pill -- Pharyngeal --  Pharyngeal Comment --  CHL IP CERVICAL ESOPHAGEAL PHASE 02/10/2017 Cervical Esophageal Phase Impaired Pudding Teaspoon -- Pudding Cup -- Honey Teaspoon -- Honey Cup -- Nectar Teaspoon -- Nectar Cup -- Nectar Straw -- Thin Teaspoon -- Thin Cup -- Thin Straw -- Puree -- Mechanical Soft -- Regular -- Multi-consistency -- Pill -- Cervical Esophageal Comment decreased UES opening - resulting in residuals without pt awareness, appearance of decreased clearance throughout esophagus without pt sensation, esophagus appeared dilated - radiologist not present to confirm No flowsheet data found. Macario Golds 02/10/2017, 2:47 PM  Luanna Salk, MS [facility] SLP [phone]                  Scheduled Meds: . chlorhexidine gluconate (MEDLINE KIT)  15 mL Mouth Rinse BID  . cholestyramine light  4 g Oral BID  . citalopram  20 mg Oral Daily  . collagenase   Topical Daily  . feeding supplement  1 Container Oral TID BM  . feeding supplement (PRO-STAT SUGAR FREE 64)  30 mL Oral BID  . Gerhardt's butt cream   Topical TID  . insulin aspart  0-15 Units Subcutaneous TID WC  . insulin aspart  0-5 Units Subcutaneous QHS  . mouth rinse  15 mL Mouth Rinse QID  . [START ON 02/12/2017] rivaroxaban  20 mg Oral Q supper  . rivaroxaban  20 mg Oral Once   Continuous Infusions: . fluconazole (DIFLUCAN) IV 100 mg (02/10/17 1710)     LOS: 24 days    Time spent: 35 mins    Irine Seal, MD [facility] Pager [phone]  If 7PM-7AM, please contact night-coverage [email or web address] Password [email or web address] 02/11/2017, 11:30 AM

## 2017-02-11 NOTE — Progress Notes (Signed)
ANTICOAGULATION CONSULT NOTE - Initial Consult  Pharmacy Consult for rivaroxaban (Xarelto) Indication: Hx atrial flutter, s/p cardioversion  No Known Allergies  Patient Measurements: Height: _0  (175.3 cm) Weight: 172 lb 3.2 oz (78.1 kg) IBW/kg (Calculated) : 70.7   Vital Signs: Temp: 98.2 F (36.8 C) (01/12 0446) Temp Source: Oral (01/12 0446) BP: 124/61 (01/12 0446) Pulse Rate: 78 (01/12 0446)  Labs: Recent Labs    02/09/17 0406 02/10/17 0858 02/11/17 0421  HGB 8.2*  --   --   HCT 25.2*  --   --   PLT 329  --   --   CREATININE 0.83 0.82 0.73    Estimated Creatinine Clearance: 106.8 mL/min (by C-G formula based on SCr of 0.73 mg/dL).   Medical History: Past Medical History:  Diagnosis Date  . Atrial fib/flutter, transient 09/19/2011  . Elevated cholesterol with elevated triglycerides   . Hypertension   . Kidney stones   . Pancreatitis 1999  . Pneumonia ~ 2003  . Sleep apnea    nO OFFICALLY DIAGNOSIED, SNORES LOUDLY AND WIFE HAS WITNESSED APNEA WHEN HE IS SLEEPING  . Type II diabetes mellitus (HCC)     Medications:  Scheduled:  . chlorhexidine gluconate (MEDLINE KIT)  15 mL Mouth Rinse BID  . cholestyramine light  4 g Oral BID  . citalopram  20 mg Oral Daily  . collagenase   Topical Daily  . feeding supplement  1 Container Oral TID BM  . feeding supplement (PRO-STAT SUGAR FREE 64)  30 mL Oral BID  . Gerhardt's butt cream   Topical TID  . insulin aspart  0-15 Units Subcutaneous TID WC  . insulin aspart  0-5 Units Subcutaneous QHS  . mouth rinse  15 mL Mouth Rinse QID  . potassium chloride  40 mEq Oral Once  . [START ON 02/12/2017] rivaroxaban  20 mg Oral Q supper  . rivaroxaban  20 mg Oral Once    Assessment: 52 y/o M previously on Xarelto for hx AFlutter, was reportedly instructed to stop after completing post-DCCV therapy, admitted 01/18/17 with sepsis, pneumonia.   Now resuming Xarelto per hospitalist. Concomitant fluconazole noted.  CrCl well  above 50 mL/min.   Goal of Therapy:  Full-dose anticoagulation with Xarelto   Plan:  1. DC prophylactic-dose Lovenox 2. Xarelto 20 mg PO today at 12 noon, then starting tomorrow daily with evening meal.  Clayburn Pert, PharmD, BCPS Pager: [phone] 02/11/2017  8:51 AM   ============================================================== Addendum: Cardiology office records indicate [place] was performed in 2013 and patient was instructed to DC Xarelto on 10/31/11.  Spoke with patient and family - they confirm cardiologist told him to stop Xarelto several weeks following DCCV but that this was several years ago and he hasn't taken Xarelto since then.  Updated Dr. Grandville Silos - will DC Xarelto order and resume prophylactic-dose Lovenox 40 mg SQ q24h.  Will update Med Hx records also.  Clayburn Pert, PharmD, BCPS Pager: [phone] 02/11/2017  12:37 PM

## 2017-02-12 LAB — BASIC METABOLIC PANEL
Anion gap: 8 (ref 5–15)
BUN: 14 mg/dL (ref 6–20)
CHLORIDE: 107 mmol/L (ref 101–111)
CO2: 17 mmol/L — ABNORMAL LOW (ref 22–32)
Calcium: 7.6 mg/dL — ABNORMAL LOW (ref 8.9–10.3)
Creatinine, Ser: 0.74 mg/dL (ref 0.61–1.24)
GFR calc non Af Amer: >60 mL/min (ref >60)
Glucose, Bld: 361 mg/dL — ABNORMAL HIGH (ref 65–99)
POTASSIUM: 4.2 mmol/L (ref 3.5–5.1)
SODIUM: 132 mmol/L — AB (ref 135–145)

## 2017-02-12 LAB — GLUCOSE, CAPILLARY
GLUCOSE-CAPILLARY: 367 mg/dL — AB (ref 65–99)
GLUCOSE-CAPILLARY: 271 mg/dL — AB (ref 65–99)
GLUCOSE-CAPILLARY: 225 mg/dL — AB (ref 65–99)
Glucose-Capillary: 153 mg/dL — ABNORMAL HIGH (ref 65–99)

## 2017-02-12 MED ORDER — SODIUM BICARBONATE 650 MG PO TABS
650.0000 mg | ORAL_TABLET | Freq: Two times a day (BID) | ORAL | Status: AC
  Administered 2017-02-12 – 2017-02-13 (×4): 650 mg via ORAL
  Filled 2017-02-12 (×4): qty 1

## 2017-02-12 MED ORDER — LOPERAMIDE HCL 2 MG PO CAPS
2.0000 mg | ORAL_CAPSULE | Freq: Once | ORAL | Status: AC
  Administered 2017-02-12: 2 mg via ORAL
  Filled 2017-02-12: qty 1

## 2017-02-12 MED ORDER — INSULIN GLARGINE 100 UNIT/ML ~~LOC~~ SOLN
8.0000 [IU] | Freq: Every day | SUBCUTANEOUS | Status: DC
  Administered 2017-02-12: 8 [IU] via SUBCUTANEOUS
  Filled 2017-02-12 (×3): qty 0.08

## 2017-02-12 NOTE — Progress Notes (Signed)
PROGRESS NOTE    ZAIDEN Horn  MWN:[id number] DOB: 1965/06/05 DOA: 01/18/2017 PCP: [name], MD    Brief Narrative:  52 y.o.malewith a history of obesity, diabetes, hypertension, hyperlipidemia, obstructive sleep apnea, atrial fib/flutter with a history of prior cardioversion. He was brought in by EMS and was noted to be hyperglycemic, encephalopathic, hypotensive. found to be in DKA and sepsis due to pneumonia.Due to overall decline, patient was intubated for airway protection on 12/19. He was admitted to ICU. [facility] hospitalist assumed care 12/30.  02/02/2017-placed foley backas he was not able to void. 02/03/2017-severe aspiration risk for oropharyngeal dysphagia and pharyngeal esophageal dysphagia.Discussed reports with the patient today and last night. Last night he reported that he want to think about it and he will give me an answer today. He states that he wants to eat,knowing that his chances of recurrent aspiration and recurrent pneumonia and being on life support may be a vicious cycle. He stated that he understand the consequences and he will face at as it comes. However he wants to remain a full code and wants to start eating as soon as possible. He also reports that he has been this way for many weeks or months.He reported that he would not be interested in an NG tube or PEG tube at this time.  02/04/2017 patient's wife by the bedside reports that patient ate everything her breakfast without any trouble this morning. Patient denies any complaints chest pain shortness of breath nausea vomiting or diarrhea.  02/06/2017-speech notes reviewed.patient denies sob.cough earlier when I saw him.  02/07/2017 patient remains in bed he has a rectal tube and a Foley catheter in place.  He denied shortness of breath or chest pain.  Patient seen by wound care as well as PT and undergoing hydrotherapy.  General surgery consulted.    Assessment & Plan:   Active  Problems:   Atrial flutter ([place])   Diabetic ketoacidosis with coma associated with type 2 diabetes mellitus ([place])   Pneumonia   Malnutrition of moderate degree   Pressure injury of skin   Acute respiratory failure with hypoxia (HCC)   Severe sepsis with septic shock (HCC)   Type 2 diabetes mellitus with stage 3 chronic kidney disease (HCC)   Hypernatremia   Hypokalemia   Sacral decubitus ulcer, stage III (HCC)   ARDS (adult respiratory distress syndrome) (HCC)   Acute metabolic encephalopathy   AKI (acute kidney injury) ([place])   Adjustment disorder with depressed mood   Palliative care by specialist   Goals of care, counseling/discussion   Advance care planning  Acute respiratory failure with hypoxia due to pneumonia and And ARDS: Intubated on 01/18/2017, then extubated on 01/26/2017 and transitioned to BiPAP. Physical therapy evaluated the patient the recommended skilled nursing facility versus LTAC. chest xray 12/31-.Increasing opacities in right greater than left lung bases, likely atelectasis and/or pneumonia. Probable small right effusion.follow up chest xray today.Increasing opacities in right greater than left lung bases, likely atelectasis and/or pneumonia. Probable small right effusion.with clinical improvement.  Patient currently afebrile.  Normal white count.  Patient with a urine output of 2.7 L over the past 24 hours.  Patient is -13.967 L during this hospitalization.  Patient status post IV Lasix.  Status post course of IV antibiotics.  Supportive care.   Hypokalemia  Repleted.  Magnesium has been repleted and currently at 2.0 from 1.2.    Septic shock secondary to community-acquired pneumonia: Patient on admission had to be placed on pressors due  to septic shock and was pancultured.  Patient was subsequently placed empirically on IV antibiotics which have been narrowed down.  Now off pressors, sputum cultures with moderate yeast. Blood cultures 1 out of 2 showed  contaminant. Patient status post completion of a course of antibiotics.  No further antibiotics needed at this time.  Acute kidney injury: Likely due to prerenal etiology.  Resolved with hydration.  Diabetic ketoacidosis with coma associated with type 2 diabetes mellitus ([place]) On admission his blood sugar was 800 with a gap and a pH of 7.1. He was started on glucose stabilizer, IV fluids with resolution of DKA.  Patient subsequently transitioned to long-acting insulin and sliding scale insulin.  Due to persistently low blood sugar levels long-acting Lantus has been discontinued.  Hemoglobin A1c was greater than 15.5 on 01/30/2017.  CBGs are ranging from 227-367.  Start Lantus 8 units daily and titrate as needed.  Continue sliding scale insulin.    Dysphagia: Speech evaluated the patient last time on 01/27/2017 and recommended n.p.o. ice chips as needed. Formal swallowing evaluation showed moderate to severe dysphagia, they recommended to keep the patient n.p.o.patienthad MBS 02/02/2017.With severe aspiration.Patient requested to give him food to eat knowing consequences that he could get into recurrent pneumonia respiratory failure and needing intubation. This was discussed in detail with the patient. And he wanted to remain full code. Patient has been reassessed by speech therapy who feel patient is making progress with improved secretion management.  Respiratory muscle strength training to maximize respiratory/swallow rehabilitation was demonstrated to the patient by speech therapy.  Diet has been advanced to dysphagia 3 diet per speech therapy recommendations which patient states he is tolerating.  Patient s/p repeat modified barium swallow per speech therapy.  Likely need outpatient follow-up.  Follow.  Severe protein calorie malnutrition with albumin of 1.6 Patient with significant aspiration concerns due to dysphagia noted by speech therapy.  Tolerating dysphagia 3 diet.  Patient  status post modified barium swallow.  Follow.   DTIpressure injury to the sacral area/stage II/III sacral decubitus  followed by PT for hydrotherapy and packing and debridement with Santyl. Patient has been seen in consultation by general surgery who recommend to continue ongoing hydrotherapy.  Gerhardt's butt cream has been ordered.  Also noted that there were concerns for possible fungal rash and as such patient was started on Diflucan.  Continue IV Diflucan.  PT hydrotherapy has been discontinued and patient now with nursing dressing changes.  Patient will likely need to follow-up on the wound care center on discharge.  Appreciate general surgical input and recommendations.   Probable depression Continue Celexa. Will need outpatient follow-up.  Orthostasis Patient's father stated that patient was noted to have complaints of dizziness going from supine to sitting position with physical therapy on 02/09/2017 and per father patient was noted to have a drop in his systolic blood pressure.  Reviewed PTs note and it seems as if patient may have been somewhat orthostatic.  Patient given a bolus of normal saline as well as placed on maintenance IV fluids.  Repeat orthostatics pending for today.      DVT prophylaxis: Lovenox Code Status: Full Family Communication: Updated patient and wife at bedside. Disposition Plan: Awaiting skilled nursing facility.   Consultants:   General surgery: Dr.Toth III 02/07/2017  Wound care 01/25/2017  Procedures:   CT head without contrast 01/23/2017  CT abdomen and pelvis 01/19/2017  Chest x-ray 02/05/2017, 01/30/2017  MBS 02/10/2017  Antimicrobials:   IV Unasyn 01/31/2017>>>> 02/07/2017  Zithromax 12/19 >> 12/22 Rocephin 12/19 >> 12/19 Zosyn 12/19 >>  Vancomycin 12/20 >> 12/22  Lines/tubes: ETT 12/19 >> 12/27 Femoral CVL 12/19 >> 12/24   Events: 12/19 Admit 12/20 ARDS protocol, start nimbex 12/22 Off nimbex 12/23 Off pressors,  sedation  Urine 12/19 >> negative Blood 12/19 >> coag neg Staph Sputum 12/21 >> Candida Glabrata   Subjective: Patient laying in bed.  Denies any shortness of breath no chest pain.  Tolerating current diet.   Objective: Vitals:   02/11/17 1442 02/11/17 2209 02/12/17 0541 02/12/17 0606  BP: 134/64 127/62  128/70  Pulse: (!) 103 81  83  Resp: 20 19    Temp: 98.7 F (37.1 C) 98.1 F (36.7 C)  98.1 F (36.7 C)  TempSrc: Oral Axillary  Axillary  SpO2: 100% 100%  98%  Weight:   75.6 kg (166 lb 11.2 oz)   Height:        Intake/Output Summary (Last 24 hours) at 02/12/2017 1213 Last data filed at 02/12/2017 0322 Gross per 24 hour  Intake 650 ml  Output 2700 ml  Net -2050 ml   Filed Weights   02/07/17 0700 02/11/17 0446 02/12/17 0541  Weight: 75.5 kg (166 lb 7.2 oz) 78.1 kg (172 lb 3.2 oz) 75.6 kg (166 lb 11.2 oz)    Examination:  General exam: NAD Respiratory system: Clear to auscultation bilaterally.  No crackles, no rhonchi, no wheezing.  Normal respiratory effort. Cardiovascular system: Regular rate and rhythm no murmurs rubs or gallops.  No JVD.  No pedal edema.   Gastrointestinal system: Abdomen is tender, nondistended, soft, positive bowel sounds. Central nervous system: Alert and oriented. No focal neurological deficits. Extremities: Symmetric 5 x 5 power. Skin: Stage II decubitus ulcer sacrum with red erythematous almost yeastlike rash over buttocks and thighs of both legs.  Genitourinary: Rectal tube in place. Psychiatry: Judgement and insight appear fair. Mood & affect flat.     Data Reviewed: I have personally reviewed following labs and imaging studies  CBC: Recent Labs  Lab 02/08/17 0842 02/09/17 0406 02/11/17 0421  WBC 9.0 8.5 9.8  NEUTROABS 6.4 5.2 5.8  HGB 8.8* 8.2* 8.2*  HCT 27.0* 25.2* 24.8*  MCV 84.6 84.3 84.9  PLT 303 329 026   Basic Metabolic Panel: Recent Labs  Lab 02/08/17 0842 02/09/17 0406 02/10/17 0858 02/11/17 0421  02/12/17 0531  NA 137 137 135 135 132*  K 3.6 3.2* 4.2 3.4* 4.2  CL 106 108 107 109 107  CO2 _0 21* 17*  GLUCOSE 191* 103* 264* 98 361*  BUN _1 CREATININE 1.00 0.83 0.82 0.73 0.74  CALCIUM 7.2* 7.4* 7.7* 7.4* 7.6*  MG 1.2* 2.0  --   --   --    GFR: Estimated Creatinine Clearance: 106.8 mL/min (by C-G formula based on SCr of 0.74 mg/dL). Liver Function Tests: Recent Labs  Lab 02/08/17 0842  AST 15  ALT 11*  ALKPHOS 154*  BILITOT 0.4  PROT 7.1  ALBUMIN 1.6*   No results for input(s): LIPASE, AMYLASE in the last 168 hours. No results for input(s): AMMONIA in the last 168 hours. Coagulation Profile: No results for input(s): INR, PROTIME in the last 168 hours. Cardiac Enzymes: No results for input(s): CKTOTAL, CKMB, CKMBINDEX, TROPONINI in the last 168 hours. BNP (last 3 results) No results for input(s): PROBNP in the last 8760 hours. HbA1C: No results for input(s): HGBA1C in the last 72 hours. CBG: Recent Labs  Lab  02/11/17 0759 02/11/17 1200 02/11/17 1645 02/11/17 2206 02/12/17 0744  GLUCAP 215* 330* 212* 227* 367*   Lipid Profile: No results for input(s): CHOL, HDL, LDLCALC, TRIG, CHOLHDL, LDLDIRECT in the last 72 hours. Thyroid Function Tests: No results for input(s): TSH, T4TOTAL, FREET4, T3FREE, THYROIDAB in the last 72 hours. Anemia Panel: No results for input(s): VITAMINB12, FOLATE, FERRITIN, TIBC, IRON, RETICCTPCT in the last 72 hours. Sepsis Labs: No results for input(s): PROCALCITON, LATICACIDVEN in the last 168 hours.  No results found for this or any previous visit (from the past 240 hour(s)).       Radiology Studies: Dg Swallowing Func-speech Pathology  Result Date: 02/10/2017 Objective Swallowing Evaluation: Type of Study: MBS-Modified Barium Swallow Study  Patient Details Name: JACOBIE STAMEY MRN: [id number] Date of Birth: 1965-05-22 Today's Date: 02/10/2017 Time: SLP Start Time (ACUTE ONLY): 1218 -SLP Stop Time (ACUTE ONLY):  1250 SLP Time Calculation (min) (ACUTE ONLY): 32 min Past Medical History: Past Medical History: Diagnosis Date . Atrial fib/flutter, transient 09/19/2011 . Elevated cholesterol with elevated triglycerides  . Hypertension  . Kidney stones  . Pancreatitis 1999 . Pneumonia ~ 2003 . Sleep apnea   nO OFFICALLY DIAGNOSIED, SNORES LOUDLY AND WIFE HAS WITNESSED APNEA WHEN HE IS SLEEPING . Type II diabetes mellitus ([place])  Past Surgical History: Past Surgical History: Procedure Laterality Date . CARDIOVERSION  09/30/2011  Procedure: CARDIOVERSION;  Surgeon: Josue Hector, MD;  Location[facility] ENDOSCOPY;  Service: Cardiovascular;  Laterality: N/A; . EYE SURGERY   . PARS PLANA VITRECTOMY  12/08/2011  left . PARS PLANA VITRECTOMY  12/08/2011  Procedure: PARS PLANA VITRECTOMY WITH 25 GAUGE;  Surgeon: Hayden Pedro, MD;  Location: [facility];  Service: Ophthalmology;  Laterality: Left; . TEE WITHOUT CARDIOVERSION  09/30/2011  Procedure: TRANSESOPHAGEAL ECHOCARDIOGRAM (TEE);  Surgeon: Josue Hector, MD;  Location: [facility] ENDOSCOPY;  Service: Cardiovascular;  Laterality: N/A;  kristine/ebp/beverly ( or scheduling)/ time rescheduled from 1300 to 1200 talked ( mary) [name] adm [facility] with weakness, decreased appetite and respiratory difficulites.  Pt required intubation from 12/19-12/27.  PMH + for HONK, pancreatitis, sleep apnea,   Swallow evaluation ordered. Pt underwent MBS on Monday Dec 31st and palliative meeting conducted.  Pt desire is to get well and go home.  Repeat MBS indicated to allow instrumental evaluation prior to dietary advancement.  Pt last MBS showed aspiration of all consistencies during MBS and gross weakness without ability to clear penetrates/aspirate despite head turn with chin tuck.  Pt has been po x ice chips   Subjective: pt awake in chair, complains of pain on bottom Assessment / Plan / Recommendation CHL IP CLINICAL IMPRESSIONS 02/10/2017 Clinical Impression Patient presents with moderate oropharyngeal  dysphagia with marginally improvement compared to prior MBS.  His tongue base retraction/laryngeal elevation/closure remains impaired results in severe residuals and SILENT trace aspiration/penetration of thin, nectar.  Worsening residuals with pudding/cracker noted.  Chin tuck helpful to mitigate dysphagia,decrease residuals.  Pt minimal sensation to gross residuals during today's evaluation.  Clinically he appears much better than during instrumental evaluation and therefore close monitoring of vitals will be indicated. Chin tuck posture helpful to decrease vallecular accumulation with solids but worsens airway protection with liquids.  Head turn right or left - not helpful.  Cued cough/throat clear removed trace penetration/aspiration but not deeper mild aspirates.  Pt has been tolerating likely chronic aspiration at this time and for him to receive more substantial nutrition with known risks, dietary advancement indicated.  Dys3/thin recommended with strict precautions.  Using live video, educated pt and father to findings/recommendations.  SLP Visit Diagnosis Dysphagia, oropharyngeal phase (R13.12);Dysphagia, pharyngoesophageal phase (R13.14) Attention and concentration deficit following -- Frontal lobe and executive function deficit following -- Impact on safety and function Moderate aspiration risk;Severe aspiration risk   CHL IP TREATMENT RECOMMENDATION 02/02/2017 Treatment Recommendations Therapy as outlined in treatment plan below   Prognosis 02/10/2017 Prognosis for Safe Diet Advancement Fair Barriers to Reach Goals Cognitive deficits;Severity of deficits;Motivation Barriers/Prognosis Comment -- CHL IP DIET RECOMMENDATION 02/10/2017 SLP Diet Recommendations Dysphagia 3 (Mech soft) solids;Thin liquid Liquid Administration via Cup Medication Administration Whole meds with puree Compensations Minimize environmental distractions;Slow rate;Small sips/bites;Chin tuck Postural Changes Remain semi-upright after after  feeds/meals (Comment);Seated upright at 90 degrees   CHL IP OTHER RECOMMENDATIONS 02/10/2017 Recommended Consults -- Oral Care Recommendations Oral care QID Other Recommendations --   CHL IP FOLLOW UP RECOMMENDATIONS 02/10/2017 Follow up Recommendations Skilled Nursing facility   [facility] IP FREQUENCY AND DURATION 02/10/2017 Speech Therapy Frequency (ACUTE ONLY) min 2x/week Treatment Duration 2 weeks      CHL IP ORAL PHASE 02/10/2017 Oral Phase Impaired Oral - Pudding Teaspoon -- Oral - Pudding Cup -- Oral - Honey Teaspoon NT Oral - Honey Cup -- Oral - Nectar Teaspoon NT Oral - Nectar Cup Reduced posterior propulsion;Weak lingual manipulation;Premature spillage Oral - Nectar Straw Reduced posterior propulsion;Weak lingual manipulation;Premature spillage Oral - Thin Teaspoon Weak lingual manipulation;Reduced posterior propulsion;Premature spillage Oral - Thin Cup Weak lingual manipulation;Reduced posterior propulsion;Premature spillage Oral - Thin Straw Weak lingual manipulation;Reduced posterior propulsion;Premature spillage Oral - Puree Weak lingual manipulation;Reduced posterior propulsion;Premature spillage Oral - Mech Soft Weak lingual manipulation;Impaired mastication;Reduced posterior propulsion;Premature spillage Oral - Regular -- Oral - Multi-Consistency -- Oral - Pill -- Oral Phase - Comment --  CHL IP PHARYNGEAL PHASE 02/10/2017 Pharyngeal Phase -- Pharyngeal- Pudding Teaspoon -- Pharyngeal -- Pharyngeal- Pudding Cup -- Pharyngeal -- Pharyngeal- Honey Teaspoon NT Pharyngeal -- Pharyngeal- Honey Cup -- Pharyngeal -- Pharyngeal- Nectar Teaspoon NT Pharyngeal -- Pharyngeal- Nectar Cup Reduced laryngeal elevation;Reduced anterior laryngeal mobility;Reduced epiglottic inversion;Reduced pharyngeal peristalsis;Reduced airway/laryngeal closure;Penetration/Aspiration during swallow Pharyngeal Material enters airway, remains ABOVE vocal cords and not ejected out Pharyngeal- Nectar Straw Reduced pharyngeal peristalsis;Reduced  epiglottic inversion;Reduced anterior laryngeal mobility;Reduced laryngeal elevation;Reduced airway/laryngeal closure;Penetration/Aspiration during swallow Pharyngeal Material enters airway, remains ABOVE vocal cords and not ejected out;Material enters airway, CONTACTS cords and not ejected out;Material enters airway, passes BELOW cords without attempt by patient to eject out (silent aspiration) Pharyngeal- Thin Teaspoon Reduced pharyngeal peristalsis;Reduced epiglottic inversion;Reduced anterior laryngeal mobility;Reduced laryngeal elevation;Reduced airway/laryngeal closure;Reduced tongue base retraction Pharyngeal Material enters airway, remains ABOVE vocal cords and not ejected out;Material enters airway, passes BELOW cords without attempt by patient to eject out (silent aspiration) Pharyngeal- Thin Cup Reduced pharyngeal peristalsis;Reduced epiglottic inversion;Reduced anterior laryngeal mobility;Reduced laryngeal elevation;Reduced airway/laryngeal closure;Reduced tongue base retraction;Penetration/Aspiration during swallow Pharyngeal Material enters airway, passes BELOW cords without attempt by patient to eject out (silent aspiration);Material enters airway, remains ABOVE vocal cords and not ejected out;Material enters airway, CONTACTS cords and not ejected out Pharyngeal- Thin Straw -- Pharyngeal -- Pharyngeal- Puree Reduced pharyngeal peristalsis;Reduced epiglottic inversion;Reduced anterior laryngeal mobility;Reduced laryngeal elevation;Reduced airway/laryngeal closure;Reduced tongue base retraction;Pharyngeal residue - valleculae;Pharyngeal residue - pyriform Pharyngeal -- Pharyngeal- Mechanical Soft Reduced pharyngeal peristalsis;Reduced epiglottic inversion;Reduced anterior laryngeal mobility;Reduced laryngeal elevation;Reduced tongue base retraction;Pharyngeal residue - valleculae;Pharyngeal residue - pyriform Pharyngeal -- Pharyngeal- Regular -- Pharyngeal -- Pharyngeal- Multi-consistency -- Pharyngeal  -- Pharyngeal- Pill -- Pharyngeal -- Pharyngeal Comment --  CHL IP CERVICAL ESOPHAGEAL PHASE 02/10/2017 Cervical Esophageal Phase Impaired Pudding Teaspoon -- Pudding Cup -- Honey Teaspoon -- Honey Cup -- Nectar Teaspoon -- Nectar Cup -- Nectar Straw -- Thin Teaspoon -- Thin Cup -- Thin Straw -- Puree -- Mechanical Soft -- Regular -- Multi-consistency -- Pill -- Cervical Esophageal Comment decreased UES opening - resulting in residuals without pt awareness, appearance of decreased clearance throughout esophagus without pt sensation, esophagus appeared dilated - radiologist not present to confirm No flowsheet data found. Macario Golds 02/10/2017, 2:47 PM  Luanna Salk, MS [facility] SLP [phone]                  Scheduled Meds: . chlorhexidine gluconate (MEDLINE KIT)  15 mL Mouth Rinse BID  . cholestyramine light  4 g Oral 3 times per day  . citalopram  20 mg Oral Daily  . collagenase   Topical Daily  . enoxaparin (LOVENOX) injection  40 mg Subcutaneous Q24H  . feeding supplement  1 Container Oral TID BM  . feeding supplement (PRO-STAT SUGAR FREE 64)  30 mL Oral BID  . Gerhardt's butt cream   Topical TID  . insulin aspart  0-15 Units Subcutaneous TID WC  . insulin aspart  0-5 Units Subcutaneous QHS  . insulin glargine  8 Units Subcutaneous Daily  . mouth rinse  15 mL Mouth Rinse QID  . sodium bicarbonate  650 mg Oral BID   Continuous Infusions: . fluconazole (DIFLUCAN) IV Stopped (02/11/17 1857)     LOS: 25 days    Time spent: 35 mins    Irine Seal, MD [facility] Pager [phone]  If 7PM-7AM, please contact night-coverage [email or web address] Password [facility] 02/12/2017, 12:13 PM

## 2017-02-13 LAB — CBC WITH DIFFERENTIAL/PLATELET
BASOS PCT: 1 %
Basophils Absolute: 0.1 10*3/uL (ref 0.0–0.1)
EOS ABS: 0.7 10*3/uL (ref 0.0–0.7)
Eosinophils Relative: 6 %
HCT: 26.8 % — ABNORMAL LOW (ref 39.0–52.0)
HEMOGLOBIN: 8.8 g/dL — AB (ref 13.0–17.0)
Lymphocytes Relative: 13 %
Lymphs Abs: 1.4 10*3/uL (ref 0.7–4.0)
MCH: 27.2 pg (ref 26.0–34.0)
MCHC: 32.8 g/dL (ref 30.0–36.0)
MCV: 82.7 fL (ref 78.0–100.0)
Monocytes Absolute: 1.2 10*3/uL — ABNORMAL HIGH (ref 0.1–1.0)
Monocytes Relative: 11 %
NEUTROS PCT: 69 %
Neutro Abs: 7.9 10*3/uL — ABNORMAL HIGH (ref 1.7–7.7)
Platelets: 349 10*3/uL (ref 150–400)
RBC: 3.24 MIL/uL — ABNORMAL LOW (ref 4.22–5.81)
RDW: 14.5 % (ref 11.5–15.5)
WBC: 11.2 10*3/uL — AB (ref 4.0–10.5)

## 2017-02-13 LAB — BASIC METABOLIC PANEL
ANION GAP: 7 (ref 5–15)
BUN: 18 mg/dL (ref 6–20)
CALCIUM: 8.1 mg/dL — AB (ref 8.9–10.3)
CHLORIDE: 105 mmol/L (ref 101–111)
CO2: 22 mmol/L (ref 22–32)
CREATININE: 0.78 mg/dL (ref 0.61–1.24)
GFR calc non Af Amer: >60 mL/min (ref >60)
Glucose, Bld: 355 mg/dL — ABNORMAL HIGH (ref 65–99)
Potassium: 3.5 mmol/L (ref 3.5–5.1)
SODIUM: 134 mmol/L — AB (ref 135–145)

## 2017-02-13 LAB — GLUCOSE, CAPILLARY
GLUCOSE-CAPILLARY: 419 mg/dL — AB (ref 65–99)
GLUCOSE-CAPILLARY: 298 mg/dL — AB (ref 65–99)
Glucose-Capillary: 164 mg/dL — ABNORMAL HIGH (ref 65–99)
Glucose-Capillary: 178 mg/dL — ABNORMAL HIGH (ref 65–99)

## 2017-02-13 MED ORDER — POTASSIUM CHLORIDE CRYS ER 20 MEQ PO TBCR
40.0000 meq | EXTENDED_RELEASE_TABLET | Freq: Once | ORAL | Status: AC
  Administered 2017-02-13: 40 meq via ORAL
  Filled 2017-02-13: qty 2

## 2017-02-13 MED ORDER — SODIUM CHLORIDE 0.9 % IV BOLUS (SEPSIS)
500.0000 mL | Freq: Once | INTRAVENOUS | Status: AC
  Administered 2017-02-13: 500 mL via INTRAVENOUS

## 2017-02-13 MED ORDER — INSULIN GLARGINE 100 UNIT/ML ~~LOC~~ SOLN
12.0000 [IU] | Freq: Every day | SUBCUTANEOUS | Status: DC
  Administered 2017-02-13: 12 [IU] via SUBCUTANEOUS
  Filled 2017-02-13 (×2): qty 0.12

## 2017-02-13 MED ORDER — INSULIN ASPART 100 UNIT/ML ~~LOC~~ SOLN
20.0000 [IU] | Freq: Once | SUBCUTANEOUS | Status: AC
  Administered 2017-02-13: 20 [IU] via SUBCUTANEOUS

## 2017-02-13 NOTE — Progress Notes (Signed)
PROGRESS NOTE    GABRIELLE MESTER  FUX:[id number] DOB: [birth year] DOA: 01/18/2017 PCP: [name], MD    Brief Narrative:  52 y.o.malewith a history of obesity, diabetes, hypertension, hyperlipidemia, obstructive sleep apnea, atrial fib/flutter with a history of prior cardioversion. He was brought in by EMS and was noted to be hyperglycemic, encephalopathic, hypotensive. found to be in DKA and sepsis due to pneumonia.Due to overall decline, patient was intubated for airway protection on 12/19. He was admitted to ICU. [facility] assumed care 12/30.  02/02/2017-placed foley backas he was not able to void. 02/03/2017-severe aspiration risk for oropharyngeal dysphagia and pharyngeal esophageal dysphagia.Discussed reports with the patient today and last night. Last night he reported that he want to think about it and he will give me an answer today. He states that he wants to eat,knowing that his chances of recurrent aspiration and recurrent pneumonia and being on life support may be a vicious cycle. He stated that he understand the consequences and he will face at as it comes. However he wants to remain a full code and wants to start eating as soon as possible. He also reports that he has been this way for many weeks or months.He reported that he would not be interested in an NG tube or PEG tube at this time.  02/04/2017 patient's wife by the bedside reports that patient ate everything her breakfast without any trouble this morning. Patient denies any complaints chest pain shortness of breath nausea vomiting or diarrhea.  02/06/2017-speech notes reviewed.patient denies sob.cough earlier when I saw him.  02/07/2017 patient remains in bed he has a rectal tube and a Foley catheter in place.  He denied shortness of breath or chest pain.  Patient seen by wound care as well as PT and undergoing hydrotherapy.  General surgery consulted.    Assessment & Plan:   Active  Problems:   Atrial flutter ([place])   Diabetic ketoacidosis with coma associated with type 2 diabetes mellitus ([place])   Pneumonia   Malnutrition of moderate degree   Pressure injury of skin   Acute respiratory failure with hypoxia (HCC)   Severe sepsis with septic shock (HCC)   Type 2 diabetes mellitus with stage 3 chronic kidney disease (HCC)   Hypernatremia   Hypokalemia   Sacral decubitus ulcer, stage III (HCC)   ARDS (adult respiratory distress syndrome) (HCC)   Acute metabolic encephalopathy   AKI (acute kidney injury) ([place])   Adjustment disorder with depressed mood   Palliative care by specialist   Goals of care, counseling/discussion   Advance care planning  Acute respiratory failure with hypoxia due to pneumonia and And ARDS: Intubated on 01/18/2017, then extubated on 01/26/2017 and transitioned to BiPAP. Physical therapy evaluated the patient the recommended skilled nursing facility versus LTAC. chest xray 12/31-.Increasing opacities in right greater than left lung bases, likely atelectasis and/or pneumonia. Probable small right effusion.follow up chest xray today.Increasing opacities in right greater than left lung bases, likely atelectasis and/or pneumonia. Probable small right effusion.with clinical improvement.  Patient currently afebrile.  Normal white count.  Patient with a urine output of 1.8 L over the past 24 hours.  Patient is -14.667 L during this hospitalization.  Patient status post IV Lasix.  Status post course of IV antibiotics.  Supportive care.   Hypokalemia  Repleted.  Magnesium has been repleted and currently at 2.0 from 1.2.    Septic shock secondary to community-acquired pneumonia: Patient on admission had to be placed on pressors due  to septic shock and was pancultured.  Patient was subsequently placed empirically on IV antibiotics which have been narrowed down.  Now off pressors, sputum cultures with moderate yeast. Blood cultures 1 out of 2 showed  contaminant. Patient status post completion of a course of antibiotics.  No further antibiotics needed at this time.  Acute kidney injury: Likely due to prerenal etiology.  Resolved with hydration.  Diabetic ketoacidosis with coma associated with type 2 diabetes mellitus ([place]) On admission his blood sugar was 800 with a gap and a pH of 7.1. He was started on glucose stabilizer, IV fluids with resolution of DKA.  Patient subsequently transitioned to long-acting insulin and sliding scale insulin.  Due to persistently low blood sugar levels long-acting Lantus has been discontinued.  Hemoglobin A1c was greater than 15.5 on 01/30/2017.  CBGs are ranging from 227-367.  Start Lantus 8 units daily and titrate as needed.  Continue sliding scale insulin.    Dysphagia: Speech evaluated the patient last time on 01/27/2017 and recommended n.p.o. ice chips as needed. Formal swallowing evaluation showed moderate to severe dysphagia, they recommended to keep the patient n.p.o.patienthad MBS 02/02/2017.With severe aspiration.Patient requested to give him food to eat knowing consequences that he could get into recurrent pneumonia respiratory failure and needing intubation. This was discussed in detail with the patient. And he wanted to remain full code. Patient has been reassessed by speech therapy who feel patient is making progress with improved secretion management.  Respiratory muscle strength training to maximize respiratory/swallow rehabilitation was demonstrated to the patient by speech therapy.  Diet has been advanced to dysphagia 3 diet per speech therapy recommendations which patient states he is tolerating.  Patient s/p repeat modified barium swallow per speech therapy.  Likely need outpatient follow-up.  Follow.  Severe protein calorie malnutrition with albumin of 1.6 Patient with significant aspiration concerns due to dysphagia noted by speech therapy.  Tolerating dysphagia 2 diet.  Patient  status post modified barium swallow.  Follow.   DTIpressure injury to the sacral area/stage II/III sacral decubitus  followed by PT for hydrotherapy and packing and debridement with Santyl. Patient has been seen in consultation by general surgery who recommend to continue ongoing hydrotherapy.  Gerhardt's butt cream has been ordered.  Also noted that there were concerns for possible fungal rash and as such patient was started on Diflucan.  Continue IV Diflucan.  PT hydrotherapy has been discontinued and patient now with nursing dressing changes.  Patient will likely need to follow-up on the wound care center on discharge.  Appreciate general surgical input and recommendations.   Probable depression Continue Celexa. Will need outpatient follow-up.  Orthostasis Patient's father stated that patient was noted to have complaints of dizziness going from supine to sitting position with physical therapy on 02/09/2017 and per father patient was noted to have a drop in his systolic blood pressure.  Reviewed PTs note and it seems as if patient may have been somewhat orthostatic.  Patient given a bolus of normal saline as well as placed on maintenance IV fluids.  Repeat orthostatics pending for today.      DVT prophylaxis: Lovenox Code Status: Full Family Communication: Updated patient.  No family  at bedside. Disposition Plan: Awaiting skilled nursing facility.   Consultants:   General surgery: Dr.Toth III 02/07/2017  Wound care 01/25/2017  Procedures:   CT head without contrast 01/23/2017  CT abdomen and pelvis 01/19/2017  Chest x-ray 02/05/2017, 01/30/2017  MBS 02/10/2017  Antimicrobials:   IV Unasyn  01/31/2017>>>> 02/07/2017 Zithromax 12/19 >> 12/22 Rocephin 12/19 >> 12/19 Zosyn 12/19 >>  Vancomycin 12/20 >> 12/22  Lines/tubes: ETT 12/19 >> 12/27 Femoral CVL 12/19 >> 12/24   Events: 12/19 Admit 12/20 ARDS protocol, start nimbex 12/22 Off nimbex 12/23 Off pressors,  sedation  Urine 12/19 >> negative Blood 12/19 >> coag neg Staph Sputum 12/21 >> Candida Glabrata   Subjective: Patient in bed.  No shortness of breath.  No chest pain.  Tolerating diet.  Objective: Vitals:   02/12/17 1542 02/12/17 1610 02/12/17 2043 02/13/17 0423  BP: 124/64 104/63 131/68 135/66  Pulse: 99 89 85 79  Resp: _0 Temp: 98.3 F (36.8 C)  97.9 F (36.6 C) 98.1 F (36.7 C)  TempSrc: Oral  Oral Oral  SpO2: 99%  99% 99%  Weight:      Height:        Intake/Output Summary (Last 24 hours) at 02/13/2017 1131 Last data filed at 02/13/2017 0500 Gross per 24 hour  Intake 290 ml  Output 1800 ml  Net -1510 ml   Filed Weights   02/07/17 0700 02/11/17 0446 02/12/17 0541  Weight: 75.5 kg (166 lb 7.2 oz) 78.1 kg (172 lb 3.2 oz) 75.6 kg (166 lb 11.2 oz)    Examination:   General exam: NAD Respiratory system: Clear to auscultation bilaterally.  No crackles, no rhonchi, no wheezing.  Normal respiratory effort. Cardiovascular system: Regular rate and rhythm no murmurs rubs or gallops.  No JVD.  No pedal edema.   Gastrointestinal system: Abdomen is nontender, nondistended, soft, positive bowel sounds.   Central nervous system: Alert and oriented. No focal neurological deficits. Extremities: Symmetric 5 x 5 power. Skin: Stage II decubitus ulcer sacrum with less red erythematous almost yeastlike rash over buttocks and thighs of both legs.  Some areas of eschar Genitourinary: Rectal tube in place. Psychiatry: Judgement and insight appear fair. Mood & affect flat.     Data Reviewed: I have personally reviewed following labs and imaging studies  CBC: Recent Labs  Lab 02/08/17 0842 02/09/17 0406 02/11/17 0421 02/13/17 0442  WBC 9.0 8.5 9.8 11.2*  NEUTROABS 6.4 5.2 5.8 7.9*  HGB 8.8* 8.2* 8.2* 8.8*  HCT 27.0* 25.2* 24.8* 26.8*  MCV 84.6 84.3 84.9 82.7  PLT 303 329 335 016   Basic Metabolic Panel: Recent Labs  Lab 02/08/17 0842 02/09/17 0406 02/10/17 0858  02/11/17 0421 02/12/17 0531 02/13/17 0442  NA 137 137 135 135 132* 134*  K 3.6 3.2* 4.2 3.4* 4.2 3.5  CL 106 108 107 109 107 105  CO2 _1 21* 17* 22  GLUCOSE 191* 103* 264* 98 361* 355*  BUN _2 CREATININE 1.00 0.83 0.82 0.73 0.74 0.78  CALCIUM 7.2* 7.4* 7.7* 7.4* 7.6* 8.1*  MG 1.2* 2.0  --   --   --   --    GFR: Estimated Creatinine Clearance: 106.8 mL/min (by C-G formula based on SCr of 0.78 mg/dL). Liver Function Tests: Recent Labs  Lab 02/08/17 0842  AST 15  ALT 11*  ALKPHOS 154*  BILITOT 0.4  PROT 7.1  ALBUMIN 1.6*   No results for input(s): LIPASE, AMYLASE in the last 168 hours. No results for input(s): AMMONIA in the last 168 hours. Coagulation Profile: No results for input(s): INR, PROTIME in the last 168 hours. Cardiac Enzymes: No results for input(s): CKTOTAL, CKMB, CKMBINDEX, TROPONINI in the last 168 hours. BNP (last 3 results) No results for input(s):  PROBNP in the last 8760 hours. HbA1C: No results for input(s): HGBA1C in the last 72 hours. CBG: Recent Labs  Lab 02/12/17 0744 02/12/17 1232 02/12/17 1722 02/12/17 2041 02/13/17 0736  GLUCAP 367* 271* 153* 225* 419*   Lipid Profile: No results for input(s): CHOL, HDL, LDLCALC, TRIG, CHOLHDL, LDLDIRECT in the last 72 hours. Thyroid Function Tests: No results for input(s): TSH, T4TOTAL, FREET4, T3FREE, THYROIDAB in the last 72 hours. Anemia Panel: No results for input(s): VITAMINB12, FOLATE, FERRITIN, TIBC, IRON, RETICCTPCT in the last 72 hours. Sepsis Labs: No results for input(s): PROCALCITON, LATICACIDVEN in the last 168 hours.  No results found for this or any previous visit (from the past 240 hour(s)).       Radiology Studies: No results found.      Scheduled Meds: . chlorhexidine gluconate (MEDLINE KIT)  15 mL Mouth Rinse BID  . cholestyramine light  4 g Oral 3 times per day  . citalopram  20 mg Oral Daily  . collagenase   Topical Daily  . enoxaparin  (LOVENOX) injection  40 mg Subcutaneous Q24H  . feeding supplement  1 Container Oral TID BM  . feeding supplement (PRO-STAT SUGAR FREE 64)  30 mL Oral BID  . Gerhardt's butt cream   Topical TID  . insulin aspart  0-15 Units Subcutaneous TID WC  . insulin aspart  0-5 Units Subcutaneous QHS  . insulin glargine  12 Units Subcutaneous Daily  . mouth rinse  15 mL Mouth Rinse QID  . sodium bicarbonate  650 mg Oral BID   Continuous Infusions: . fluconazole (DIFLUCAN) IV Stopped (02/12/17 1827)     LOS: 26 days    Time spent: 35 mins    Irine Seal, MD [facility] Pager [phone]  If 7PM-7AM, please contact night-coverage [email or web address] Password [facility] 02/13/2017, 11:31 AM

## 2017-02-13 NOTE — Progress Notes (Signed)
Physical Therapy Treatment Patient Details Name: Gregory MasonKenneth D Horn MRN: [id number] DOB: 08/27/1965 Today's Date: 02/13/2017    History of Present Illness 52 year old man with a history of obesity, diabetes, hypertension, hyperlipidemia, obstructive sleep apnea, atrial fib/flutter with a history of prior cardioversion.  He called his son on the date of admission because he was feeling weak and unwell, reportedly had not been eating or drinking several days prior to admission;  He was brought by EMS to ED and was noted to be hypoglycemic, encephalopathic, hypotensive.  Pt was intubated in the ED on 12/19, EXT ?12/23; pt with multiple pressure injuries on admission    PT Comments    Patient progressing this session with bed mobility, brief standing trial and more quickly participating with encouragement.  Continue to feel he is appropriate for SNF level rehab.  Encouraged once again to sit with HOB elevated and perform UE therex as issued per OT.  PT to follow.  Goals updated this session.    Follow Up Recommendations  SNF     Equipment Recommendations  Hospital bed;Wheelchair (measurements PT);Rolling walker with 5" wheels;3in1 (PT)    Recommendations for Other Services       Precautions / Restrictions Precautions Precautions: Fall Precaution Comments: flexiseal, multiple lines, coccyx wound  Monitor vitals    Mobility  Bed Mobility Overal bed mobility: Needs Assistance Bed Mobility: Sidelying to Sit;Sit to Sidelying   Sidelying to sit: Mod assist     Sit to sidelying: Mod assist General bed mobility comments: cues for using rail and UE's to assist self upright and increased time given; assist for trunk to upright, assist for legs onto bed to sidelying.  Pt pulled up in bed using railing in sidelying and bed in trendelenberg with max encouragement  Transfers Overall transfer level: Needs assistance Equipment used: 2 person hand held assist Transfers: Sit to/from Stand Sit to  Stand: +2 physical assistance;Max assist         General transfer comment: assist to stand briefly with 2 person assist, stood about 5 seconds prior to requesting to sit and returned to supine due to light headed.   Ambulation/Gait             General Gait Details: unable   Stairs            Wheelchair Mobility    Modified Rankin (Stroke Patients Only)       Balance Overall balance assessment: Needs assistance Sitting-balance support: Bilateral upper extremity supported;Feet supported Sitting balance-Leahy Scale: Fair Sitting balance - Comments: Pt briefly sat EOB with minguard to S     Standing balance-Leahy Scale: Zero Standing balance comment: +2 A for standing briefly                            Cognition Arousal/Alertness: Awake/alert Behavior During Therapy: Flat affect Overall Cognitive Status: No family/caregiver present to determine baseline cognitive functioning                                        Exercises      General Comments        Pertinent Vitals/Pain Faces Pain Scale: Hurts even more Pain Location: bottom Pain Descriptors / Indicators: Pressure Pain Intervention(s): Monitored during session;Repositioned;Other (comment)(bed on autofirm)    Home Living  Prior Function            PT Goals (current goals can now be found in the care plan section) Progress towards PT goals: Progressing toward goals    Frequency    Min 2X/week      PT Plan Current plan remains appropriate    Co-evaluation PT/OT/SLP Co-Evaluation/Treatment: Yes Reason for Co-Treatment: For patient/therapist safety PT goals addressed during session: Mobility/safety with mobility;Balance OT goals addressed during session: ADL's and self-care      AM-PAC PT "6 Clicks" Daily Activity  Outcome Measure  Difficulty turning over in bed (including adjusting bedclothes, sheets and blankets)?: A  Little Difficulty moving from lying on back to sitting on the side of the bed? : Unable Difficulty sitting down on and standing up from a chair with arms (e.g., wheelchair, bedside commode, etc,.)?: Unable Help needed moving to and from a bed to chair (including a wheelchair)?: Total Help needed walking in hospital room?: Total Help needed climbing 3-5 steps with a railing? : Total 6 Click Score: 8    End of Session   Activity Tolerance: Patient limited by fatigue(lightheadedness) Patient left: in bed;with call bell/phone within reach;with family/visitor present;with bed alarm set   PT Visit Diagnosis: Muscle weakness (generalized) (M62.81);Adult, failure to thrive (R62.7);Difficulty in walking, not elsewhere classified (R26.2)     Time: 1610-9604 PT Time Calculation (min) (ACUTE ONLY): 18 min  Charges:  $Therapeutic Activity: 8-22 mins                    G CodesSheran Lawless, [name] [phone] 02/13/2017    Elray Mcgregor 02/13/2017, 1:01 PM

## 2017-02-13 NOTE — Progress Notes (Signed)
Occupational Therapy Treatment Patient Details Name: [name] MRN: [id number] DOB: 12/11/1965 Today's Date: 02/13/2017    History of present illness 52 year old man with a history of obesity, diabetes, hypertension, hyperlipidemia, obstructive sleep apnea, atrial fib/flutter with a history of prior cardioversion.  He called his son on the date of admission because he was feeling weak and unwell, reportedly had not been eating or drinking several days prior to admission;  He was brought by EMS to ED and was noted to be hypoglycemic, encephalopathic, hypotensive.  Pt was intubated in the ED on 12/19, EXT ?12/23; pt with multiple pressure injuries on admission   OT comments  Needs SNF rehab to increase I with ADL activity. Pt with increased participation this day   Follow Up Recommendations  SNF    Equipment Recommendations  None recommended by OT    Recommendations for Other Services      Precautions / Restrictions Precautions Precautions: Fall Precaution Comments: flexiseal, multiple lines, coccyx wound  Monitor vitals       Mobility Bed Mobility Overal bed mobility: Needs Assistance Bed Mobility: Sidelying to Sit;Sit to Sidelying   Sidelying to sit: Mod assist     Sit to sidelying: Mod assist General bed mobility comments: cues for using rail and UE's to assist self upright and increased time given; assist for trunk to upright, assist for legs onto bed to sidelying.  Pt pulled up in bed using railing in sidelying and bed in trendelenberg with max encouragement  Transfers Overall transfer level: Needs assistance Equipment used: 2 person hand held assist Transfers: Sit to/from Stand Sit to Stand: +2 physical assistance;Max assist         General transfer comment: assist to stand briefly with 2 person assist, stood about 5 seconds prior to requesting to sit and returned to supine due to light headed.     Balance Overall balance assessment: Needs  assistance Sitting-balance support: Bilateral upper extremity supported;Feet supported Sitting balance-Leahy Scale: Fair Sitting balance - Comments: Pt briefly sat EOB with minguard to S     Standing balance-Leahy Scale: Zero Standing balance comment: +2 A for standing briefly                           ADL either performed or assessed with clinical judgement   ADL Overall ADL's : Needs assistance/impaired     Grooming: Minimal assistance;Sitting                                                 Cognition Arousal/Alertness: Awake/alert Behavior During Therapy: Flat affect Overall Cognitive Status: No family/caregiver present to determine baseline cognitive functioning                                                     Pertinent Vitals/ Pain       Faces Pain Scale: Hurts even more Pain Location: bottom Pain Descriptors / Indicators: Pressure Pain Intervention(s): Monitored during session;Repositioned;Other (comment)(bed on autofirm)     Prior Functioning/Environment              Frequency           Progress Toward Goals  OT Goals(current goals can now be found in the care plan section)  Progress towards OT goals: Progressing toward goals     Plan Discharge plan remains appropriate    Co-evaluation    PT/OT/SLP Co-Evaluation/Treatment: Yes Reason for Co-Treatment: For patient/therapist safety PT goals addressed during session: Mobility/safety with mobility;Balance OT goals addressed during session: ADL's and self-care      AM-PAC PT "6 Clicks" Daily Activity     Outcome Measure   Help from another person eating meals?: A Little Help from another person taking care of personal grooming?: A Little Help from another person toileting, which includes using toliet, bedpan, or urinal?: Total Help from another person bathing (including washing, rinsing, drying)?: A Lot Help from another person to put on and  taking off regular upper body clothing?: Total Help from another person to put on and taking off regular lower body clothing?: Total 6 Click Score: 11    End of Session Equipment Utilized During Treatment: Rolling walker  OT Visit Diagnosis: Unsteadiness on feet (R26.81);Other abnormalities of gait and mobility (R26.89);Muscle weakness (generalized) (M62.81)   Activity Tolerance Patient tolerated treatment well(feel pt is self limiting.)   Patient Left in bed;with bed alarm set   Nurse Communication Mobility status        Time: 1610-9604 OT Time Calculation (min): 18 min  Charges: OT General Charges $OT Visit: 1 Visit OT Treatments $Therapeutic Activity: 8-22 mins  [place], [place] [phone]   Alba Cory 02/13/2017, 1:03 PM

## 2017-02-13 NOTE — Progress Notes (Signed)
CC:  decubitus  Subjective: Skin around the decubitus looks better, he has been on Butt cream and Diflucan for this.  Still has a Flexiseal in place.  Still not moving and on his bottom most of the time.    Objective: Vital signs in last 24 hours: Temp:  [97.9 F (36.6 C)-98.3 F (36.8 C)] 98.1 F (36.7 C) (01/14 0423) Pulse Rate:  [79-99] 79 (01/14 0423) Resp:  [16-20] 16 (01/14 0423) BP: (104-135)/(63-68) 135/66 (01/14 0423) SpO2:  [99 %] 99 % (01/14 0423) Last BM Date: 02/12/17 360 PO  recorded 50 IV 1800 urine  recorded BM x 2 recorded Afebrile, VSS WBC 11.2- it has been normal H/H  Up to 8.8/26.8 Glucose 355 Intake/Output from previous day: 01/13 0701 - 01/14 0700 In: 410 [P.O.:360; IV Piggyback:50] Out: 1800 [Urine:1800] Intake/Output this shift: No intake/output data recorded.  General appearance: alert, cooperative and no distress Skin: Skin color, texture, turgor normal. No rashes or lesions or see pictures  Pt notes he has only stood up since last time I saw him.     02/07/17 exam   02/13/17 exam.  Erythema is much better, dry eschar with no drainage and fixed in place.  Lab Results:  Recent Labs    02/11/17 0421 02/13/17 0442  WBC 9.8 11.2*  HGB 8.2* 8.8*  HCT 24.8* 26.8*  PLT 335 349    BMET Recent Labs    02/12/17 0531 02/13/17 0442  NA 132* 134*  K 4.2 3.5  CL 107 105  CO2 17* 22  GLUCOSE 361* 355*  BUN 14 18  CREATININE 0.74 0.78  CALCIUM 7.6* 8.1*   PT/INR No results for input(s): LABPROT, INR in the last 72 hours.  Recent Labs  Lab 02/08/17 0842  AST 15  ALT 11*  ALKPHOS 154*  BILITOT 0.4  PROT 7.1  ALBUMIN 1.6*     Lipase     Component Value Date/Time   LIPASE 23 01/21/2017 0415     Medications: . chlorhexidine gluconate (MEDLINE KIT)  15 mL Mouth Rinse BID  . cholestyramine light  4 g Oral 3 times per day  . citalopram  20 mg Oral Daily  . collagenase   Topical Daily  . enoxaparin (LOVENOX) injection   40 mg Subcutaneous Q24H  . feeding supplement  1 Container Oral TID BM  . feeding supplement (PRO-STAT SUGAR FREE 64)  30 mL Oral BID  . Gerhardt's butt cream   Topical TID  . insulin aspart  0-15 Units Subcutaneous TID WC  . insulin aspart  0-5 Units Subcutaneous QHS  . insulin glargine  12 Units Subcutaneous Daily  . mouth rinse  15 mL Mouth Rinse QID  . sodium bicarbonate  650 mg Oral BID   . fluconazole (DIFLUCAN) IV Stopped (02/12/17 1827)    Assessment/Plan Stage II sacral decubitus. Rash over the buttocks and thighs.  Acute respiratory failure with hypoxia secondary to pneumonia and ARDS. -Improved. Septic shock secondary to community-acquired pneumonia -improved Acute kidney injury -resolved Diabetic ketoacidosis -glucose 800 on admission -resolved  Glucose still uncontrolled - 355 this AM Dysphasia Hypokalemia Severe protein calorie malnutrition Depression Orthostatic hypotension   FEN: dysphagia III diet ID: Unasyn 1/1-02/07/17  Diflucan 1/8 =>> day 7 DVT:  Lovenox Foley:  None Follow up:  TBD  Plan:  Dr. Dalbert Batman will see and review.  Dry eschar on my exam.  Fungal cellulitis improving.    LOS: 26 days    [name] 02/13/2017 [id number]

## 2017-02-14 ENCOUNTER — Encounter (HOSPITAL_BASED_OUTPATIENT_CLINIC_OR_DEPARTMENT_OTHER): Payer: MEDICAID

## 2017-02-14 DIAGNOSIS — E1122 Type 2 diabetes mellitus with diabetic chronic kidney disease: Secondary | ICD-10-CM

## 2017-02-14 DIAGNOSIS — N179 Acute kidney failure, unspecified: Secondary | ICD-10-CM

## 2017-02-14 DIAGNOSIS — E1111 Type 2 diabetes mellitus with ketoacidosis with coma: Secondary | ICD-10-CM

## 2017-02-14 DIAGNOSIS — F4321 Adjustment disorder with depressed mood: Secondary | ICD-10-CM

## 2017-02-14 DIAGNOSIS — L89153 Pressure ulcer of sacral region, stage 3: Secondary | ICD-10-CM

## 2017-02-14 DIAGNOSIS — N183 Chronic kidney disease, stage 3 (moderate): Secondary | ICD-10-CM

## 2017-02-14 DIAGNOSIS — E876 Hypokalemia: Secondary | ICD-10-CM

## 2017-02-14 DIAGNOSIS — E440 Moderate protein-calorie malnutrition: Secondary | ICD-10-CM

## 2017-02-14 LAB — BASIC METABOLIC PANEL
ANION GAP: 8 (ref 5–15)
BUN: 18 mg/dL (ref 6–20)
CALCIUM: 7.9 mg/dL — AB (ref 8.9–10.3)
CO2: 22 mmol/L (ref 22–32)
Chloride: 104 mmol/L (ref 101–111)
Creatinine, Ser: 0.68 mg/dL (ref 0.61–1.24)
GFR calc non Af Amer: >60 mL/min (ref >60)
Glucose, Bld: 390 mg/dL — ABNORMAL HIGH (ref 65–99)
Potassium: 3.8 mmol/L (ref 3.5–5.1)
Sodium: 134 mmol/L — ABNORMAL LOW (ref 135–145)

## 2017-02-14 LAB — GLUCOSE, CAPILLARY
GLUCOSE-CAPILLARY: 382 mg/dL — AB (ref 65–99)
GLUCOSE-CAPILLARY: 303 mg/dL — AB (ref 65–99)
GLUCOSE-CAPILLARY: 360 mg/dL — AB (ref 65–99)

## 2017-02-14 MED ORDER — LOPERAMIDE HCL 2 MG PO CAPS
2.0000 mg | ORAL_CAPSULE | Freq: Once | ORAL | Status: AC
  Administered 2017-02-14: 2 mg via ORAL
  Filled 2017-02-14: qty 1

## 2017-02-14 MED ORDER — CHOLESTYRAMINE LIGHT 4 G PO PACK: 4.0000 g | PACK | Freq: Three times a day (TID) | ORAL | Status: AC

## 2017-02-14 MED ORDER — INSULIN GLARGINE 100 UNIT/ML ~~LOC~~ SOLN: 20.0000 [IU] | Freq: Every day | SUBCUTANEOUS | 0 refills | Status: DC

## 2017-02-14 MED ORDER — GERHARDT'S BUTT CREAM: 1.0000 "application " | TOPICAL_CREAM | Freq: Three times a day (TID) | CUTANEOUS | Status: DC

## 2017-02-14 MED ORDER — COLLAGENASE 250 UNIT/GM EX OINT: TOPICAL_OINTMENT | Freq: Every day | CUTANEOUS | 0 refills | Status: DC

## 2017-02-14 MED ORDER — INSULIN ASPART 100 UNIT/ML ~~LOC~~ SOLN: 0.0000 [IU] | Freq: Every day | SUBCUTANEOUS | 11 refills | Status: AC

## 2017-02-14 MED ORDER — ENOXAPARIN SODIUM 40 MG/0.4ML ~~LOC~~ SOLN: 40.0000 mg | SUBCUTANEOUS | Status: DC

## 2017-02-14 MED ORDER — PRO-STAT SUGAR FREE PO LIQD: 30.0000 mL | Freq: Two times a day (BID) | ORAL | 0 refills | Status: AC

## 2017-02-14 MED ORDER — INSULIN ASPART 100 UNIT/ML ~~LOC~~ SOLN: 0.0000 [IU] | Freq: Three times a day (TID) | SUBCUTANEOUS | 11 refills | Status: AC

## 2017-02-14 MED ORDER — INSULIN GLARGINE 100 UNIT/ML ~~LOC~~ SOLN
20.0000 [IU] | Freq: Every day | SUBCUTANEOUS | Status: DC
Start: 2017-02-15 — End: 2017-02-14

## 2017-02-14 MED ORDER — INSULIN GLARGINE 100 UNIT/ML ~~LOC~~ SOLN
18.0000 [IU] | Freq: Every day | SUBCUTANEOUS | Status: DC
  Administered 2017-02-14: 18 [IU] via SUBCUTANEOUS
  Filled 2017-02-14: qty 0.18

## 2017-02-14 MED ORDER — CITALOPRAM HYDROBROMIDE 20 MG PO TABS: 20.0000 mg | ORAL_TABLET | Freq: Every day | ORAL | 0 refills | Status: AC

## 2017-02-14 NOTE — Discharge Summary (Signed)
Physician Discharge Summary  [name]:[id number] DOB: 1965/09/29 DOA: 01/18/2017  PCP: Kaleen Mask, MD  Admit date: 01/18/2017 Discharge date: 02/14/2017   Follow-up 1. Follow Up with MD at skilled nursing facility.  On follow-up will need a basic metabolic profile done in 1 week to follow-up on electrolytes and renal function.  Insulin will need to be adjusted for better blood glucose control. 2.  Speech therapy will need to follow-up patient at skilled nursing facility for follow-up on his dysphagia. 3.  Follow-up with the [facility] wound care center on 02/17/2017 at 730 AM.  4.  Outpatient follow-up with urology for urinary retention and voiding trial in 1 week.    Discharge Diagnoses:  Active Problems:   Atrial flutter (HCC)   Diabetic ketoacidosis with coma associated with type 2 diabetes mellitus (HCC)   Pneumonia   Malnutrition of moderate degree   Pressure injury of skin   Acute respiratory failure with hypoxia (HCC)   Severe sepsis with septic shock (HCC)   Type 2 diabetes mellitus with stage 3 chronic kidney disease (HCC)   Hypernatremia   Hypokalemia   Sacral decubitus ulcer, stage III (HCC)   ARDS (adult respiratory distress syndrome) (HCC)   Acute metabolic encephalopathy   AKI (acute kidney injury) (HCC)   Adjustment disorder with depressed mood   Palliative care by specialist   Goals of care, counseling/discussion   Advance care planning   Discharge Condition: Stable and improved  Diet recommendation: Dysphagia 3 diet with thin liquids, ground meats, extra gravy/source.Ceasar Mons Weights   02/11/17 0446 02/12/17 0541 02/14/17 0500  Weight: 78.1 kg (172 lb 3.2 oz) 75.6 kg (166 lb 11.2 oz) 64.8 kg (142 lb 12.8 oz)    History of present illness:  Per Dr Delton Coombes 52 year old man with a history of obesity, diabetes, hypertension, hyperlipidemia, obstructive sleep apnea, atrial fib/flutter with a history of prior cardioversion.  He called his son  on the date of admission because he was feeling weak and unwell.  He reportedly had not been eating or drinking for the last several days prior to this.  He was brought by EMS to ED and was noted to be hypoglycemic, encephalopathic, hypotensive.  Insulin and IV fluids were initiated, but he remained hypotensive and altered.  Chest x-ray showed a right lower lobe infiltrate and he was started on antibiotics for a community-acquired versus aspiration pneumonia.  He continued to experience an overall decline and was intubated for airway protection in the ED.  Pressors initiated.     Hospital Course:  Brief Narrative:  52 y.o.malewith a history of obesity, diabetes, hypertension, hyperlipidemia, obstructive sleep apnea, atrial fib/flutter with a history of prior cardioversion. He was brought in by EMS and was noted to be hyperglycemic, encephalopathic, hypotensive. found to be in DKA and sepsis due to pneumonia.Due to overall decline, patient was intubated for airway protection on 12/19. He was admitted to ICU. [facility] hospitalist assumed care 12/30.  02/02/2017-placed foley backas he was not able to void. 02/03/2017-severe aspiration risk for oropharyngeal dysphagia and pharyngeal esophageal dysphagia.Discussed reports with the patient today and last night. Last night he reported that he want to think about it and he will give me an answer today. He states that he wants to eat,knowing that his chances of recurrent aspiration and recurrent pneumonia and being on life support may be a vicious cycle. He stated that he understand the consequences and he will face at as it comes. However he wants  to remain a full code and wants to start eating as soon as possible. He also reports that he has been this way for many weeks or months.He reported that he would not be interested in an NG tube or PEG tube at this time.  02/04/2017 patient's wife by the bedside reports that patient ate everything her  breakfast without any trouble this morning. Patient denies any complaints chest pain shortness of breath nausea vomiting or diarrhea.  02/06/2017-speech notes reviewed.patient denies sob.cough earlier when I saw him.  02/07/2017 patient remains in bed he has a rectal tube and a Foley catheter in place. He denied shortness of breath or chest pain.  Patient seen by wound care as well as PT and underwent hydrotherapy.  General surgery consulted.  Acute respiratory failure with hypoxia due to pneumonia and And ARDS: Intubated on 01/18/2017, then extubated on 01/26/2017 and transitioned to BiPAP. Physical therapy evaluated the patient the recommended skilled nursing facility versus LTAC. chest xray 12/31-.Increasing opacities in right greater than left lung bases, likely atelectasis and/or pneumonia. Probable small right effusion.follow up chest xray today.Increasing opacities in right greater than left lung bases, likely atelectasis and/or pneumonia. Probable small right effusion.with clinical improvement.  Patient remained afebrile.  Normal white count.  Patient with a urine output of 1.8 L over the past 24 hours.  Patient is -16.557 L during this hospitalization.  Patient status post IV Lasix.  Status post course of IV antibiotics.  Supportive care.   Hypokalemia  Repleted.  Magnesium has been repleted and currently at 2.0 from 1.2.    Septic shock secondary to community-acquired pneumonia: Patient on admission had to be placed on pressors due to septic shock and was pancultured.  Patient was subsequently placed empirically on IV antibiotics which have been narrowed down.  Now off pressors, sputum cultures with moderate yeast. Blood cultures 1 out of 2 showed contaminant. Patient status post completion of a course of antibiotics.  No further antibiotics needed at this time.  Acute kidney injury: Likely due to prerenal etiology.  Resolved with hydration.  Diabetic ketoacidosis with coma  associated with type 2 diabetes mellitus (HCC) On admission his blood sugar was 800 with a gap and a pH of 7.1. He was started on glucose stabilizer, IV fluids with resolution of DKA.  Patient subsequently transitioned to long-acting insulin and sliding scale insulin.  Due to persistently low blood sugar levels long-acting Lantus has been discontinued.  Hemoglobin A1c was greater than 15.5 on 01/30/2017.  Patient started on Lantus and dose adjusted to 20 units daily in addition to sliding scale insulin.  Outpatient follow-up.    Dysphagia: Speech evaluated the patient last time on 01/27/2017 and recommended n.p.o. ice chips as needed. Formal swallowing evaluation showed moderate to severe dysphagia, they recommended to keep the patient n.p.o.patienthad MBS 02/02/2017.With severe aspiration.Patient requested to give him food to eat knowing consequences that he could get into recurrent pneumonia respiratory failure and needing intubation. This was discussed in detail with the patient. And he wanted to remain full code. Patient has been reassessed by speech therapy who feel patient is making progress with improved secretion management.  Respiratory muscle strength training to maximize respiratory/swallow rehabilitation was demonstrated to the patient by speech therapy.  Diet has been advanced to dysphagia 3 diet per speech therapy recommendations which patient states he is tolerating.  Patient s/p repeat modified barium swallow per speech therapy.  Likely need outpatient follow-up.  Follow.  Severe protein calorie malnutrition with albumin  of 1.6 Patient with significant aspiration concerns due to dysphagia noted by speech therapy.    Therapy patient swallowing improved.  Patient underwent modified barium swallow.  Patient's diet was advanced to a dysphagia 3 diet with thin liquids, aspiration precautions.  Outpatient follow-up.   DTIpressure injury to the sacral area/stage II/III sacral  decubitus  followed by PT for hydrotherapy and packing and debridement with Santyl. Patient has been seen in consultation by general surgery who recommend to continue ongoing hydrotherapy.  Gerhardt's butt cream has been ordered.  Also noted that there were concerns for possible fungal rash and as such patient was started on Diflucan and finished a 7 day course of antifungal.  Patient was initially started on hydrotherapy with PT.  PT hydrotherapy has been discontinued and patient now with nursing dressing changes.  Patient will likely need to follow-up on the wound care center on discharge.  Patient was seen by general surgery was noted that patient had thin eschars on top of his ulcers but tissues underneath were very soft.  Erythema seems to be fungal dermatitis and not cellulitis per general surgery.  Per general surgery no indication for surgical debridement at this time.  Patient is to follow-up with the wound care center.  Probable depression Started on Celexa.  Outpatient follow-up.   Orthostasis Patient's father stated that patient was noted to have complaints of dizziness going from supine to sitting position with physical therapy on 02/09/2017 and per father patient was noted to have a drop in his systolic blood pressure.  Reviewed PTs note and it seems as if patient may have been somewhat orthostatic.  Patient given a bolus of normal saline as well as placed on maintenance IV fluids.  Orthostasis had resolved by day of discharge.  Urinary retention Patient had Foley catheter placed.  Patient underwent a voiding trial which was unsuccessful and as such Foley catheter was placed back in.  Patient be discharged with Foley catheter will need to follow-up with urology in the outpatient setting for further evaluation and voiding trial.      Procedures:  CT head without contrast 01/23/2017  CT abdomen and pelvis 01/19/2017  Chest x-ray 02/05/2017, 01/30/2017  MBS  02/10/2017   Lines/tubes: ETT 12/19 >> 12/27 Femoral CVL 12/19 >> 12/24  Events: 12/19 Admit 12/20 ARDS protocol, start nimbex 12/22 Off nimbex 12/23 Off pressors, sedation  Urine 12/19 >> negative Blood 12/19 >> coag neg Staph Sputum 12/21 >> Candida Glabrata       Consultations:  General surgery: Dr.Toth III 02/07/2017  Wound care 01/25/2017      Discharge Exam: Vitals:   02/14/17 1234 02/14/17 1400  BP: 126/72 129/73  Pulse: 95 93  Resp:  17  Temp: 97.6 F (36.4 C) 97.7 F (36.5 C)  SpO2: 97%     General: NAD Cardiovascular: RRR Respiratory: CTAB Skin:Skin color, texture, turgor normal. No rashes or lesions or see pictures      02/07/17 exam   02/13/17 exam.  Erythema is much better, dry eschar with no drainage and fixed in place.     Discharge Instructions   Discharge Instructions    Diet Carb Modified   Complete by:  As directed    Dysphagia 3 with thin liquids, ground meats, extra gravy or sauce. Aspiration precautions.   Increase activity slowly   Complete by:  As directed      Allergies as of 02/14/2017   No Known Allergies     Medication List  STOP taking these medications   metFORMIN 850 MG tablet Commonly known as:  GLUCOPHAGE     TAKE these medications   aspirin EC 81 MG tablet Take 81 mg by mouth daily.   cholestyramine light 4 g packet Commonly known as:  PREVALITE Take 1 packet (4 g total) by mouth 3 (three) times daily.   citalopram 20 MG tablet Commonly known as:  CELEXA Take 1 tablet (20 mg total) by mouth daily. Start taking on:  02/15/2017   collagenase ointment Commonly known as:  SANTYL Apply topically daily. Apply to coccyx and ischial wounds once daily. Start taking on:  02/15/2017   enoxaparin 40 MG/0.4ML injection Commonly known as:  LOVENOX Inject 0.4 mLs (40 mg total) into the skin daily. Start taking on:  02/15/2017   feeding supplement (PRO-STAT SUGAR FREE 64) Liqd Take 30 mLs by  mouth 2 (two) times daily.   fexofenadine 180 MG tablet Commonly known as:  ALLEGRA Take 180 mg by mouth daily.   Gerhardt's butt cream Crea Apply 1 application topically 3 (three) times daily. Apply to all the red areas on his buttocks and thighs.  Clean with soap and water, dry before each application.   insulin aspart 100 UNIT/ML injection Commonly known as:  novoLOG Inject 0-15 Units into the skin 3 (three) times daily with meals.   insulin aspart 100 UNIT/ML injection Commonly known as:  novoLOG Inject 0-5 Units into the skin at bedtime.   insulin glargine 100 UNIT/ML injection Commonly known as:  LANTUS Inject 0.2 mLs (20 Units total) into the skin daily. Start taking on:  02/15/2017   multivitamin with minerals Tabs tablet Take 1 tablet by mouth daily.   niacin 500 MG tablet Take 500 mg by mouth 2 (two) times daily with a meal.   vitamin C 1000 MG tablet Take 500 mg by mouth daily.      No Known Allergies Follow-up Information    md at SNF Follow up.         [facility]              Follow up on 02/17/2017.   Why:  f/u at 730AM. Contact information: [address] [place] [address] [phone]       [facility]. Schedule an appointment as soon as possible for a visit in 1 week(s).   Why:  urinary retention Contact information: [address] [place] [address] [phone]           The results of significant diagnostics from this hospitalization (including imaging, microbiology, ancillary and laboratory) are listed below for reference.    Significant Diagnostic Studies: Ct Abdomen Pelvis Wo Contrast  Result Date: 01/19/2017 CLINICAL DATA:  Shortness of breath and hyperglycemia, low hemoglobin EXAM: CT ABDOMEN AND PELVIS WITHOUT CONTRAST TECHNIQUE: Multidetector CT imaging of the abdomen and pelvis was performed following the standard protocol  without IV contrast. COMPARISON:  Report 05/17/2011 FINDINGS: Lower chest: Small bilateral pleural effusions. Normal heart size. Coronary artery calcification. Small moderate pneumopericardium and small amount of pneumomediastinum, most of the air is visualized at the anterior base of the heart. Dense consolidation within the bilateral lower lobes with additional multifocal ground-glass density and nodular densities in the right middle and right upper lobes. Distal esophageal thickening. Esophageal tube is looped back upon itself and the tip is positioned at the gastric fundus. Hepatobiliary: High density probable sludge in the gallbladder. No focal  hepatic abnormality or biliary dilatation Pancreas: Slight indistinct appearance of the pancreatic head and uncinate process with minimal surrounding edema. No ductal dilatation Spleen: Normal in size without focal abnormality. Adrenals/Urinary Tract: Adrenal glands are within normal limits. No hydronephrosis. Foley catheter in the bladder Stomach/Bowel: The stomach is non enlarged. No dilated small bowel. No colon wall thickening. Negative appendix. Vascular/Lymphatic: Moderate aortic atherosclerosis. No aneurysmal dilatation. Small retroperitoneal lymph nodes. Reproductive: Seminal vesicle calcification.  Normal prostate size Other: Negative for free air. Small amount of free fluid in the pelvis an adjacent to the liver. Diffuse body wall edema. Musculoskeletal: Degenerative changes. No acute or suspicious bone lesion IMPRESSION: 1. Small bilateral pleural effusions with multifocal consolidations in the bilateral lower lobes, right middle lobe and visible right upper lobe consistent with multifocal pneumonia. Some of the infiltrates appear slightly nodular and imaging follow-up to resolution is recommended. 2. Small moderate amount of pneumopericardium with small amount of pneumomediastinum, uncertain source. 3. Slightly indistinct appearance of the pancreatic head and  uncinate process with suggestion of mild edema, correlate with enzymes to exclude mild pancreatitis. 4. Esophageal tube is looped back upon itself with the tip positioned in the gastric fundus 5. Probable sludge in the gallbladder 6. Small amount of free fluid in the abdomen and pelvis Electronically Signed   By: Jasmine PangKim  Fujinaga M.D.   On: 01/19/2017 22:57   Dg Chest 1 View  Result Date: 02/05/2017 CLINICAL DATA:  Cough, atrial flutter EXAM: CHEST 1 VIEW COMPARISON:  01/30/2017 FINDINGS: There is a linear band of airspace disease in the right mid lung likely reflecting atelectasis. There is no focal consolidation. There is no pleural effusion or pneumothorax. The heart and mediastinal contours are unremarkable. The osseous structures are unremarkable. IMPRESSION: No acute cardiopulmonary disease.  Right mid lung atelectasis. Electronically Signed   By: Elige KoHetal  Patel   On: 02/05/2017 12:22   Dg Chest 1 View  Result Date: 01/18/2017 CLINICAL DATA:  Low saturation levels, dyspnea EXAM: CHEST 1 VIEW COMPARISON:  01/18/2017, 12/08/2011 FINDINGS: Endotracheal tube tip is about 4.6 cm superior to carina. Esophageal tube appears coiled in the stomach. Slightly improved pulmonary expansion with better aeration at the bases. Residual patchy left greater than right lung base infiltrates. Stable cardiomediastinal silhouette. No pneumothorax. IMPRESSION: 1. Support lines and tubes as above 2. Improved expansion with slightly better aeration of lung bases. Patchy left greater than right lung base pulmonary infiltrates. Electronically Signed   By: Jasmine PangKim  Fujinaga M.D.   On: 01/18/2017 20:05   Ct Head Wo Contrast  Result Date: 01/23/2017 CLINICAL DATA:  Altered consciousness. EXAM: CT HEAD WITHOUT CONTRAST TECHNIQUE: Contiguous axial images were obtained from the base of the skull through the vertex without intravenous contrast. COMPARISON:  None. FINDINGS: Brain: No subdural, epidural, or subarachnoid hemorrhage.  Cerebellum, brainstem, and basal cisterns are normal. Ventricles and sulci are mildly prominent. Mild white matter changes identified. No acute cortical ischemia or infarct. No mass effect or midline shift. Vascular: No hyperdense vessel or unexpected calcification. Skull: Normal. Negative for fracture or focal lesion. Sinuses/Orbits: No acute finding. Other: None. IMPRESSION: 1. No acute intracranial process identified. Electronically Signed   By: Gerome Samavid  [name] III M.D   On: 01/23/2017 11:31   Dg Chest Port 1 View  Result Date: 01/30/2017 CLINICAL DATA:  52 y/o  M; acute onset shortness of breath. EXAM: PORTABLE CHEST 1 VIEW COMPARISON:  01/30/2017 chest radiograph FINDINGS: Increasing opacities in the right greater than left lung base. Stable cardiac silhouette.  No acute osseous abnormality is evident. Probable small right effusion. IMPRESSION: Increasing opacities in right greater than left lung bases, likely atelectasis and/or pneumonia. Probable small right effusion. Electronically Signed   By: Mitzi Hansen M.D.   On: 01/30/2017 23:00   Dg Chest Port 1 View  Result Date: 01/30/2017 CLINICAL DATA:  Respiratory failure. History of pneumonia, diabetes, and sepsis EXAM: PORTABLE CHEST 1 VIEW COMPARISON:  Portable chest x-ray of January 25, 2017 FINDINGS: There is persistent volume loss on the right with increased density in the middle and lower lobes. This has improved slightly since yesterday's study. Interstitial density in the infrahilar region on the left is stable. There is no large pleural effusion. The heart is normal in size. The pulmonary vascularity is not engorged. The bony thorax exhibits no acute abnormality. IMPRESSION: Slight improvement in right basilar atelectasis or pneumonia. Stable atelectasis or pneumonia at the left lung base. Electronically Signed   By: David  [place] M.D.   On: 01/30/2017 08:39   Dg Chest Port 1 View  Result Date: 01/25/2017 CLINICAL DATA:  Acute  respiratory failure, hypoxia, intubated EXAM: PORTABLE CHEST 1 VIEW COMPARISON:  01/25/2017 FINDINGS: Limited portable rotated exam. Endotracheal tube at the T 2 level. NG tube coiled in the stomach. Normal heart size and vascularity. Pleural effusions present bilaterally with bibasilar atelectasis/ consolidation, worse on the right. No gross interval change. No pneumothorax. Upper lobes remain clear. IMPRESSION: Stable pleural effusions with bibasilar atelectasis / consolidation worse on the right. No significant interval change. Electronically Signed   By: Judie Petit.  Shick M.D.   On: 01/25/2017 20:13   Dg Chest Port 1 View  Result Date: 01/25/2017 CLINICAL DATA:  Respiratory failure. EXAM: PORTABLE CHEST 1 VIEW COMPARISON:  01/24/2017 and 01/23/2017 FINDINGS: Endotracheal tube remains in good position. NG tube is in the stomach. Haziness at both lung bases is consistent with posterior bilateral effusions. No discrete infiltrates. Heart size and vascularity are normal. Compare with the prior study there has been no change. IMPRESSION: No change since the prior study. Persistent bilateral pleural effusions. Electronically Signed   By: Francene Boyers M.D.   On: 01/25/2017 07:03   Dg Chest Port 1 View  Result Date: 01/24/2017 CLINICAL DATA:  Respiratory failure EXAM: PORTABLE CHEST 1 VIEW COMPARISON:  01/23/2017 FINDINGS: Support devices are stable. Layering bilateral effusions with bilateral lower lobe atelectasis or infiltrates. Heart is upper limits normal in size. No acute bony abnormality. IMPRESSION: Stable layering bilateral effusion and bilateral lower lobe opacities. Electronically Signed   By: Charlett Nose M.D.   On: 01/24/2017 07:08   Dg Chest Port 1 View  Result Date: 01/23/2017 CLINICAL DATA:  Acute respiratory failure.  Ventilator support. EXAM: PORTABLE CHEST 1 VIEW COMPARISON:  01/22/2017 FINDINGS: Endotracheal tube tip is 3.5 cm above the carina. Nasogastric tube enters the abdomen. Persistent  bilateral effusions with lower lobe atelectasis and/or pneumonia. Similar appearance to yesterday. No worsening or new finding. IMPRESSION: No change. Bilateral effusions and lower lobe atelectasis and or infiltrate. Electronically Signed   By: Paulina Fusi M.D.   On: 01/23/2017 06:51   Dg Chest Port 1 View  Result Date: 01/22/2017 CLINICAL DATA:  Acute respiratory failure EXAM: PORTABLE CHEST 1 VIEW COMPARISON:  Yesterday FINDINGS: Endotracheal tube tip at the clavicular heads. An orogastric tube reaches the stomach where it is coiled. Haziness of the lower chest from pneumonia on abdominal CT from 3 days ago. There is also layering pleural effusions. No pneumothorax or Kerley lines. IMPRESSION:  1. Stable positioning of endotracheal orogastric tubes. 2. Bilateral lower lobe pneumonia with layering effusions. No visible progression. Electronically Signed   By: Marnee Spring M.D.   On: 01/22/2017 07:03   Dg Chest Port 1 View  Result Date: 01/21/2017 CLINICAL DATA:  Respiratory failure EXAM: PORTABLE CHEST 1 VIEW COMPARISON:  Yesterday FINDINGS: Endotracheal tube tip at the clavicular heads. An orogastric tube reaches the stomach where it is coiled. Haziness of the lower chest from pneumonia and small effusions based on abdominal CT yesterday. There was pneumomediastinum on that study that is not seen radiographically. IMPRESSION: 1. Stable positioning of endotracheal and orogastric tubes. 2. Bilateral lower lobe pneumonia as confirmed on abdominal CT yesterday. 3. Pneumomediastinum on previous CT, not visible radiographically. Electronically Signed   By: Marnee Spring M.D.   On: 01/21/2017 07:07   Dg Chest Port 1 View  Result Date: 01/20/2017 CLINICAL DATA:  52 year old male with shortness of breath, intubated. Lower lobe pneumonia and pleural effusions on CT Abdomen and Pelvis yesterday. EXAM: PORTABLE CHEST 1 VIEW COMPARISON:  01/19/2017 and earlier. FINDINGS: Portable AP semi upright view at  0503 hours. Endotracheal tube tip is stable up the level the clavicles. Enteric tube is stable looped in the stomach. Confluent bilateral lung base opacity has progressed since portable radiographs at 0433 hours on 01/19/2017. A combination of lower lobe consolidation and small pleural effusion is demonstrated on the CT yesterday. Mediastinal contours remain normal. No pneumothorax. Stable pulmonary vascularity without edema. IMPRESSION: 1.  Stable lines and tubes. 2. Bilateral lower lobe pneumonia and small pleural effusions, radiographically progressed since 24 hours earlier. Electronically Signed   By: Odessa Fleming M.D.   On: 01/20/2017 08:39   Dg Chest Port 1 View  Result Date: 01/19/2017 CLINICAL DATA:  Pneumonia EXAM: PORTABLE CHEST 1 VIEW COMPARISON:  Yesterday FINDINGS: Endotracheal tube tip at the clavicular heads. An orogastric tube reaches the stomach. Bilateral airspace disease that has progressed in density and extent. No visible cavitation or effusion. No pneumothorax. Normal heart size, distorted by rotation IMPRESSION: 1. Stable positioning of endotracheal and orogastric tubes. 2. Bilateral pneumonia with progressive consolidation. Electronically Signed   By: Marnee Spring M.D.   On: 01/19/2017 07:05   Dg Chest Port 1 View  Result Date: 01/18/2017 CLINICAL DATA:  Endotracheal placement. EXAM: PORTABLE CHEST 1 VIEW COMPARISON:  Earlier same day FINDINGS: Endotracheal tip is 3.5 cm above the carina. Nasogastric tube is placed but doubles back upon itself and is present within the distal esophagus. There is worsened infiltrate and collapse in both lower lobes, right worse than left. IMPRESSION: Worsening pneumonia and collapse in the lower lobes right worse than left. Nasogastric tube bent back upon itself, only in the distal esophagus. Endotracheal tube well positioned. Electronically Signed   By: Paulina Fusi M.D.   On: 01/18/2017 15:59   Dg Chest Port 1 View  Result Date:  01/18/2017 CLINICAL DATA:  Shortness of breath. EXAM: PORTABLE CHEST 1 VIEW COMPARISON:  Radiographs of December 08, 2011. FINDINGS: The heart size and mediastinal contours are within normal limits. Hypoinflation of the lungs is noted. Left lung is clear. Increased right basilar opacity is noted concerning for pneumonia. The visualized skeletal structures are unremarkable. IMPRESSION: Increased right basilar opacity is noted consistent with pneumonia. Followup PA and lateral chest X-ray is recommended in 3-4 weeks following trial of antibiotic therapy to ensure resolution and exclude underlying malignancy. Electronically Signed   By: Lupita Raider, M.D.   On: 01/18/2017  12:53   Dg Swallowing Func-speech Pathology  Result Date: 02/10/2017 Objective Swallowing Evaluation: Type of Study: MBS-Modified Barium Swallow Study  Patient Details Name: [name] MRN: [id number] Date of Birth: November 11, 1965 Today's Date: 02/10/2017 Time: SLP Start Time (ACUTE ONLY): 1218 -SLP Stop Time (ACUTE ONLY): 1250 SLP Time Calculation (min) (ACUTE ONLY): 32 min Past Medical History: Past Medical History: Diagnosis Date . Atrial fib/flutter, transient 09/19/2011 . Elevated cholesterol with elevated triglycerides  . Hypertension  . Kidney stones  . Pancreatitis 1999 . Pneumonia ~ 2003 . Sleep apnea   nO OFFICALLY DIAGNOSIED, SNORES LOUDLY AND WIFE HAS WITNESSED APNEA WHEN HE IS SLEEPING . Type II diabetes mellitus (HCC)  Past Surgical History: Past Surgical History: Procedure Laterality Date . CARDIOVERSION  09/30/2011  Procedure: CARDIOVERSION;  Surgeon: Wendall Stade, MD;  Location: [facility] ENDOSCOPY;  Service: Cardiovascular;  Laterality: N/A; . EYE SURGERY   . PARS PLANA VITRECTOMY  12/08/2011  left . PARS PLANA VITRECTOMY  12/08/2011  Procedure: PARS PLANA VITRECTOMY WITH 25 GAUGE;  Surgeon: Sherrie George, MD;  Location: [facility] OR;  Service: Ophthalmology;  Laterality: Left; . TEE WITHOUT CARDIOVERSION  09/30/2011  Procedure:  TRANSESOPHAGEAL ECHOCARDIOGRAM (TEE);  Surgeon: Wendall Stade, MD;  Location: [facility] ENDOSCOPY;  Service: Cardiovascular;  Laterality: N/A;  kristine/ebp/beverly ( or scheduling)/ time rescheduled from 1300 to 1200 talked ( mary) [name] adm to [facility] with weakness, decreased appetite and respiratory difficulites.  Pt required intubation from 12/19-12/27.  PMH + for HONK, pancreatitis, sleep apnea,   Swallow evaluation ordered. Pt underwent MBS on Monday Dec 31st and palliative meeting conducted.  Pt desire is to get well and go home.  Repeat MBS indicated to allow instrumental evaluation prior to dietary advancement.  Pt last MBS showed aspiration of all consistencies during MBS and gross weakness without ability to clear penetrates/aspirate despite head turn with chin tuck.  Pt has been po x ice chips   Subjective: pt awake in chair, complains of pain on bottom Assessment / Plan / Recommendation CHL IP CLINICAL IMPRESSIONS 02/10/2017 Clinical Impression Patient presents with moderate oropharyngeal dysphagia with marginally improvement compared to prior MBS.  His tongue base retraction/laryngeal elevation/closure remains impaired results in severe residuals and SILENT trace aspiration/penetration of thin, nectar.  Worsening residuals with pudding/cracker noted.  Chin tuck helpful to mitigate dysphagia,decrease residuals.  Pt minimal sensation to gross residuals during today's evaluation.  Clinically he appears much better than during instrumental evaluation and therefore close monitoring of vitals will be indicated. Chin tuck posture helpful to decrease vallecular accumulation with solids but worsens airway protection with liquids.  Head turn right or left - not helpful.  Cued cough/throat clear removed trace penetration/aspiration but not deeper mild aspirates.  Pt has been tolerating likely chronic aspiration at this time and for him to receive more substantial nutrition with known risks, dietary advancement  indicated.  Dys3/thin recommended with strict precautions.  Using live video, educated pt and father to findings/recommendations.  SLP Visit Diagnosis Dysphagia, oropharyngeal phase (R13.12);Dysphagia, pharyngoesophageal phase (R13.14) Attention and concentration deficit following -- Frontal lobe and executive function deficit following -- Impact on safety and function Moderate aspiration risk;Severe aspiration risk   CHL IP TREATMENT RECOMMENDATION 02/02/2017 Treatment Recommendations Therapy as outlined in treatment plan below   Prognosis 02/10/2017 Prognosis for Safe Diet Advancement Fair Barriers to Reach Goals Cognitive deficits;Severity of deficits;Motivation Barriers/Prognosis Comment -- CHL IP DIET RECOMMENDATION 02/10/2017 SLP Diet Recommendations Dysphagia 3 (Mech soft) solids;Thin liquid Liquid Administration  via Cup Medication Administration Whole meds with puree Compensations Minimize environmental distractions;Slow rate;Small sips/bites;Chin tuck Postural Changes Remain semi-upright after after feeds/meals (Comment);Seated upright at 90 degrees   CHL IP OTHER RECOMMENDATIONS 02/10/2017 Recommended Consults -- Oral Care Recommendations Oral care QID Other Recommendations --   CHL IP FOLLOW UP RECOMMENDATIONS 02/10/2017 Follow up Recommendations Skilled Nursing facility   [facility] IP FREQUENCY AND DURATION 02/10/2017 Speech Therapy Frequency (ACUTE ONLY) min 2x/week Treatment Duration 2 weeks      CHL IP ORAL PHASE 02/10/2017 Oral Phase Impaired Oral - Pudding Teaspoon -- Oral - Pudding Cup -- Oral - Honey Teaspoon NT Oral - Honey Cup -- Oral - Nectar Teaspoon NT Oral - Nectar Cup Reduced posterior propulsion;Weak lingual manipulation;Premature spillage Oral - Nectar Straw Reduced posterior propulsion;Weak lingual manipulation;Premature spillage Oral - Thin Teaspoon Weak lingual manipulation;Reduced posterior propulsion;Premature spillage Oral - Thin Cup Weak lingual manipulation;Reduced posterior  propulsion;Premature spillage Oral - Thin Straw Weak lingual manipulation;Reduced posterior propulsion;Premature spillage Oral - Puree Weak lingual manipulation;Reduced posterior propulsion;Premature spillage Oral - Mech Soft Weak lingual manipulation;Impaired mastication;Reduced posterior propulsion;Premature spillage Oral - Regular -- Oral - Multi-Consistency -- Oral - Pill -- Oral Phase - Comment --  CHL IP PHARYNGEAL PHASE 02/10/2017 Pharyngeal Phase -- Pharyngeal- Pudding Teaspoon -- Pharyngeal -- Pharyngeal- Pudding Cup -- Pharyngeal -- Pharyngeal- Honey Teaspoon NT Pharyngeal -- Pharyngeal- Honey Cup -- Pharyngeal -- Pharyngeal- Nectar Teaspoon NT Pharyngeal -- Pharyngeal- Nectar Cup Reduced laryngeal elevation;Reduced anterior laryngeal mobility;Reduced epiglottic inversion;Reduced pharyngeal peristalsis;Reduced airway/laryngeal closure;Penetration/Aspiration during swallow Pharyngeal Material enters airway, remains ABOVE vocal cords and not ejected out Pharyngeal- Nectar Straw Reduced pharyngeal peristalsis;Reduced epiglottic inversion;Reduced anterior laryngeal mobility;Reduced laryngeal elevation;Reduced airway/laryngeal closure;Penetration/Aspiration during swallow Pharyngeal Material enters airway, remains ABOVE vocal cords and not ejected out;Material enters airway, CONTACTS cords and not ejected out;Material enters airway, passes BELOW cords without attempt by patient to eject out (silent aspiration) Pharyngeal- Thin Teaspoon Reduced pharyngeal peristalsis;Reduced epiglottic inversion;Reduced anterior laryngeal mobility;Reduced laryngeal elevation;Reduced airway/laryngeal closure;Reduced tongue base retraction Pharyngeal Material enters airway, remains ABOVE vocal cords and not ejected out;Material enters airway, passes BELOW cords without attempt by patient to eject out (silent aspiration) Pharyngeal- Thin Cup Reduced pharyngeal peristalsis;Reduced epiglottic inversion;Reduced anterior laryngeal  mobility;Reduced laryngeal elevation;Reduced airway/laryngeal closure;Reduced tongue base retraction;Penetration/Aspiration during swallow Pharyngeal Material enters airway, passes BELOW cords without attempt by patient to eject out (silent aspiration);Material enters airway, remains ABOVE vocal cords and not ejected out;Material enters airway, CONTACTS cords and not ejected out Pharyngeal- Thin Straw -- Pharyngeal -- Pharyngeal- Puree Reduced pharyngeal peristalsis;Reduced epiglottic inversion;Reduced anterior laryngeal mobility;Reduced laryngeal elevation;Reduced airway/laryngeal closure;Reduced tongue base retraction;Pharyngeal residue - valleculae;Pharyngeal residue - pyriform Pharyngeal -- Pharyngeal- Mechanical Soft Reduced pharyngeal peristalsis;Reduced epiglottic inversion;Reduced anterior laryngeal mobility;Reduced laryngeal elevation;Reduced tongue base retraction;Pharyngeal residue - valleculae;Pharyngeal residue - pyriform Pharyngeal -- Pharyngeal- Regular -- Pharyngeal -- Pharyngeal- Multi-consistency -- Pharyngeal -- Pharyngeal- Pill -- Pharyngeal -- Pharyngeal Comment --  CHL IP CERVICAL ESOPHAGEAL PHASE 02/10/2017 Cervical Esophageal Phase Impaired Pudding Teaspoon -- Pudding Cup -- Honey Teaspoon -- Honey Cup -- Nectar Teaspoon -- Nectar Cup -- Nectar Straw -- Thin Teaspoon -- Thin Cup -- Thin Straw -- Puree -- Mechanical Soft -- Regular -- Multi-consistency -- Pill -- Cervical Esophageal Comment decreased UES opening - resulting in residuals without pt awareness, appearance of decreased clearance throughout esophagus without pt sensation, esophagus appeared dilated - radiologist not present to confirm No flowsheet data found. Chales Abrahams 02/10/2017, 2:47 PM  Donavan Burnet, MS [facility] SLP [phone]  Dg Swallowing Func-speech Pathology  Result Date: 02/02/2017 Objective Swallowing Evaluation: Type of Study: MBS-Modified Barium Swallow Study  Patient Details Name: [name] MRN:  [id number] Date of Birth: April 13, 1965 Today's Date: 02/02/2017 Time: SLP Start Time (ACUTE ONLY): 1405 -SLP Stop Time (ACUTE ONLY): 1435 SLP Time Calculation (min) (ACUTE ONLY): 30 min Past Medical History: Past Medical History: Diagnosis Date . Atrial fib/flutter, transient 09/19/2011 . Elevated cholesterol with elevated triglycerides  . Hypertension  . Kidney stones  . Pancreatitis 1999 . Pneumonia ~ 2003 . Sleep apnea   nO OFFICALLY DIAGNOSIED, SNORES LOUDLY AND WIFE HAS WITNESSED APNEA WHEN HE IS SLEEPING . Type II diabetes mellitus (HCC)  Past Surgical History: Past Surgical History: Procedure Laterality Date . CARDIOVERSION  09/30/2011  Procedure: CARDIOVERSION;  Surgeon: Wendall Stade, MD;  Location: [facility] ENDOSCOPY;  Service: Cardiovascular;  Laterality: N/A; . EYE SURGERY   . PARS PLANA VITRECTOMY  12/08/2011  left . PARS PLANA VITRECTOMY  12/08/2011  Procedure: PARS PLANA VITRECTOMY WITH 25 GAUGE;  Surgeon: Sherrie George, MD;  Location: [facility] OR;  Service: Ophthalmology;  Laterality: Left; . TEE WITHOUT CARDIOVERSION  09/30/2011  Procedure: TRANSESOPHAGEAL ECHOCARDIOGRAM (TEE);  Surgeon: Wendall Stade, MD;  Location: [facility] ENDOSCOPY;  Service: Cardiovascular;  Laterality: N/A;  kristine/ebp/beverly ( or scheduling)/ time rescheduled from 1300 to 1200 talked ( mary) HPI: 51 yo male adm to [facility] with weakness, decreased appetite and respiratory difficulites.  Pt required intubation from 12/19-12/27.  PMH + for HONK, pancreatitis, sleep apnea,   Swallow evaluation ordered. Pt underwent MBS on Monday Dec 31st and palliative meeting conducted.  Pt desire is to get well and go home.  Repeat MBS indicated to allow instrumental evaluation prior to dietary advancement.  Pt last MBS showed aspiration of all consistencies during MBS and gross weakness without ability to clear penetrates/aspirate despite head turn with chin tuck.  Pt has been po x ice chips   Subjective: pt awake in chair Assessment / Plan / Recommendation CHL IP  CLINICAL IMPRESSIONS 02/02/2017 Clinical Impression Limited assessment due to pt stating "I feel like I"m going to give out" and becoming slightly ashen with oxygen desaturation after only a FEW boluses.  SLP assisted to return pt to bed and RTs transported pt to his room in ICU.  Pt reported feeling better after being returned to his bed and did not appear dyspneic.  Pt continues with moderate oral and severe pharyngo-cervical esophageal dysphagia.  Decreased timing and adequacy of laryngeal closure results in recurrent penetration and eventual aspiration (of penetrates) of all consistencies tested  - (nectar, thin).  Cued throat clearing moved some penetrates into pharynx but pt repenetrated with each swallow.  Aspiration of secretions mixed with barium occured consistently during testing without pt sensation.  Severe pharyngeal residuals continue despite head turn, multiple swallows, and chin tuck posture.    Pt does not clear residuals with dry swallows and was able to conduct dry swallows initially.  Once pt became fatigued after a few boluses ONLY he stated "That's all I got" and could not or did not dry swallow on command.  Using live video educated pt to findings/concerns. As pt without functional swallow improvement and aspiration of secretions noted - doubtful for functional swallow improvement in his acute stay - advised pt concerns.  SLP Visit Diagnosis Dysphagia, oropharyngeal phase (R13.12);Dysphagia, pharyngoesophageal phase (R13.14) Attention and concentration deficit following -- Frontal lobe and executive function deficit following -- Impact on safety and function Severe aspiration risk;Risk  for inadequate nutrition/hydration   CHL IP TREATMENT RECOMMENDATION 02/02/2017 Treatment Recommendations Therapy as outlined in treatment plan below   Prognosis 02/02/2017 Prognosis for Safe Diet Advancement Fair Barriers to Reach Goals Cognitive deficits;Severity of deficits;Motivation Barriers/Prognosis Comment  -- CHL IP DIET RECOMMENDATION 02/02/2017 SLP Diet Recommendations NPO;Ice chips PRN after oral care Liquid Administration via -- Medication Administration -- Compensations -- Postural Changes --   CHL IP OTHER RECOMMENDATIONS 01/30/2017 Recommended Consults -- Oral Care Recommendations Oral care QID Other Recommendations --   CHL IP FOLLOW UP RECOMMENDATIONS 01/30/2017 Follow up Recommendations Skilled Nursing facility   [facility] IP FREQUENCY AND DURATION 02/02/2017 Speech Therapy Frequency (ACUTE ONLY) min 2x/week Treatment Duration 2 weeks      CHL IP ORAL PHASE 02/02/2017 Oral Phase Impaired Oral - Pudding Teaspoon -- Oral - Pudding Cup -- Oral - Honey Teaspoon NT Oral - Honey Cup -- Oral - Nectar Teaspoon Reduced posterior propulsion;Premature spillage Oral - Nectar Cup Reduced posterior propulsion;Premature spillage Oral - Nectar Straw Reduced posterior propulsion;Premature spillage Oral - Thin Teaspoon Reduced posterior propulsion;Premature spillage Oral - Thin Cup Premature spillage;Reduced posterior propulsion Oral - Thin Straw -- Oral - Puree NT Oral - Mech Soft -- Oral - Regular -- Oral - Multi-Consistency -- Oral - Pill -- Oral Phase - Comment --  CHL IP PHARYNGEAL PHASE 02/02/2017 Pharyngeal Phase Impaired Pharyngeal- Pudding Teaspoon -- Pharyngeal -- Pharyngeal- Pudding Cup -- Pharyngeal -- Pharyngeal- Honey Teaspoon NT Pharyngeal -- Pharyngeal- Honey Cup -- Pharyngeal -- Pharyngeal- Nectar Teaspoon Reduced pharyngeal peristalsis;Reduced epiglottic inversion;Reduced anterior laryngeal mobility;Reduced laryngeal elevation;Reduced airway/laryngeal closure;Reduced tongue base retraction;Penetration/Aspiration during swallow;Penetration/Apiration after swallow;Pharyngeal residue - pyriform;Pharyngeal residue - valleculae Pharyngeal Material enters airway, passes BELOW cords without attempt by patient to eject out (silent aspiration);Material enters airway, CONTACTS cords and not ejected out Pharyngeal- Nectar Cup --  Pharyngeal -- Pharyngeal- Nectar Straw Compensatory strategies attempted (with notebox);Reduced airway/laryngeal closure;Reduced tongue base retraction;Penetration/Aspiration before swallow;Reduced laryngeal elevation;Reduced anterior laryngeal mobility;Reduced epiglottic inversion;Reduced pharyngeal peristalsis;Moderate aspiration;Pharyngeal residue - valleculae;Pharyngeal residue - pyriform Pharyngeal Material enters airway, passes BELOW cords without attempt by patient to eject out (silent aspiration);Material enters airway, remains ABOVE vocal cords and not ejected out Pharyngeal- Thin Teaspoon Reduced laryngeal elevation;Reduced anterior laryngeal mobility;Reduced epiglottic inversion;Reduced pharyngeal peristalsis;Reduced airway/laryngeal closure;Penetration/Apiration after swallow;Penetration/Aspiration during swallow;Pharyngeal residue - valleculae;Pharyngeal residue - pyriform;Trace aspiration Pharyngeal Material enters airway, remains ABOVE vocal cords and not ejected out;Material enters airway, passes BELOW cords without attempt by patient to eject out (silent aspiration) Pharyngeal- Thin Cup Reduced pharyngeal peristalsis;Reduced epiglottic inversion;Reduced anterior laryngeal mobility;Reduced laryngeal elevation;Reduced airway/laryngeal closure;Reduced tongue base retraction;Penetration/Aspiration during swallow;Penetration/Apiration after swallow;Moderate aspiration;Pharyngeal residue - pyriform;Pharyngeal residue - valleculae Pharyngeal Material enters airway, remains ABOVE vocal cords and not ejected out;Material enters airway, passes BELOW cords without attempt by patient to eject out (silent aspiration) Pharyngeal- Thin Straw -- Pharyngeal -- Pharyngeal- Puree NT Pharyngeal -- Pharyngeal- Mechanical Soft -- Pharyngeal -- Pharyngeal- Regular -- Pharyngeal -- Pharyngeal- Multi-consistency -- Pharyngeal -- Pharyngeal- Pill -- Pharyngeal -- Pharyngeal Comment --  CHL IP CERVICAL ESOPHAGEAL PHASE  02/02/2017 Cervical Esophageal Phase Impaired Pudding Teaspoon -- Pudding Cup -- Honey Teaspoon -- Honey Cup -- Nectar Teaspoon -- Nectar Cup -- Nectar Straw -- Thin Teaspoon -- Thin Cup -- Thin Straw -- Puree -- Mechanical Soft -- Regular -- Multi-consistency -- Pill -- Cervical Esophageal Comment decrease UES opening resulting in residuals at pyriform sinus that pt did not clear No flowsheet data found. Donavan Burnet, MS [facility] SLP [phone]  Dg Swallowing Func-speech Pathology  Result Date: 01/30/2017 Objective Swallowing Evaluation: Type of Study: MBS-Modified Barium Swallow Study  Patient Details Name: [name] MRN: [id number] Date of Birth: 12/10/1965 Today's Date: 01/30/2017 Time: SLP Start Time (ACUTE ONLY): 1326 -SLP Stop Time (ACUTE ONLY): 1350 SLP Time Calculation (min) (ACUTE ONLY): 24 min Past Medical History: Past Medical History: Diagnosis Date . Atrial fib/flutter, transient 09/19/2011 . Elevated cholesterol with elevated triglycerides  . Hypertension  . Kidney stones  . Pancreatitis 1999 . Pneumonia ~ 2003 . Sleep apnea   nO OFFICALLY DIAGNOSIED, SNORES LOUDLY AND WIFE HAS WITNESSED APNEA WHEN HE IS SLEEPING . Type II diabetes mellitus (HCC)  Past Surgical History: Past Surgical History: Procedure Laterality Date . CARDIOVERSION  09/30/2011  Procedure: CARDIOVERSION;  Surgeon: Wendall Stade, MD;  Location: [facility] ENDOSCOPY;  Service: Cardiovascular;  Laterality: N/A; . EYE SURGERY   . PARS PLANA VITRECTOMY  12/08/2011  left . PARS PLANA VITRECTOMY  12/08/2011  Procedure: PARS PLANA VITRECTOMY WITH 25 GAUGE;  Surgeon: Sherrie George, MD;  Location: [facility] OR;  Service: Ophthalmology;  Laterality: Left; . TEE WITHOUT CARDIOVERSION  09/30/2011  Procedure: TRANSESOPHAGEAL ECHOCARDIOGRAM (TEE);  Surgeon: Wendall Stade, MD;  Location: [facility] ENDOSCOPY;  Service: Cardiovascular;  Laterality: N/A;  kristine/ebp/beverly ( or scheduling)/ time rescheduled from 1300 to 1200 talked ( mary) [name]  adm to [facility] with weakness, decreased appetite and respiratory difficulites.  Pt required intubation from 12/19-12/27.  PMH + for HONK, pancreatitis, sleep apnea,   Swallow evaluation ordered. Pt unable to have MBS on Friday 12/28 due to respiratory issues.  Today is appropriate for MBS per RN.   Subjective: pt awake in bed Assessment / Plan / Recommendation CHL IP CLINICAL IMPRESSIONS 01/30/2017 Clinical Impression Pt presents with moderate oral and severe pharyngeal dysphagia with sensorimotor deficits.  His laryngeal elevation/closure is impaired results in severe residuals (pyriform more than valleculae) and SILENT trace aspiration/penetration of EVERY consistency tested *thin, nectar, honey and pudding.  Pt has NO sensation to gross residuals nor aspiration and cued cough did not clear aspirates.  Chin tuck posture, head turn right *side pt was leaning to*, cued dry swallows and cued strong cough not helpful to prevent or clear aspirates.  In addition, pt oxygen saturation dropped to 80s - only coming up with max cues for breaths.  He was also internally distracted as he complained of discomfort with urination attempts and repeated requested for head of chair to be lowered.  This impacted his participation during the session. If pt is to have po diet = it should be with known aspiration risk vs NPO x ice chips and oral care.  Hopeful for pt to improve with medical advancement but concerning for possible premorbid dysphagia/aspiration. Will advise MD to finding. Will follow up February 01, 2017 for dysphagia treatment/management.  SLP Visit Diagnosis Dysphagia, oropharyngeal phase (R13.12);Dysphagia, pharyngoesophageal phase (R13.14) Attention and concentration deficit following -- Frontal lobe and executive function deficit following -- Impact on safety and function Severe aspiration risk;Risk for inadequate nutrition/hydration   CHL IP TREATMENT RECOMMENDATION 01/30/2017 Treatment Recommendations Therapy as  outlined in treatment plan below   Prognosis 01/30/2017 Prognosis for Safe Diet Advancement Fair Barriers to Reach Goals Cognitive deficits;Severity of deficits;Motivation Barriers/Prognosis Comment -- CHL IP DIET RECOMMENDATION 01/30/2017 SLP Diet Recommendations NPO;Ice chips PRN after oral care Liquid Administration via -- Medication Administration Via alternative means Compensations -- Postural Changes --   CHL IP OTHER RECOMMENDATIONS 01/30/2017 Recommended Consults --  Oral Care Recommendations Oral care QID Other Recommendations --   CHL IP FOLLOW UP RECOMMENDATIONS 01/30/2017 Follow up Recommendations Skilled Nursing facility   [facility] IP FREQUENCY AND DURATION 01/30/2017 Speech Therapy Frequency (ACUTE ONLY) min 2x/week Treatment Duration 2 weeks      CHL IP ORAL PHASE 01/30/2017 Oral Phase Impaired Oral - Pudding Teaspoon -- Oral - Pudding Cup -- Oral - Honey Teaspoon Reduced posterior propulsion Oral - Honey Cup -- Oral - Nectar Teaspoon Reduced posterior propulsion Oral - Nectar Cup Reduced posterior propulsion Oral - Nectar Straw Reduced posterior propulsion Oral - Thin Teaspoon Reduced posterior propulsion Oral - Thin Cup Reduced posterior propulsion Oral - Thin Straw -- Oral - Puree Reduced posterior propulsion Oral - Mech Soft -- Oral - Regular -- Oral - Multi-Consistency -- Oral - Pill -- Oral Phase - Comment --  CHL IP PHARYNGEAL PHASE 01/30/2017 Pharyngeal Phase Impaired Pharyngeal- Pudding Teaspoon -- Pharyngeal -- Pharyngeal- Pudding Cup -- Pharyngeal -- Pharyngeal- Honey Teaspoon Reduced pharyngeal peristalsis;Reduced epiglottic inversion;Reduced anterior laryngeal mobility;Reduced laryngeal elevation;Reduced airway/laryngeal closure;Reduced tongue base retraction;Penetration/Aspiration during swallow;Penetration/Apiration after swallow;Trace aspiration;Pharyngeal residue - valleculae;Pharyngeal residue - pyriform Pharyngeal Material enters airway, passes BELOW cords without attempt by patient to  eject out (silent aspiration) Pharyngeal- Honey Cup -- Pharyngeal -- Pharyngeal- Nectar Teaspoon Reduced pharyngeal peristalsis;Reduced epiglottic inversion;Reduced anterior laryngeal mobility;Reduced laryngeal elevation;Reduced airway/laryngeal closure;Reduced tongue base retraction;Pharyngeal residue - valleculae;Pharyngeal residue - pyriform;Penetration/Aspiration before swallow;Penetration/Apiration after swallow;Moderate aspiration Pharyngeal Material enters airway, passes BELOW cords without attempt by patient to eject out (silent aspiration) Pharyngeal- Nectar Cup -- Pharyngeal -- Pharyngeal- Nectar Straw Reduced pharyngeal peristalsis;Reduced epiglottic inversion;Reduced anterior laryngeal mobility;Reduced laryngeal elevation;Reduced airway/laryngeal closure;Reduced tongue base retraction;Moderate aspiration;Penetration/Aspiration during swallow;Penetration/Apiration after swallow Pharyngeal Material enters airway, passes BELOW cords without attempt by patient to eject out (silent aspiration) Pharyngeal- Thin Teaspoon Reduced pharyngeal peristalsis;Reduced epiglottic inversion;Reduced anterior laryngeal mobility;Reduced laryngeal elevation;Reduced airway/laryngeal closure;Reduced tongue base retraction;Pharyngeal residue - valleculae;Pharyngeal residue - pyriform Pharyngeal -- Pharyngeal- Thin Cup Reduced laryngeal elevation;Reduced anterior laryngeal mobility;Reduced epiglottic inversion;Reduced pharyngeal peristalsis;Reduced airway/laryngeal closure;Reduced tongue base retraction;Pharyngeal residue - valleculae;Pharyngeal residue - pyriform;Moderate aspiration;Penetration/Aspiration during swallow;Penetration/Apiration after swallow Pharyngeal Material enters airway, passes BELOW cords without attempt by patient to eject out (silent aspiration) Pharyngeal- Thin Straw -- Pharyngeal -- Pharyngeal- Puree Pharyngeal residue - pyriform;Pharyngeal residue - valleculae;Reduced pharyngeal peristalsis;Reduced  epiglottic inversion;Reduced anterior laryngeal mobility;Reduced laryngeal elevation;Reduced airway/laryngeal closure;Reduced tongue base retraction;Penetration/Apiration after swallow Pharyngeal Material enters airway, passes BELOW cords without attempt by patient to eject out (silent aspiration) Pharyngeal- Mechanical Soft -- Pharyngeal -- Pharyngeal- Regular -- Pharyngeal -- Pharyngeal- Multi-consistency -- Pharyngeal -- Pharyngeal- Pill -- Pharyngeal -- Pharyngeal Comment --  CHL IP CERVICAL ESOPHAGEAL PHASE 01/30/2017 Cervical Esophageal Phase Impaired Pudding Teaspoon -- Pudding Cup -- Honey Teaspoon -- Honey Cup -- Nectar Teaspoon -- Nectar Cup -- Nectar Straw -- Thin Teaspoon -- Thin Cup -- Thin Straw -- Puree -- Mechanical Soft -- Regular -- Multi-consistency -- Pill -- Cervical Esophageal Comment appearance of decreased clearance at distal esophagus without pt awareness;  pt's esophagus appeared dilated - radiologist not present to confirm No flowsheet data found. Donavan Burnet, MS [facility] SLP [phone]               Microbiology: No results found for this or any previous visit (from the past 240 hour(s)).   Labs: Basic Metabolic Panel: Recent Labs  Lab 02/08/17 0842 02/09/17 0406 02/10/17 4540 02/11/17 0421 02/12/17 0531 02/13/17 0442 02/14/17 0501  NA 137 137 135 135 132* 134* 134*  K 3.6 3.2* 4.2 3.4* 4.2 3.5 3.8  CL 106 108 107 109 107 105 104  CO2 22 24 22  21* 17* 22 22  GLUCOSE 191* 103* 264* 98 361* 355* 390*  BUN 13 12 12 12 14 18 18   CREATININE 1.00 0.83 0.82 0.73 0.74 0.78 0.68  CALCIUM 7.2* 7.4* 7.7* 7.4* 7.6* 8.1* 7.9*  MG 1.2* 2.0  --   --   --   --   --    Liver Function Tests: Recent Labs  Lab 02/08/17 0842  AST 15  ALT 11*  ALKPHOS 154*  BILITOT 0.4  PROT 7.1  ALBUMIN 1.6*   No results for input(s): LIPASE, AMYLASE in the last 168 hours. No results for input(s): AMMONIA in the last 168 hours. CBC: Recent Labs  Lab 02/08/17 0842 02/09/17 0406  02/11/17 0421 02/13/17 0442  WBC 9.0 8.5 9.8 11.2*  NEUTROABS 6.4 5.2 5.8 7.9*  HGB 8.8* 8.2* 8.2* 8.8*  HCT 27.0* 25.2* 24.8* 26.8*  MCV 84.6 84.3 84.9 82.7  PLT 303 329 335 349   Cardiac Enzymes: No results for input(s): CKTOTAL, CKMB, CKMBINDEX, TROPONINI in the last 168 hours. BNP: BNP (last 3 results) No results for input(s): BNP in the last 8760 hours.  ProBNP (last 3 results) No results for input(s): PROBNP in the last 8760 hours.  CBG: Recent Labs  Lab 02/13/17 1645 02/13/17 2112 02/14/17 0737 02/14/17 1236 02/14/17 1424  GLUCAP 164* 178* 382* 303* 360*       Signed:  Ramiro Harvest MD.  [facility] 02/14/2017, 3:33 PM

## 2017-02-14 NOTE — Progress Notes (Signed)
Medications administered by student RN [phone] with supervision of Clinical Instructor Orvil FeilAbbie Seamus Warehime MSN, RN-BC or patient's assigned RN.

## 2017-02-14 NOTE — Progress Notes (Signed)
  Speech Language Pathology Treatment: Dysphagia  Patient Details Name: [name] MRN: [id number] DOB: 01/09/1966 Today's Date: 02/14/2017 Time: [id number] SLP Time Calculation (min) (ACUTE ONLY): 10 min  Assessment / Plan / Recommendation Clinical Impression  Time limited session due to pt requiring flexiseal care and visit with case manager.  Pt's father present and both he and pt re-educated to findings of MBS, purpose of compensation strategies and importance of respiratory muscle strength training.  Pt's phonatory strength continues to improve based on clinical observation. Pt has made excellent progress with swallowing and po intake during this hospital course and independently with delay stated precautions.  Pt benefits from encouragement to participate in therapy.  Of note, today he admits to h/o dysphagia x 1 year resulting in sensation of po sticking in throat, causing him to belch and then it would clear -  He denies significant reflux and states he occasionally used Tums.  Again suspect chronic deficits with significant exacerbation from acute illness.   PHPI [name] with weakness, decreased appetite and respiratory difficulites.  Pt required intubation from 12/19-12/27.  PMH + for HONK, pancreatitis, sleep apnea,   Swallow evaluation ordered. Pt underwent MBS on Monday Dec 31st and palliative meeting conducted.  Pt desire is to get well and go home.  Repeat MBS indicated to allow instrumental evaluation prior to dietary advancement.  Pt has been on a po diet and tolerating.        SLP Plan  Discharge SLP treatment due to (comment)(dc hospital today)       Recommendations  Liquids provided via: Straw Medication Administration: Whole meds with puree Supervision: Patient able to self feed Compensations: Slow rate;Small sips/bites;Multiple dry swallows after each bite/sip;Other (Comment);Chin tuck(cough, "hock" and expectorate, chin tuck with solids, head neutral  with liquids) Postural Changes and/or Swallow Maneuvers: Seated upright 90 degrees;Upright 30-60 min after meal(as much as able with coccyx wound)                Oral Care Recommendations: Oral care QID Follow up Recommendations: (tbd) SLP Visit Diagnosis: Dysphagia, pharyngeal phase (R13.13);Dysphagia, pharyngoesophageal phase (R13.14) Plan: Discharge SLP treatment due to (comment)(dc hospital today)       GO               Donavan Burnetamara Gethsemane Fischler, MS [facility] SLP [phone]  [name], Kishaun Erekson [name] 02/14/2017, 12:50 PM

## 2017-02-14 NOTE — Progress Notes (Signed)
No charge note:   Noted patient progressing. Plan for D/C to SNF for rehab. GOC are clear. Palliative medicine will sign off. Please reconsult if further assistance is desired.   Thank you for allowing Palliative Medicine to assist in the care of this patient.   Ocie BobKasie Dorrian Doggett, AGNP-C Palliative Medicine  Please call Palliative Medicine team phone with any questions [phone]. For individual providers please see AMION.

## 2017-02-14 NOTE — Clinical Social Work Placement (Signed)
[facility] and [facility].Facility aware of patient's discharge and confirmed bed offer. SNF reported patient received insurance authorization. PTAR contacted, patient's family notified. Patient's RN can call report to [phone] Room 138, packet complete. CSW signing off, no other needs identified at this time.  CLINICAL SOCIAL WORK PLACEMENT  NOTE  Date:  02/14/2017  Patient Details  Name: Gregory MasonKenneth D Butterbaugh MRN: [id number] Date of Birth: 06/10/1965  Clinical Social Work is seeking post-discharge placement for this patient at the Skilled  Nursing Facility level of care (*CSW will initial, date and re-position this form in  chart as items are completed):  Yes   Patient/family provided with [place] Clinical Social Work Department's list of facilities offering this level of care within the geographic area requested by the patient (or if unable, by the patient's family).  Yes   Patient/family informed of their freedom to choose among providers that offer the needed level of care, that participate in Medicare, Medicaid or managed care program needed by the patient, have an available bed and are willing to accept the patient.  Yes   Patient/family informed of [facility]'s ownership interest in [facility] and [facility], as well as of the fact that they are under no obligation to receive care at these facilities.  PASRR submitted to EDS on 02/09/17     PASRR number received on 02/09/17     Existing PASRR number confirmed on       FL2 transmitted to all facilities in geographic area requested by pt/family on 02/09/17     FL2 transmitted to all facilities within larger geographic area on       Patient informed that his/her managed care company has contracts with or will negotiate with certain facilities, including the following:        Yes   Patient/family informed of bed offers received.  Patient [facility] bed [facility]     Physician recommends and patient chooses bed at      [facility] on 02/14/17.  Patient to be transferred to facility by PTAR     Patient family notified on 02/14/17 of transfer.  Name of family member notified:  Reynolds Bowleborah Zenor     PHYSICIAN       Additional Comment:    _______________________________________________ Antionette PolesKimberly L [facility] Garland, LCSW 02/14/2017, 4:13 PM

## 2017-02-14 NOTE — Progress Notes (Signed)
Report given to Sharl MaMarty, RN at facility.

## 2017-02-14 NOTE — Progress Notes (Signed)
Nutrition Follow-up  DOCUMENTATION CODES:   Non-severe (moderate) malnutrition in context of chronic illness  INTERVENTION:   30 ml Prostat BID, each supplement provides 100 kcals and 15 grams protein.   NUTRITION DIAGNOSIS:   Moderate Malnutrition related to chronic illness(DM) as evidenced by moderate muscle depletion, mild muscle depletion, mild fat depletion.  Ongoing  GOAL:   Patient will meet greater than or equal to 90% of their needs  Not meeting  MONITOR:   Diet advancement, Weight trends, Labs, Skin  REASON FOR ASSESSMENT:   Consult Enteral/tube feeding initiation and management  ASSESSMENT:   52 year old man with a history of obesity, diabetes, hypertension, hyperlipidemia, obstructive sleep apnea, atrial fib/flutter with a history of prior cardioversion.  He called his son on the date of admission because he was feeling weak and unwell.  He reportedly had not been eating or drinking for the last several days prior to this.  He was brought by EMS to ED and was noted to be hypoglycemic, encephalopathic, hypotensive.  Insulin and IV fluids were initiated, but he remained hypotensive and altered.  Chest x-ray showed a right lower lobe infiltrate and he was started on antibiotics for a community-acquired versus aspiration pneumonia.  He continued to experience an overall decline and was intubated for airway protection in the ED.    Pt discussed in rounds. MD concerned boost breeze is making diarrhea worse. RD to discontinue. Blood sugars look to be increasing likely related to increase in PO intake. Pt consuming 20-25% of his last eight meals. Pt had eggs with peaches this morning but could only finish ~30% of them. Intake remains inadequate. Pt denies issues with swallowing. Admits he cannot sit straight up in bed related to pain.   Weight noted to be trending down 24 lb since last RD visit. Pt noted to be -16557 ml fluid balance. Plan to d/c to SNF today once diarrhea  symptoms have resolved. MD has ordered Imodium.   Medications reviewed and include: SSI, Lantus Labs reviewed: Na 134 (L) CBG 355-390 (H)   Diet Order:  DIET DYS 3 Room service appropriate? Yes; Fluid consistency: Thin  EDUCATION NEEDS:   No education needs have been identified at this time  Skin:  Skin Assessment: Skin Integrity Issues: Skin Integrity Issues:: Stage II, Unstageable Stage II: R ear Unstageable: full thickness to coccyx  Last BM:  02/14/17  Height:   Ht Readings from Last 1 Encounters:  01/23/17 5\' 9"  (1.753 m)    Weight:   Wt Readings from Last 1 Encounters:  02/14/17 142 lb 12.8 oz (64.8 kg)    Ideal Body Weight:  72.73 kg  BMI:  Body mass index is 21.09 kg/m.  Estimated Nutritional Needs:   Kcal:  2150-2350 kcal (27-29 kcal/kg)  Protein:  105-115 grams (1.3-1.4 g/kg)  Fluid:  >2.1 L/day    Vanessa Kickarly Sina Sumpter RD, LDN Clinical Nutrition Pager # - [phone]

## 2017-10-11 ENCOUNTER — Encounter (INDEPENDENT_AMBULATORY_CARE_PROVIDER_SITE_OTHER): Payer: BC Managed Care – PPO | Admitting: Ophthalmology

## 2017-10-11 DIAGNOSIS — E11311 Type 2 diabetes mellitus with unspecified diabetic retinopathy with macular edema: Secondary | ICD-10-CM | POA: Diagnosis not present

## 2017-10-11 DIAGNOSIS — H43811 Vitreous degeneration, right eye: Secondary | ICD-10-CM

## 2017-10-11 DIAGNOSIS — I10 Essential (primary) hypertension: Secondary | ICD-10-CM

## 2017-10-11 DIAGNOSIS — E113513 Type 2 diabetes mellitus with proliferative diabetic retinopathy with macular edema, bilateral: Secondary | ICD-10-CM | POA: Diagnosis not present

## 2017-10-11 DIAGNOSIS — H35033 Hypertensive retinopathy, bilateral: Secondary | ICD-10-CM

## 2017-11-22 ENCOUNTER — Encounter (INDEPENDENT_AMBULATORY_CARE_PROVIDER_SITE_OTHER): Payer: BC Managed Care – PPO | Admitting: Ophthalmology

## 2017-11-22 DIAGNOSIS — I10 Essential (primary) hypertension: Secondary | ICD-10-CM

## 2017-11-22 DIAGNOSIS — H35033 Hypertensive retinopathy, bilateral: Secondary | ICD-10-CM | POA: Diagnosis not present

## 2017-11-22 DIAGNOSIS — E103513 Type 1 diabetes mellitus with proliferative diabetic retinopathy with macular edema, bilateral: Secondary | ICD-10-CM | POA: Diagnosis not present

## 2017-11-22 DIAGNOSIS — E10311 Type 1 diabetes mellitus with unspecified diabetic retinopathy with macular edema: Secondary | ICD-10-CM

## 2017-11-22 DIAGNOSIS — H43811 Vitreous degeneration, right eye: Secondary | ICD-10-CM

## 2017-12-20 ENCOUNTER — Encounter (INDEPENDENT_AMBULATORY_CARE_PROVIDER_SITE_OTHER): Payer: BC Managed Care – PPO | Admitting: Ophthalmology

## 2017-12-20 DIAGNOSIS — H43811 Vitreous degeneration, right eye: Secondary | ICD-10-CM

## 2017-12-20 DIAGNOSIS — I10 Essential (primary) hypertension: Secondary | ICD-10-CM

## 2017-12-20 DIAGNOSIS — E10311 Type 1 diabetes mellitus with unspecified diabetic retinopathy with macular edema: Secondary | ICD-10-CM | POA: Diagnosis not present

## 2017-12-20 DIAGNOSIS — E103513 Type 1 diabetes mellitus with proliferative diabetic retinopathy with macular edema, bilateral: Secondary | ICD-10-CM

## 2017-12-20 DIAGNOSIS — H35033 Hypertensive retinopathy, bilateral: Secondary | ICD-10-CM

## 2018-01-17 ENCOUNTER — Encounter (INDEPENDENT_AMBULATORY_CARE_PROVIDER_SITE_OTHER): Payer: BC Managed Care – PPO | Admitting: Ophthalmology

## 2018-01-17 DIAGNOSIS — I10 Essential (primary) hypertension: Secondary | ICD-10-CM | POA: Diagnosis not present

## 2018-01-17 DIAGNOSIS — E113513 Type 2 diabetes mellitus with proliferative diabetic retinopathy with macular edema, bilateral: Secondary | ICD-10-CM

## 2018-01-17 DIAGNOSIS — H35033 Hypertensive retinopathy, bilateral: Secondary | ICD-10-CM

## 2018-01-17 DIAGNOSIS — E11311 Type 2 diabetes mellitus with unspecified diabetic retinopathy with macular edema: Secondary | ICD-10-CM

## 2018-01-17 DIAGNOSIS — H43813 Vitreous degeneration, bilateral: Secondary | ICD-10-CM

## 2018-02-14 ENCOUNTER — Encounter (INDEPENDENT_AMBULATORY_CARE_PROVIDER_SITE_OTHER): Payer: BC Managed Care – PPO | Admitting: Ophthalmology

## 2018-02-14 DIAGNOSIS — H43813 Vitreous degeneration, bilateral: Secondary | ICD-10-CM | POA: Diagnosis not present

## 2018-02-14 DIAGNOSIS — H35033 Hypertensive retinopathy, bilateral: Secondary | ICD-10-CM

## 2018-02-14 DIAGNOSIS — E103513 Type 1 diabetes mellitus with proliferative diabetic retinopathy with macular edema, bilateral: Secondary | ICD-10-CM

## 2018-02-14 DIAGNOSIS — I10 Essential (primary) hypertension: Secondary | ICD-10-CM

## 2018-02-14 DIAGNOSIS — E10311 Type 1 diabetes mellitus with unspecified diabetic retinopathy with macular edema: Secondary | ICD-10-CM | POA: Diagnosis not present

## 2018-03-15 ENCOUNTER — Encounter (INDEPENDENT_AMBULATORY_CARE_PROVIDER_SITE_OTHER): Payer: BC Managed Care – PPO | Admitting: Ophthalmology

## 2018-03-15 DIAGNOSIS — H35033 Hypertensive retinopathy, bilateral: Secondary | ICD-10-CM

## 2018-03-15 DIAGNOSIS — E11311 Type 2 diabetes mellitus with unspecified diabetic retinopathy with macular edema: Secondary | ICD-10-CM

## 2018-03-15 DIAGNOSIS — E113513 Type 2 diabetes mellitus with proliferative diabetic retinopathy with macular edema, bilateral: Secondary | ICD-10-CM

## 2018-03-15 DIAGNOSIS — I10 Essential (primary) hypertension: Secondary | ICD-10-CM | POA: Diagnosis not present

## 2018-03-15 DIAGNOSIS — H43813 Vitreous degeneration, bilateral: Secondary | ICD-10-CM

## 2018-04-12 ENCOUNTER — Encounter (INDEPENDENT_AMBULATORY_CARE_PROVIDER_SITE_OTHER): Payer: BC Managed Care – PPO | Admitting: Ophthalmology

## 2018-04-12 ENCOUNTER — Other Ambulatory Visit: Payer: Self-pay

## 2018-04-12 DIAGNOSIS — E103513 Type 1 diabetes mellitus with proliferative diabetic retinopathy with macular edema, bilateral: Secondary | ICD-10-CM

## 2018-04-12 DIAGNOSIS — E10311 Type 1 diabetes mellitus with unspecified diabetic retinopathy with macular edema: Secondary | ICD-10-CM

## 2018-04-12 DIAGNOSIS — H35033 Hypertensive retinopathy, bilateral: Secondary | ICD-10-CM

## 2018-04-12 DIAGNOSIS — I10 Essential (primary) hypertension: Secondary | ICD-10-CM | POA: Diagnosis not present

## 2018-04-12 DIAGNOSIS — H43813 Vitreous degeneration, bilateral: Secondary | ICD-10-CM

## 2018-05-24 ENCOUNTER — Other Ambulatory Visit: Payer: Self-pay

## 2018-05-24 ENCOUNTER — Encounter (INDEPENDENT_AMBULATORY_CARE_PROVIDER_SITE_OTHER): Payer: BC Managed Care – PPO | Admitting: Ophthalmology

## 2018-05-24 DIAGNOSIS — H35033 Hypertensive retinopathy, bilateral: Secondary | ICD-10-CM

## 2018-05-24 DIAGNOSIS — E11311 Type 2 diabetes mellitus with unspecified diabetic retinopathy with macular edema: Secondary | ICD-10-CM

## 2018-05-24 DIAGNOSIS — E113513 Type 2 diabetes mellitus with proliferative diabetic retinopathy with macular edema, bilateral: Secondary | ICD-10-CM | POA: Diagnosis not present

## 2018-05-24 DIAGNOSIS — H43811 Vitreous degeneration, right eye: Secondary | ICD-10-CM

## 2018-05-24 DIAGNOSIS — I10 Essential (primary) hypertension: Secondary | ICD-10-CM | POA: Diagnosis not present

## 2018-06-21 ENCOUNTER — Other Ambulatory Visit: Payer: Self-pay

## 2018-06-21 ENCOUNTER — Encounter (INDEPENDENT_AMBULATORY_CARE_PROVIDER_SITE_OTHER): Payer: BC Managed Care – PPO | Admitting: Ophthalmology

## 2018-06-21 DIAGNOSIS — E103513 Type 1 diabetes mellitus with proliferative diabetic retinopathy with macular edema, bilateral: Secondary | ICD-10-CM | POA: Diagnosis not present

## 2018-06-21 DIAGNOSIS — H35033 Hypertensive retinopathy, bilateral: Secondary | ICD-10-CM | POA: Diagnosis not present

## 2018-06-21 DIAGNOSIS — E10311 Type 1 diabetes mellitus with unspecified diabetic retinopathy with macular edema: Secondary | ICD-10-CM | POA: Diagnosis not present

## 2018-06-21 DIAGNOSIS — I10 Essential (primary) hypertension: Secondary | ICD-10-CM | POA: Diagnosis not present

## 2018-06-21 DIAGNOSIS — H43813 Vitreous degeneration, bilateral: Secondary | ICD-10-CM

## 2018-07-19 ENCOUNTER — Encounter (INDEPENDENT_AMBULATORY_CARE_PROVIDER_SITE_OTHER): Payer: BC Managed Care – PPO | Admitting: Ophthalmology

## 2019-03-04 DIAGNOSIS — R040 Epistaxis: Secondary | ICD-10-CM

## 2019-03-04 HISTORY — DX: Epistaxis: R04.0

## 2019-03-12 ENCOUNTER — Inpatient Hospital Stay (HOSPITAL_COMMUNITY)
Admission: EM | Admit: 2019-03-12 | Discharge: 2019-03-14 | DRG: 683 | Disposition: A | Discharge status: Home / Self Care | Payer: BC Managed Care – PPO | Attending: Student in an Organized Health Care Education/Training Program | Admitting: Student in an Organized Health Care Education/Training Program

## 2019-03-12 ENCOUNTER — Observation Stay (HOSPITAL_COMMUNITY): Payer: BC Managed Care – PPO

## 2019-03-12 DIAGNOSIS — R040 Epistaxis: Secondary | ICD-10-CM | POA: Diagnosis not present

## 2019-03-12 DIAGNOSIS — E7800 Pure hypercholesterolemia, unspecified: Secondary | ICD-10-CM | POA: Diagnosis present

## 2019-03-12 DIAGNOSIS — N183 Chronic kidney disease, stage 3 unspecified: Secondary | ICD-10-CM | POA: Diagnosis present

## 2019-03-12 DIAGNOSIS — E875 Hyperkalemia: Secondary | ICD-10-CM | POA: Diagnosis present

## 2019-03-12 DIAGNOSIS — Z6836 Body mass index (BMI) 36.0-36.9, adult: Secondary | ICD-10-CM

## 2019-03-12 DIAGNOSIS — Z8051 Family history of malignant neoplasm of kidney: Secondary | ICD-10-CM

## 2019-03-12 DIAGNOSIS — Z87891 Personal history of nicotine dependence: Secondary | ICD-10-CM

## 2019-03-12 DIAGNOSIS — Z794 Long term (current) use of insulin: Secondary | ICD-10-CM

## 2019-03-12 DIAGNOSIS — N179 Acute kidney failure, unspecified: Secondary | ICD-10-CM | POA: Diagnosis not present

## 2019-03-12 DIAGNOSIS — I480 Paroxysmal atrial fibrillation: Secondary | ICD-10-CM

## 2019-03-12 DIAGNOSIS — D62 Acute posthemorrhagic anemia: Secondary | ICD-10-CM

## 2019-03-12 DIAGNOSIS — Z833 Family history of diabetes mellitus: Secondary | ICD-10-CM

## 2019-03-12 DIAGNOSIS — R809 Proteinuria, unspecified: Secondary | ICD-10-CM | POA: Diagnosis present

## 2019-03-12 DIAGNOSIS — D631 Anemia in chronic kidney disease: Secondary | ICD-10-CM | POA: Diagnosis present

## 2019-03-12 DIAGNOSIS — Z20822 Contact with and (suspected) exposure to covid-19: Secondary | ICD-10-CM | POA: Diagnosis present

## 2019-03-12 DIAGNOSIS — G4730 Sleep apnea, unspecified: Secondary | ICD-10-CM | POA: Diagnosis present

## 2019-03-12 DIAGNOSIS — I4892 Unspecified atrial flutter: Secondary | ICD-10-CM | POA: Diagnosis present

## 2019-03-12 DIAGNOSIS — E1122 Type 2 diabetes mellitus with diabetic chronic kidney disease: Secondary | ICD-10-CM | POA: Diagnosis not present

## 2019-03-12 DIAGNOSIS — D691 Qualitative platelet defects: Secondary | ICD-10-CM | POA: Diagnosis present

## 2019-03-12 DIAGNOSIS — E46 Unspecified protein-calorie malnutrition: Secondary | ICD-10-CM | POA: Diagnosis present

## 2019-03-12 DIAGNOSIS — E669 Obesity, unspecified: Secondary | ICD-10-CM | POA: Diagnosis present

## 2019-03-12 DIAGNOSIS — T45515A Adverse effect of anticoagulants, initial encounter: Secondary | ICD-10-CM | POA: Diagnosis present

## 2019-03-12 DIAGNOSIS — Z820 Family history of epilepsy and other diseases of the nervous system: Secondary | ICD-10-CM

## 2019-03-12 DIAGNOSIS — I70229 Atherosclerosis of native arteries of extremities with rest pain, unspecified extremity: Secondary | ICD-10-CM | POA: Diagnosis present

## 2019-03-12 DIAGNOSIS — I129 Hypertensive chronic kidney disease with stage 1 through stage 4 chronic kidney disease, or unspecified chronic kidney disease: Secondary | ICD-10-CM | POA: Diagnosis not present

## 2019-03-12 DIAGNOSIS — R339 Retention of urine, unspecified: Secondary | ICD-10-CM | POA: Diagnosis present

## 2019-03-12 DIAGNOSIS — I4510 Unspecified right bundle-branch block: Secondary | ICD-10-CM | POA: Diagnosis present

## 2019-03-12 DIAGNOSIS — I959 Hypotension, unspecified: Secondary | ICD-10-CM | POA: Diagnosis present

## 2019-03-12 DIAGNOSIS — E781 Pure hyperglyceridemia: Secondary | ICD-10-CM | POA: Diagnosis present

## 2019-03-12 DIAGNOSIS — Z8249 Family history of ischemic heart disease and other diseases of the circulatory system: Secondary | ICD-10-CM

## 2019-03-12 DIAGNOSIS — Z7982 Long term (current) use of aspirin: Secondary | ICD-10-CM

## 2019-03-12 DIAGNOSIS — E1140 Type 2 diabetes mellitus with diabetic neuropathy, unspecified: Secondary | ICD-10-CM

## 2019-03-12 DIAGNOSIS — E1165 Type 2 diabetes mellitus with hyperglycemia: Secondary | ICD-10-CM | POA: Diagnosis not present

## 2019-03-12 DIAGNOSIS — Z7901 Long term (current) use of anticoagulants: Secondary | ICD-10-CM

## 2019-03-12 DIAGNOSIS — D6832 Hemorrhagic disorder due to extrinsic circulating anticoagulants: Secondary | ICD-10-CM | POA: Diagnosis present

## 2019-03-12 DIAGNOSIS — I998 Other disorder of circulatory system: Secondary | ICD-10-CM | POA: Diagnosis present

## 2019-03-12 DIAGNOSIS — E11319 Type 2 diabetes mellitus with unspecified diabetic retinopathy without macular edema: Secondary | ICD-10-CM

## 2019-03-12 LAB — CBC
HCT: 27.8 % — ABNORMAL LOW (ref 39.0–52.0)
HCT: 26.9 % — ABNORMAL LOW (ref 39.0–52.0)
HCT: 27.3 % — ABNORMAL LOW (ref 39.0–52.0)
Hemoglobin: 9 g/dL — ABNORMAL LOW (ref 13.0–17.0)
Hemoglobin: 8.7 g/dL — ABNORMAL LOW (ref 13.0–17.0)
Hemoglobin: 8.4 g/dL — ABNORMAL LOW (ref 13.0–17.0)
MCH: 27.7 pg (ref 26.0–34.0)
MCH: 27.3 pg (ref 26.0–34.0)
MCH: 27 pg (ref 26.0–34.0)
MCHC: 32.4 g/dL (ref 30.0–36.0)
MCHC: 32.3 g/dL (ref 30.0–36.0)
MCHC: 30.8 g/dL (ref 30.0–36.0)
MCV: 85.5 fL (ref 80.0–100.0)
MCV: 84.3 fL (ref 80.0–100.0)
MCV: 87.8 fL (ref 80.0–100.0)
Platelets: 293 10*3/uL (ref 150–400)
Platelets: 273 10*3/uL (ref 150–400)
Platelets: 278 10*3/uL (ref 150–400)
RBC: 3.25 MIL/uL — ABNORMAL LOW (ref 4.22–5.81)
RBC: 3.19 MIL/uL — ABNORMAL LOW (ref 4.22–5.81)
RBC: 3.11 MIL/uL — ABNORMAL LOW (ref 4.22–5.81)
RDW: 14.1 % (ref 11.5–15.5)
RDW: 14.1 % (ref 11.5–15.5)
RDW: 14.3 % (ref 11.5–15.5)
WBC: 14.3 10*3/uL — ABNORMAL HIGH (ref 4.0–10.5)
WBC: 11.1 10*3/uL — ABNORMAL HIGH (ref 4.0–10.5)
WBC: 13.2 10*3/uL — ABNORMAL HIGH (ref 4.0–10.5)
nRBC: 0 % (ref 0.0–0.2)
nRBC: 0 % (ref 0.0–0.2)
nRBC: 0 % (ref 0.0–0.2)

## 2019-03-12 LAB — HEPATIC FUNCTION PANEL
ALT: 15 U/L (ref 0–44)
AST: 16 U/L (ref 15–41)
Albumin: 2 g/dL — ABNORMAL LOW (ref 3.5–5.0)
Alkaline Phosphatase: 51 U/L (ref 38–126)
Bilirubin, Direct: 0.1 mg/dL (ref 0.0–0.2)
Indirect Bilirubin: 0.8 mg/dL (ref 0.3–0.9)
Total Bilirubin: 0.9 mg/dL (ref 0.3–1.2)
Total Protein: 5.3 g/dL — ABNORMAL LOW (ref 6.5–8.1)

## 2019-03-12 LAB — BASIC METABOLIC PANEL
Anion gap: 11 (ref 5–15)
Anion gap: 9 (ref 5–15)
Anion gap: 8 (ref 5–15)
BUN: 69 mg/dL — ABNORMAL HIGH (ref 6–20)
BUN: 70 mg/dL — ABNORMAL HIGH (ref 6–20)
BUN: 70 mg/dL — ABNORMAL HIGH (ref 6–20)
CO2: 20 mmol/L — ABNORMAL LOW (ref 22–32)
CO2: 18 mmol/L — ABNORMAL LOW (ref 22–32)
CO2: 19 mmol/L — ABNORMAL LOW (ref 22–32)
Calcium: 8.2 mg/dL — ABNORMAL LOW (ref 8.9–10.3)
Calcium: 8.4 mg/dL — ABNORMAL LOW (ref 8.9–10.3)
Calcium: 8.3 mg/dL — ABNORMAL LOW (ref 8.9–10.3)
Chloride: 101 mmol/L (ref 98–111)
Chloride: 108 mmol/L (ref 98–111)
Chloride: 105 mmol/L (ref 98–111)
Creatinine, Ser: 2.72 mg/dL — ABNORMAL HIGH (ref 0.61–1.24)
Creatinine, Ser: 2.6 mg/dL — ABNORMAL HIGH (ref 0.61–1.24)
Creatinine, Ser: 2.44 mg/dL — ABNORMAL HIGH (ref 0.61–1.24)
GFR calc Af Amer: 29 mL/min — ABNORMAL LOW (ref >60)
GFR calc Af Amer: 31 mL/min — ABNORMAL LOW (ref >60)
GFR calc Af Amer: 33 mL/min — ABNORMAL LOW (ref >60)
GFR calc non Af Amer: 25 mL/min — ABNORMAL LOW (ref >60)
GFR calc non Af Amer: 27 mL/min — ABNORMAL LOW (ref >60)
GFR calc non Af Amer: 29 mL/min — ABNORMAL LOW (ref >60)
Glucose, Bld: 371 mg/dL — ABNORMAL HIGH (ref 70–99)
Glucose, Bld: 341 mg/dL — ABNORMAL HIGH (ref 70–99)
Glucose, Bld: 346 mg/dL — ABNORMAL HIGH (ref 70–99)
Potassium: 6.9 mmol/L (ref 3.5–5.1)
Potassium: 6 mmol/L — ABNORMAL HIGH (ref 3.5–5.1)
Potassium: 5.2 mmol/L — ABNORMAL HIGH (ref 3.5–5.1)
Sodium: 132 mmol/L — ABNORMAL LOW (ref 135–145)
Sodium: 135 mmol/L (ref 135–145)
Sodium: 132 mmol/L — ABNORMAL LOW (ref 135–145)

## 2019-03-12 LAB — URINALYSIS, ROUTINE W REFLEX MICROSCOPIC
Bilirubin Urine: NEGATIVE
Glucose, UA: 150 mg/dL — AB
Hgb urine dipstick: NEGATIVE
Ketones, ur: NEGATIVE mg/dL
Leukocytes,Ua: NEGATIVE
Nitrite: NEGATIVE
Protein, ur: 100 mg/dL — AB
Specific Gravity, Urine: 1.013 (ref 1.005–1.030)
pH: 6 (ref 5.0–8.0)

## 2019-03-12 LAB — IRON AND TIBC
Iron: 58 ug/dL (ref 45–182)
Saturation Ratios: 25 % (ref 17.9–39.5)
TIBC: 232 ug/dL — ABNORMAL LOW (ref 250–450)
UIBC: 174 ug/dL

## 2019-03-12 LAB — PREPARE RBC (CROSSMATCH)

## 2019-03-12 LAB — RESPIRATORY PANEL BY RT PCR (FLU A&B, COVID)
Influenza A by PCR: NEGATIVE
Influenza B by PCR: NEGATIVE
SARS Coronavirus 2 by RT PCR: NEGATIVE

## 2019-03-12 LAB — PROTEIN / CREATININE RATIO, URINE
Creatinine, Urine: 41.2 mg/dL
Protein Creatinine Ratio: 11.09 mg/mg{Cre} — ABNORMAL HIGH (ref 0.00–0.15)
Total Protein, Urine: 457 mg/dL

## 2019-03-12 LAB — RETICULOCYTES
Immature Retic Fract: 25.5 % — ABNORMAL HIGH (ref 2.3–15.9)
RBC.: 3.11 MIL/uL — ABNORMAL LOW (ref 4.22–5.81)
Retic Count, Absolute: 60.3 10*3/uL (ref 19.0–186.0)
Retic Ct Pct: 1.9 % (ref 0.4–3.1)

## 2019-03-12 LAB — FERRITIN: Ferritin: 172 ng/mL (ref 24–336)

## 2019-03-12 LAB — GLUCOSE, CAPILLARY
Glucose-Capillary: 304 mg/dL — ABNORMAL HIGH (ref 70–99)
Glucose-Capillary: 325 mg/dL — ABNORMAL HIGH (ref 70–99)

## 2019-03-12 LAB — CREATININE, URINE, RANDOM: Creatinine, Urine: 40.74 mg/dL

## 2019-03-12 LAB — HIV ANTIBODY (ROUTINE TESTING W REFLEX): HIV Screen 4th Generation wRfx: NONREACTIVE

## 2019-03-12 LAB — ABO/RH: ABO/RH(D): O NEG

## 2019-03-12 LAB — SODIUM, URINE, RANDOM: Sodium, Ur: 72 mmol/L

## 2019-03-12 LAB — CBG MONITORING, ED: Glucose-Capillary: 245 mg/dL — ABNORMAL HIGH (ref 70–99)

## 2019-03-12 LAB — HEMOGLOBIN AND HEMATOCRIT, BLOOD
HCT: 26.6 % — ABNORMAL LOW (ref 39.0–52.0)
Hemoglobin: 8.9 g/dL — ABNORMAL LOW (ref 13.0–17.0)

## 2019-03-12 MED ORDER — SODIUM CHLORIDE 0.9% IV SOLUTION: Freq: Once | INTRAVENOUS | Status: AC

## 2019-03-12 MED ORDER — INSULIN ASPART 100 UNIT/ML ~~LOC~~ SOLN
0.0000 [IU] | Freq: Three times a day (TID) | SUBCUTANEOUS | Status: DC
  Administered 2019-03-12 – 2019-03-13 (×3): 11 [IU] via SUBCUTANEOUS
  Administered 2019-03-13: 07:00:00 8 [IU] via SUBCUTANEOUS
  Administered 2019-03-14: 06:00:00 3 [IU] via SUBCUTANEOUS
  Administered 2019-03-14: 12:00:00 11 [IU] via SUBCUTANEOUS

## 2019-03-12 MED ORDER — ALBUTEROL SULFATE HFA 108 (90 BASE) MCG/ACT IN AERS
4.0000 | INHALATION_SPRAY | Freq: Once | RESPIRATORY_TRACT | Status: DC
  Filled 2019-03-12: qty 6.7

## 2019-03-12 MED ORDER — CEPHALEXIN 500 MG PO CAPS: 500.0000 mg | ORAL_CAPSULE | Freq: Two times a day (BID) | ORAL | 0 refills | Status: AC

## 2019-03-12 MED ORDER — CHLORHEXIDINE GLUCONATE CLOTH 2 % EX PADS
6.0000 | MEDICATED_PAD | Freq: Every day | CUTANEOUS | Status: DC
  Administered 2019-03-13 – 2019-03-14 (×2): 6 via TOPICAL

## 2019-03-12 MED ORDER — ENOXAPARIN SODIUM 30 MG/0.3ML ~~LOC~~ SOLN
30.0000 mg | SUBCUTANEOUS | Status: DC
  Administered 2019-03-13: 13:00:00 30 mg via SUBCUTANEOUS
  Filled 2019-03-12: qty 0.3

## 2019-03-12 MED ORDER — INSULIN ASPART 100 UNIT/ML IV SOLN
5.0000 [IU] | Freq: Once | INTRAVENOUS | Status: AC
  Administered 2019-03-12: 12:00:00 5 [IU] via INTRAVENOUS

## 2019-03-12 MED ORDER — TAMSULOSIN HCL 0.4 MG PO CAPS
0.4000 mg | ORAL_CAPSULE | Freq: Every day | ORAL | Status: DC
  Administered 2019-03-13 – 2019-03-14 (×2): 0.4 mg via ORAL
  Filled 2019-03-12 (×3): qty 1

## 2019-03-12 MED ORDER — INSULIN ASPART 100 UNIT/ML ~~LOC~~ SOLN
0.0000 [IU] | Freq: Every day | SUBCUTANEOUS | Status: DC
  Administered 2019-03-12: 4 [IU] via SUBCUTANEOUS

## 2019-03-12 MED ORDER — FERROUS SULFATE 325 (65 FE) MG PO TABS
325.0000 mg | ORAL_TABLET | Freq: Two times a day (BID) | ORAL | Status: DC
  Administered 2019-03-12 – 2019-03-14 (×4): 325 mg via ORAL
  Filled 2019-03-12 (×4): qty 1

## 2019-03-12 MED ORDER — ACETAMINOPHEN 650 MG RE SUPP: 650.0000 mg | Freq: Four times a day (QID) | RECTAL | Status: DC | PRN

## 2019-03-12 MED ORDER — HYDRALAZINE HCL 10 MG PO TABS
10.0000 mg | ORAL_TABLET | Freq: Four times a day (QID) | ORAL | Status: DC
  Administered 2019-03-12 – 2019-03-13 (×5): 10 mg via ORAL
  Filled 2019-03-12 (×5): qty 1

## 2019-03-12 MED ORDER — CALCIUM GLUCONATE-NACL 1-0.675 GM/50ML-% IV SOLN
1.0000 g | Freq: Once | INTRAVENOUS | Status: AC
  Administered 2019-03-12: 12:00:00 1000 mg via INTRAVENOUS
  Filled 2019-03-12: qty 50

## 2019-03-12 MED ORDER — INSULIN DETEMIR 100 UNIT/ML ~~LOC~~ SOLN
20.0000 [IU] | Freq: Every day | SUBCUTANEOUS | Status: DC
  Administered 2019-03-12: 20 [IU] via SUBCUTANEOUS
  Filled 2019-03-12: qty 0.2

## 2019-03-12 MED ORDER — ALBUTEROL SULFATE (2.5 MG/3ML) 0.083% IN NEBU
10.0000 mg | INHALATION_SOLUTION | Freq: Once | RESPIRATORY_TRACT | Status: DC
  Filled 2019-03-12: qty 12

## 2019-03-12 MED ORDER — ONDANSETRON HCL 4 MG PO TABS: 4.0000 mg | ORAL_TABLET | Freq: Four times a day (QID) | ORAL | Status: DC | PRN

## 2019-03-12 MED ORDER — NIFEDIPINE ER OSMOTIC RELEASE 30 MG PO TB24
30.0000 mg | ORAL_TABLET | Freq: Every day | ORAL | Status: DC
  Administered 2019-03-12: 17:00:00 30 mg via ORAL
  Filled 2019-03-12 (×2): qty 1

## 2019-03-12 MED ORDER — TRANEXAMIC ACID FOR EPISTAXIS
500.0000 mg | Freq: Once | TOPICAL | Status: DC
  Filled 2019-03-12: qty 10

## 2019-03-12 MED ORDER — LORATADINE 10 MG PO TABS
10.0000 mg | ORAL_TABLET | Freq: Every day | ORAL | Status: DC
  Administered 2019-03-12 – 2019-03-14 (×3): 10 mg via ORAL
  Filled 2019-03-12 (×4): qty 1

## 2019-03-12 MED ORDER — SODIUM ZIRCONIUM CYCLOSILICATE 10 G PO PACK
10.0000 g | PACK | Freq: Three times a day (TID) | ORAL | Status: DC
  Administered 2019-03-12 (×3): 10 g via ORAL
  Filled 2019-03-12 (×4): qty 1

## 2019-03-12 MED ORDER — SODIUM CHLORIDE 0.9 % IV BOLUS
1000.0000 mL | Freq: Once | INTRAVENOUS | Status: AC
Start: 2019-03-12 — End: 2019-03-12
  Administered 2019-03-12: 10:00:00 1000 mL via INTRAVENOUS

## 2019-03-12 MED ORDER — ACETAMINOPHEN 325 MG PO TABS: 650.0000 mg | ORAL_TABLET | Freq: Four times a day (QID) | ORAL | Status: DC | PRN

## 2019-03-12 MED ORDER — ONDANSETRON HCL 4 MG/2ML IJ SOLN: 4.0000 mg | Freq: Four times a day (QID) | INTRAMUSCULAR | Status: DC | PRN

## 2019-03-12 MED ORDER — LACTATED RINGERS IV BOLUS
1000.0000 mL | Freq: Once | INTRAVENOUS | Status: AC
  Administered 2019-03-12: 12:00:00 1000 mL via INTRAVENOUS

## 2019-03-12 MED ORDER — OXYMETAZOLINE HCL 0.05 % NA SOLN
1.0000 | Freq: Once | NASAL | Status: AC
  Administered 2019-03-12: 07:00:00 1 via NASAL
  Filled 2019-03-12: qty 30

## 2019-03-12 NOTE — ED Notes (Addendum)
Pt was discharged and waiting in waiting room for his son to pick him up. This RN acting as Psychologist, educational- noticed pt very pale, diaphoretic and weak. Pt originally was not very lethargic when he was brought to the waiting room. Pt's son arrived, this RN spoke with son and asked him to speak with pt and assess baseline. Both of [place] agreed pt is not well enough to d/c. Spoke with charge RN and pt will go back to ED room. Pt's son, Maisie Fus, will call back within an hour to decide if he should go home or stay in [place]. This happened at 0844 am today, pt's chart was in discharged mode, so will not allow me to chart at the correct time.

## 2019-03-12 NOTE — H&P (Signed)
Date: 03/12/2019               Patient Name:  [name] MRN: [id number]  DOB: October 28, 1965 Age / Sex: 54 y.o., male   PCP: Kaleen Mask, MD         Medical Service: Internal Medicine Teaching Service         Attending Physician: Dr. Oswaldo Done, Marquita Palms, *    First Contact: Dr. Ronelle Nigh Pager: [phone]  Second Contact: Dr. Delma Officer Pager: [phone]       After Hours (After 5p/  First Contact Pager: [phone]  weekends / holidays): Second Contact Pager: [phone]   Chief Complaint: Nose bleed  History of Present Illness: [name] is a 54 y.o male with HTN, DM, and PAF on Xarelto who presented to the ED with epistaxis. History was obtained via the patient and through chart review.   Over the last one month the patient has noticed blood anytime he blows his nose. Over the last week it is gotten progressively worse. This morning around 2 AM he got up to blow his nose and noticed a significant nosebleed. He subsequently woke up his wife and was brought to the emergency department for further evaluation. He has had similar episodes in the past, two. He states that the most recent one was "several years ago." He has been on Xarelto during each episode. He denies any trauma/nose picking. He denies any hemoptysis, hematemesis, hematochezia, or dark/tarry stools. He has never had a colonoscopy or EGD before. No family history of GI bleeds.   Other than the epistaxis he has been feeling well. He states that he has been eating and drinking appropriately. He is not started any new medications. He does not use NSAIDs including ibuprofen, Motrin, Aleve, BC, goodie powder. He has not been sick within the past 1 to 2 months including respiratory illness or a new rash. He has never had a blood transfusion before. He has never used IV drugs. He does not have a family history of kidney disease. He denies urinary urgency, hesitancy, or feeling of incomplete voiding. He does have nocturia and  reports getting up three times per night to use the bathroom.  On review of systems he feels weak, short of breath, and has excessive thirst. He otherwise denies fevers, chills, sore throat, chest pain, palpitations, abdominal pain, nausea/vomiting, diarrhea, hematuria, myalgias, arthralgias.  Meds:  Current Meds  Medication Sig  . Ascorbic Acid (VITAMIN C) 1000 MG tablet Take 500 mg by mouth daily.  Marland Kitchen aspirin EC 81 MG tablet Take 81 mg by mouth daily.  . Ferrous Sulfate (IRON) 325 (65 Fe) MG TABS Take 65 mg by mouth 2 (two) times daily.  Marland Kitchen glimepiride (AMARYL) 4 MG tablet Take 2 mg by mouth daily with breakfast.  . insulin aspart (NOVOLOG) 100 UNIT/ML injection Inject 0-15 Units into the skin 3 (three) times daily with meals.  Marland Kitchen LEVEMIR 100 UNIT/ML injection Inject 30 Units into the skin at bedtime.  Marland Kitchen loratadine (CLARITIN) 10 MG tablet Take 10 mg by mouth daily.  . metFORMIN (GLUCOPHAGE) 1000 MG tablet Take 1,000 mg by mouth 2 (two) times daily.  . Multiple Vitamin (MULTIVITAMIN WITH MINERALS) TABS tablet Take 1 tablet by mouth daily.  . niacin 500 MG tablet Take 500 mg by mouth 2 (two) times daily with a meal.  . omeprazole (PRILOSEC) 20 MG capsule Take 20 mg by mouth daily.  . tamsulosin (FLOMAX) 0.4 MG CAPS capsule Take 0.4  mg by mouth daily.  Alveda Reasons 20 MG TABS tablet Take 20 mg by mouth daily.   Allergies: Allergies as of 03/12/2019  . (No Known Allergies)   Past Medical History:  Diagnosis Date  . Atrial fib/flutter, transient 09/19/2011  . Elevated cholesterol with elevated triglycerides   . Hypertension   . Kidney stones   . Pancreatitis 1999  . Pneumonia ~ 2003  . Sleep apnea    nO OFFICALLY DIAGNOSIED, SNORES LOUDLY AND WIFE HAS WITNESSED APNEA WHEN HE IS SLEEPING  . Type II diabetes mellitus ([place])    Family History: Mother died in 2022/12/13 from Parkinson's disease. Father has uncontrolled diabetes, hypertension, and kidney disease. Grandmother lost a kidney due to  renal cell carcinoma.  Social History: previously worked in [employer] however lost his company in 2018. Since that time he has been unemployed. Lives at home with his wife and his youngest son. He is one older son who is a English as a second language teacher in the [place] area. His other son is in Armed forces logistics/support/administrative officer school. He does all his own ADLs. He previously smoked and used alcohol products. He states that he is not an alcoholic beverage in over a year. He has not smoked in greater than five years. Denies current or previous use of illicit substances.  Review of Systems: A complete ROS was negative except as per HPI.   Physical Exam: Blood pressure (!) 204/110, pulse (!) 103, temperature 97.7 F (36.5 C), temperature source Oral, resp. rate (!) 23, SpO2 100 %.  General: Obese male, appears fatigued and SHOB HENT: Normocephalic, atraumatic, moist mucus membranes, pale conjunctivitis Pulm: Good air movement with no wheezing or crackles  CV: RRR, no murmurs, no rubs  Abdomen: Active bowel sounds, soft, non-distended, no tenderness to palpation  Extremities: Pulses palpable in all extremities, no LE edema  Skin: Warm and dry  Neuro: Alert and oriented x 3  EKG: personally reviewed my interpretation is sinus rhythm with right bundle branch block. Peaked T-wave's. No other interval lengthening.  Assessment & Plan by Problem: Principal Problem:   AKI (acute kidney injury) ([place]) Active Problems:   Hyperkalemia   Epistaxis  [name] is a 54 y.o male with HTN, DM, and PAF on Xarelto who presented to the ED with epistaxis. He was subsequently found to have an AKI with hyperkalemia on routine blood work. He was admitted for further evaluation/management.  Epistaxis resulting in symptomatic anemia - Epistaxis has resolved. Nasal packing in place. - Suspect that the patient's hemoglobin is falsely normal given his excessive thirst, tachycardia, shortness of breath, and fatigue. - Will type and  screen and transfused one unit. - Check CBC and follow-up post transfusion CBC. - Xarelto and aspirin  Acute kidney injury with hyperkalemia - Hyperkalemia treated acutely with insulin and albuterol - Given 1 g calcium gluconate - Started on Lokelma 10 g TID - Check renal ultrasound to evaluate for obstruction/hydronephrosis - Obtain UA to look for intrinsic renal disease. - Repeat BMP now and after 2 L bolus - Hold metformin and omeprazole  Insulin-dependent diabetes mellitus - Home medications include Glimiperide 4 mg once daily, Levemir 30 units daily, sliding scale NovoLog, and metformin 1000 mg BID. - Start on Levemir 20 units and SSI while in hospital  - Hold glimiperide and metformin   Hypertension - Does not appear to be on any prescription antihypertensive medications. - Continue to monitor blood pressures.  Paroxysmal atrial fibrillation - Currently in sinus rhythm. Does not appear to  be on any rate/rhythm control. - CHADS2VASC of 3, holding Xarelto  Diet: Carb mod VTE ppx: Renally adjusted lovenox Code status: Full Code  Dispo: Admit patient to Observation with expected length of stay less than 2 midnights.  SignedLevora Dredge, MD 03/12/2019, 12:13 PM  Pager: [phone]

## 2019-03-12 NOTE — Consult Note (Signed)
H&P Physician requesting consult: Levora Dredge  Chief Complaint: Difficult Foley catheter placement  History of Present Illness: 54 year old male presented with epistaxis.  He is on Xarelto.  He is currently receiving a blood transfusion for acute blood loss anemia.  He was also noted to have acute renal insufficiency with hyperkalemia.  Nursing attempted Foley catheter placement but was unsuccessful.  Therefore, I was consulted.  He had a renal ultrasound performed that showed no hydronephrosis.  There was some cortical thinning noted on the right.  Past Medical History:  Diagnosis Date  . Atrial fib/flutter, transient 09/19/2011  . Elevated cholesterol with elevated triglycerides   . Hypertension   . Kidney stones   . Pancreatitis 1999  . Pneumonia ~ 2003  . Sleep apnea    nO OFFICALLY DIAGNOSIED, SNORES LOUDLY AND WIFE HAS WITNESSED APNEA WHEN HE IS SLEEPING  . Type II diabetes mellitus (HCC)    Past Surgical History:  Procedure Laterality Date  . CARDIOVERSION  09/30/2011   Procedure: CARDIOVERSION;  Surgeon: Wendall Stade, MD;  Location: [facility] ENDOSCOPY;  Service: Cardiovascular;  Laterality: N/A;  . EYE SURGERY    . PARS PLANA VITRECTOMY  12/08/2011   left  . PARS PLANA VITRECTOMY  12/08/2011   Procedure: PARS PLANA VITRECTOMY WITH 25 GAUGE;  Surgeon: Sherrie George, MD;  Location[facility] OR;  Service: Ophthalmology;  Laterality: Left;  . TEE WITHOUT CARDIOVERSION  09/30/2011   Procedure: TRANSESOPHAGEAL ECHOCARDIOGRAM (TEE);  Surgeon: Wendall Stade, MD;  Location: [facility] ENDOSCOPY;  Service: Cardiovascular;  Laterality: N/A;  kristine/ebp/beverly ( or scheduling)/ time rescheduled from 1300 to 1200 talked ( mary)    Home Medications:  Medications Prior to Admission  Medication Sig Dispense Refill Last Dose  . Ascorbic Acid (VITAMIN C) 1000 MG tablet Take 500 mg by mouth daily.   03/11/2019 at Unknown time  . aspirin EC 81 MG tablet Take 81 mg by mouth daily.   03/11/2019 at Unknown time   . Ferrous Sulfate (IRON) 325 (65 Fe) MG TABS Take 65 mg by mouth 2 (two) times daily.   03/11/2019 at Unknown time  . glimepiride (AMARYL) 4 MG tablet Take 2 mg by mouth daily with breakfast.   03/11/2019 at Unknown time  . insulin aspart (NOVOLOG) 100 UNIT/ML injection Inject 0-15 Units into the skin 3 (three) times daily with meals. 10 mL 11 unk  . LEVEMIR 100 UNIT/ML injection Inject 30 Units into the skin at bedtime.   03/11/2019 at Unknown time  . loratadine (CLARITIN) 10 MG tablet Take 10 mg by mouth daily.   03/11/2019 at Unknown time  . metFORMIN (GLUCOPHAGE) 1000 MG tablet Take 1,000 mg by mouth 2 (two) times daily.   03/11/2019 at Unknown time  . Multiple Vitamin (MULTIVITAMIN WITH MINERALS) TABS tablet Take 1 tablet by mouth daily.   03/11/2019 at Unknown time  . niacin 500 MG tablet Take 500 mg by mouth 2 (two) times daily with a meal.   03/11/2019 at Unknown time  . omeprazole (PRILOSEC) 20 MG capsule Take 20 mg by mouth daily.   03/11/2019 at Unknown time  . tamsulosin (FLOMAX) 0.4 MG CAPS capsule Take 0.4 mg by mouth daily.   03/11/2019 at Unknown time  . XARELTO 20 MG TABS tablet Take 20 mg by mouth daily.   03/11/2019 at 0900  . Amino Acids-Protein Hydrolys (FEEDING SUPPLEMENT, PRO-STAT SUGAR FREE 64,) LIQD Take 30 mLs by mouth 2 (two) times daily. (Patient not taking: Reported on 03/12/2019) 900  mL 0 Not Taking at Unknown time  . cholestyramine light (PREVALITE) 4 g packet Take 1 packet (4 g total) by mouth 3 (three) times daily. (Patient not taking: Reported on 03/12/2019)   Not Taking at Unknown time  . citalopram (CELEXA) 20 MG tablet Take 1 tablet (20 mg total) by mouth daily. (Patient not taking: Reported on 03/12/2019) 30 tablet 0 Not Taking at Unknown time  . collagenase (SANTYL) ointment Apply topically daily. Apply to coccyx and ischial wounds once daily. (Patient not taking: Reported on 03/12/2019) 15 g 0 Not Taking at Unknown time  . enoxaparin (LOVENOX) 40 MG/0.4ML injection Inject 0.4 mLs (40 mg  total) into the skin daily. (Patient not taking: Reported on 03/12/2019) 0 Syringe  Not Taking at Unknown time  . Hydrocortisone (GERHARDT'S BUTT CREAM) CREA Apply 1 application topically 3 (three) times daily. Apply to all the red areas on his buttocks and thighs.  Clean with soap and water, dry before each application. (Patient not taking: Reported on 03/12/2019)   Not Taking at Unknown time  . insulin aspart (NOVOLOG) 100 UNIT/ML injection Inject 0-5 Units into the skin at bedtime. (Patient not taking: Reported on 03/12/2019) 10 mL 11 Not Taking at Unknown time  . insulin glargine (LANTUS) 100 UNIT/ML injection Inject 0.2 mLs (20 Units total) into the skin daily. (Patient not taking: Reported on 03/12/2019) 10 mL 0 Not Taking at Unknown time   Allergies: No Known Allergies  No family history on file. Social History:  reports that he has never smoked. He has never used smokeless tobacco. He reports current alcohol use. He reports that he does not use drugs.  ROS: A complete review of systems was performed.  All systems are negative except for pertinent findings as noted. ROS   Physical Exam:  Vital signs in last 24 hours: Temp:  [97.7 F (36.5 C)-98.6 F (37 C)] 98.6 F (37 C) (02/09 1609) Pulse Rate:  [43-113] 68 (02/09 1609) Resp:  [11-25] 19 (02/09 1609) BP: (91-215)/(51-110) 207/76 (02/09 1609) SpO2:  [78 %-100 %] 100 % (02/09 1609) Weight:  [95.4 kg] 95.4 kg (02/09 1542) General:  Alert and oriented, No acute distress HEENT: Normocephalic, atraumatic Neck: No JVD or lymphadenopathy Cardiovascular: Regular rate and rhythm Lungs: Regular rate and effort Abdomen: Soft, nontender, nondistended, no abdominal masses Back: No CVA tenderness Genitourinary: Circumcised but retracted/hidden penis Extremities: No edema Neurologic: Grossly intact  Laboratory Data:  Results for orders placed or performed during the hospital encounter of 03/12/19 (from the past 24 hour(s))  Type and screen  [place]     Status: None (Preliminary result)   Collection Time: 03/12/19  4:46 AM  Result Value Ref Range   ABO/RH(D) O NEG    Antibody Screen NEG    Sample Expiration 03/15/2019,2359    Unit Number [id number]    Blood Component Type RED CELLS,LR    Unit division 00    Status of Unit ISSUED    Transfusion Status OK TO TRANSFUSE    Crossmatch Result      Compatible Performed at [facility], [address] [place], [place] [address]   ABO/Rh     Status: None   Collection Time: 03/12/19  4:46 AM  Result Value Ref Range   ABO/RH(D)      O NEG Performed at [facility] [address], [place], [place] [address]   Prepare RBC     Status: None   Collection Time: 03/12/19  4:52 AM  Result Value Ref Range   Order Confirmation      ORDER PROCESSED BY BLOOD BANK Performed at [facility], [address], [place], [place] [address]   CBC     Status: Abnormal   Collection Time: 03/12/19  6:01 AM  Result Value Ref Range   WBC 14.3 (H) 4.0 - 10.5 K/uL   RBC 3.25 (L) 4.22 - 5.81 MIL/uL   Hemoglobin 9.0 (L) 13.0 - 17.0 g/dL   HCT 42.6 (L) 83.4 - 19.6 %   MCV 85.5 80.0 - 100.0 fL   MCH 27.7 26.0 - 34.0 pg   MCHC 32.4 30.0 - 36.0 g/dL   RDW 22.2 97.9 - 89.2 %   Platelets 293 150 - 400 K/uL   nRBC 0.0 0.0 - 0.2 %  Basic metabolic panel     Status: Abnormal   Collection Time: 03/12/19  9:36 AM  Result Value Ref Range   Sodium 132 (L) 135 - 145 mmol/L   Potassium 6.9 (HH) 3.5 - 5.1 mmol/L   Chloride 101 98 - 111 mmol/L   CO2 20 (L) 22 - 32 mmol/L   Glucose, Bld 371 (H) 70 - 99 mg/dL   BUN 69 (H) 6 - 20 mg/dL   Creatinine, Ser 1.19 (H) 0.61 - 1.24 mg/dL   Calcium 8.2 (L) 8.9 - 10.3 mg/dL   GFR calc non Af Amer 25 (L) >60 mL/min   GFR calc Af Amer 29 (L) >60 mL/min   Anion gap 11 5 - 15  CBC     Status: Abnormal   Collection Time: 03/12/19  9:36 AM  Result Value Ref Range   WBC 11.1 (H) 4.0 - 10.5 K/uL   RBC 3.19 (L) 4.22 - 5.81 MIL/uL    Hemoglobin 8.7 (L) 13.0 - 17.0 g/dL   HCT 41.7 (L) 40.8 - 14.4 %   MCV 84.3 80.0 - 100.0 fL   MCH 27.3 26.0 - 34.0 pg   MCHC 32.3 30.0 - 36.0 g/dL   RDW 81.8 56.3 - 14.9 %   Platelets 273 150 - 400 K/uL   nRBC 0.0 0.0 - 0.2 %  Basic metabolic panel     Status: Abnormal   Collection Time: 03/12/19 11:50 AM  Result Value Ref Range   Sodium 135 135 - 145 mmol/L   Potassium 6.0 (H) 3.5 - 5.1 mmol/L   Chloride 108 98 - 111 mmol/L   CO2 18 (L) 22 - 32 mmol/L   Glucose, Bld 341 (H) 70 - 99 mg/dL   BUN 70 (H) 6 - 20 mg/dL   Creatinine, Ser 7.02 (H) 0.61 - 1.24 mg/dL   Calcium 8.4 (L) 8.9 - 10.3 mg/dL   GFR calc non Af Amer 27 (L) >60 mL/min   GFR calc Af Amer 31 (L) >60 mL/min   Anion gap 9 5 - 15  Creatinine, urine, random     Status: None   Collection Time: 03/12/19 12:41 PM  Result Value Ref Range   Creatinine, Urine 40.74 mg/dL  Urinalysis, Routine w reflex microscopic     Status: Abnormal   Collection Time: 03/12/19 12:50 PM  Result Value Ref Range   Color, Urine YELLOW YELLOW   APPearance HAZY (A) CLEAR   Specific Gravity, Urine 1.013 1.005 - 1.030   pH 6.0 5.0 - 8.0   Glucose, UA 150 (A) NEGATIVE mg/dL   Hgb urine dipstick NEGATIVE NEGATIVE   Bilirubin Urine NEGATIVE NEGATIVE   Ketones, ur NEGATIVE  NEGATIVE mg/dL   Protein, ur 161 (A) NEGATIVE mg/dL   Nitrite NEGATIVE NEGATIVE   Leukocytes,Ua NEGATIVE NEGATIVE   RBC / HPF 0-5 0 - 5 RBC/hpf   WBC, UA 0-5 0 - 5 WBC/hpf   Bacteria, UA RARE (A) NONE SEEN   Squamous Epithelial / LPF 0-5 0 - 5   Mucus PRESENT    Amorphous Crystal PRESENT   HIV Antibody (routine testing w rflx)     Status: None   Collection Time: 03/12/19  1:00 PM  Result Value Ref Range   HIV Screen 4th Generation wRfx NON REACTIVE NON REACTIVE  CBC     Status: Abnormal   Collection Time: 03/12/19  1:00 PM  Result Value Ref Range   WBC 13.2 (H) 4.0 - 10.5 K/uL   RBC 3.11 (L) 4.22 - 5.81 MIL/uL   Hemoglobin 8.4 (L) 13.0 - 17.0 g/dL   HCT 09.6 (L)  04.5 - 52.0 %   MCV 87.8 80.0 - 100.0 fL   MCH 27.0 26.0 - 34.0 pg   MCHC 30.8 30.0 - 36.0 g/dL   RDW 40.9 81.1 - 91.4 %   Platelets 278 150 - 400 K/uL   nRBC 0.0 0.0 - 0.2 %  Sodium, urine, random     Status: None   Collection Time: 03/12/19  1:00 PM  Result Value Ref Range   Sodium, Ur 72 mmol/L  Iron and TIBC     Status: Abnormal   Collection Time: 03/12/19  1:00 PM  Result Value Ref Range   Iron 58 45 - 182 ug/dL   TIBC 782 (L) 956 - 213 ug/dL   Saturation Ratios 25 17.9 - 39.5 %   UIBC 174 ug/dL  Ferritin     Status: None   Collection Time: 03/12/19  1:00 PM  Result Value Ref Range   Ferritin 172 24 - 336 ng/mL  Reticulocytes     Status: Abnormal   Collection Time: 03/12/19  1:00 PM  Result Value Ref Range   Retic Ct Pct 1.9 0.4 - 3.1 %   RBC. 3.11 (L) 4.22 - 5.81 MIL/uL   Retic Count, Absolute 60.3 19.0 - 186.0 K/uL   Immature Retic Fract 25.5 (H) 2.3 - 15.9 %  Respiratory Panel by RT PCR (Flu A&B, Covid) - Nasopharyngeal Swab     Status: None   Collection Time: 03/12/19  1:09 PM   Specimen: Nasopharyngeal Swab  Result Value Ref Range   SARS Coronavirus 2 by RT PCR NEGATIVE NEGATIVE   Influenza A by PCR NEGATIVE NEGATIVE   Influenza B by PCR NEGATIVE NEGATIVE  CBG monitoring, ED     Status: Abnormal   Collection Time: 03/12/19  1:16 PM  Result Value Ref Range   Glucose-Capillary 245 (H) 70 - 99 mg/dL  Glucose, capillary     Status: Abnormal   Collection Time: 03/12/19  4:09 PM  Result Value Ref Range   Glucose-Capillary 304 (H) 70 - 99 mg/dL   Recent Results (from the past 240 hour(s))  Respiratory Panel by RT PCR (Flu A&B, Covid) - Nasopharyngeal Swab     Status: None   Collection Time: 03/12/19  1:09 PM   Specimen: Nasopharyngeal Swab  Result Value Ref Range Status   SARS Coronavirus 2 by RT PCR NEGATIVE NEGATIVE Final    Comment: (NOTE) SARS-CoV-2 target nucleic acids are NOT DETECTED. The SARS-CoV-2 RNA is generally detectable in upper  respiratoy specimens during the acute phase of infection. The lowest concentration of SARS-CoV-2  viral copies this assay can detect is 131 copies/mL. A negative result does not preclude SARS-Cov-2 infection and should not be used as the sole basis for treatment or other patient management decisions. A negative result may occur with  improper specimen collection/handling, submission of specimen other than nasopharyngeal swab, presence of viral mutation(s) within the areas targeted by this assay, and inadequate number of viral copies (<131 copies/mL). A negative result must be combined with clinical observations, patient history, and epidemiological information. The expected result is Negative. Fact Sheet for Patients:  https://www.moore.com/ Fact Sheet for Healthcare Providers:  https://www.young.biz/ This test is not yet ap proved or cleared by the [place] FDA and  has been authorized for detection and/or diagnosis of SARS-CoV-2 by FDA under an Emergency Use Authorization (EUA). This EUA will remain  in effect (meaning this test can be used) for the duration of the COVID-19 declaration under Section 564(b)(1) of the Act, 21 U.S.C. section 360bbb-3(b)(1), unless the authorization is terminated or revoked sooner.    Influenza A by PCR NEGATIVE NEGATIVE Final   Influenza B by PCR NEGATIVE NEGATIVE Final    Comment: (NOTE) The Xpert Xpress SARS-CoV-2/FLU/RSV assay is intended as an aid in  the diagnosis of influenza from Nasopharyngeal swab specimens and  should not be used as a sole basis for treatment. Nasal washings and  aspirates are unacceptable for Xpert Xpress SARS-CoV-2/FLU/RSV  testing. Fact Sheet for Patients: https://www.moore.com/ Fact Sheet for Healthcare Providers: https://www.young.biz/ This test is not yet approved or cleared by the [place] FDA and  has been authorized for  detection and/or diagnosis of SARS-CoV-2 by  FDA under an Emergency Use Authorization (EUA). This EUA will remain  in effect (meaning this test can be used) for the duration of the  Covid-19 declaration under Section 564(b)(1) of the Act, 21  U.S.C. section 360bbb-3(b)(1), unless the authorization is  terminated or revoked. Performed at [facility], [address], [place], [place] [address]    Creatinine: Recent Labs    03/12/19 3762 03/12/19 1150  CREATININE 2.72* 2.60*   Renal ultrasound personally reviewed and is detailed in the history of present illness  Procedure: Simple Foley catheter placement: Under sterile conditions, I advanced a 14 French urethral catheter into the urethra and into the bladder.  There was no resistance.  There was no difficulty with placement.  Impression/Assessment:  Difficult Foley catheter placement Acute renal insufficiency  Plan:  Continue Foley catheter per the primary team.  It can be removed whenever deemed appropriate.  Ray Church, III 03/12/2019, 6:12 PM

## 2019-03-12 NOTE — ED Notes (Signed)
Lunch Tray Ordered @ 1225. 

## 2019-03-12 NOTE — ED Notes (Signed)
Notified Dr. Shirlee Latch of patients BP being elevated (200's systolic). MD Mclean verified bp and would like to continue to monitor for now.

## 2019-03-12 NOTE — ED Triage Notes (Signed)
Came in with [facility], currently nosebleeding along with some confusion per wife, right now now disorientated to time. His baseline is A/Ox4. Has hx of taking Xarelto, reportedly been bleeding for an hour en route to the hospital. VSS, on RA, and recent CBG of 175.

## 2019-03-12 NOTE — ED Provider Notes (Signed)
9:23 AM Patient brought back to the exam room after patient had a syncopal episode.  He does not really remember what happened except he thinks he fell asleep.  Staff reports that he had a low blood pressure of around 90 systolic.  He was waiting on a ride.  No further bleeding and he continues to have no bleeding and nothing draining down the back of his throat.  He feels generally weak.  He feels generally weak though currently his blood pressure is now hypertensive.  We will check repeat CBC to ensure he has not lost more blood than we think as well as give some fluids and time.  Will get EKG.  Labs show stable hemoglobin but now an acute kidney injury with a creatinine of almost 3 and potassium of 6.9.  He was given IV fluids, calcium, insulin, and after discussion with Dr. Posey Pronto, Milly Jakob.  Patient was to have a Foley catheter for strict ins and outs as well as some urinary retention but nursing cannot pass Foley catheter for what apparently is a stricture.  Dr. Gloriann Loan consulted.   EKG Interpretation  Date/Time:  Tuesday March 12 2019 09:34:02 EST Ventricular Rate:  94 PR Interval:    QRS Duration: 149 QT Interval:  381 QTC Calculation: 477 R Axis:   168 Text Interpretation: Sinus rhythm Right bundle branch block RBBB similar to 2018 Confirmed by Sherwood Gambler [phone]) on 03/12/2019 11:05:38 AM       Results for orders placed or performed during the hospital encounter of 03/12/19  CBC  Result Value Ref Range   WBC 14.3 (H) 4.0 - 10.5 K/uL   RBC 3.25 (L) 4.22 - 5.81 MIL/uL   Hemoglobin 9.0 (L) 13.0 - 17.0 g/dL   HCT 27.8 (L) 39.0 - 52.0 %   MCV 85.5 80.0 - 100.0 fL   MCH 27.7 26.0 - 34.0 pg   MCHC 32.4 30.0 - 36.0 g/dL   RDW 14.1 11.5 - 15.5 %   Platelets 293 150 - 400 K/uL   nRBC 0.0 0.0 - 0.2 %  Basic metabolic panel  Result Value Ref Range   Sodium 132 (L) 135 - 145 mmol/L   Potassium 6.9 (HH) 3.5 - 5.1 mmol/L   Chloride 101 98 - 111 mmol/L   CO2 20 (L) 22 - 32 mmol/L   Glucose, Bld 371 (H) 70 - 99 mg/dL   BUN 69 (H) 6 - 20 mg/dL   Creatinine, Ser 2.72 (H) 0.61 - 1.24 mg/dL   Calcium 8.2 (L) 8.9 - 10.3 mg/dL   GFR calc non Af Amer 25 (L) >60 mL/min   GFR calc Af Amer 29 (L) >60 mL/min   Anion gap 11 5 - 15  CBC  Result Value Ref Range   WBC 11.1 (H) 4.0 - 10.5 K/uL   RBC 3.19 (L) 4.22 - 5.81 MIL/uL   Hemoglobin 8.7 (L) 13.0 - 17.0 g/dL   HCT 26.9 (L) 39.0 - 52.0 %   MCV 84.3 80.0 - 100.0 fL   MCH 27.3 26.0 - 34.0 pg   MCHC 32.3 30.0 - 36.0 g/dL   RDW 14.1 11.5 - 15.5 %   Platelets 273 150 - 400 K/uL   nRBC 0.0 0.0 - 0.2 %  Basic metabolic panel  Result Value Ref Range   Sodium 135 135 - 145 mmol/L   Potassium 6.0 (H) 3.5 - 5.1 mmol/L   Chloride 108 98 - 111 mmol/L   CO2 18 (L) 22 - 32 mmol/L  Glucose, Bld 341 (H) 70 - 99 mg/dL   BUN 70 (H) 6 - 20 mg/dL   Creatinine, Ser 0.35 (H) 0.61 - 1.24 mg/dL   Calcium 8.4 (L) 8.9 - 10.3 mg/dL   GFR calc non Af Amer 27 (L) >60 mL/min   GFR calc Af Amer 31 (L) >60 mL/min   Anion gap 9 5 - 15  CBG monitoring, ED  Result Value Ref Range   Glucose-Capillary 245 (H) 70 - 99 mg/dL  Type and screen [facility]  Result Value Ref Range   ABO/RH(D) O NEG    Antibody Screen NEG    Sample Expiration 03/15/2019,2359    Unit Number [id number]    Blood Component Type RED CELLS,LR    Unit division 00    Status of Unit ALLOCATED    Transfusion Status OK TO TRANSFUSE    Crossmatch Result      Compatible Performed at [facility], [address] [place], [place] [address]   Prepare RBC  Result Value Ref Range   Order Confirmation      ORDER PROCESSED BY BLOOD BANK Performed at [facility], [address], [place], [place] [address]   ABO/Rh  Result Value Ref Range   ABO/RH(D)      O NEG Performed at [facility], [address] [place], [place] [address]   [facility]  Result Value Ref Range   Blood Product Unit Number [id number]    PRODUCT CODE [id number]     Unit Type and Rh 9500    Blood Product Expiration Date 400867619509    No results found.   CRITICAL CARE Performed by: Audree Camel   Total critical care time: 35 minutes  Critical care time was exclusive of separately billable procedures and treating other patients.  Critical care was necessary to treat or prevent imminent or life-threatening deterioration.  Critical care was time spent personally by me on the following activities: development of treatment plan with patient and/or surrogate as well as nursing, discussions with consultants, evaluation of patient's response to treatment, examination of patient, obtaining history from patient or surrogate, ordering and performing treatments and interventions, ordering and review of laboratory studies, ordering and review of radiographic studies, pulse oximetry and re-evaluation of patient's condition.   Diagnosis: Hyperkalemia Acute kidney injury   Pricilla Loveless, MD 03/12/19 1326

## 2019-03-12 NOTE — Progress Notes (Addendum)
[facility] Progress Note    Assessment/ Plan:   1. Acute Kidney Injury: Cr 2.72 (Baselin 1.5-1.6 in 2020). Unclear etiology at this time. Ddx includes hemodynamic mediated kidney disease vs possible ATN. However, more likely progressive kidney disease from uncontrolled DM and HTN. FENa 3.1% indicating intrinsic pathology. UA negative for infection but is notable for proteinuria and glucosuria. Microscopy negative. Renal ultrasound negative for obstruction with some cortical thinning of right kidney.  - Will obtain PCR and SPEP/UPEP - PCP to fax over previous labs - avoid nephrotoxic agents  - monitor I/O's  2. Symptomatic Normocytic Normochromic Anemia  Epistaxis: Presented with epistaxis and subsequently developed hypotension and near syncope in the ED. Hgb 8.7 which does appear to be chronic for patient. Unclear if from acute bleed vs anemia of CKD. Epistaxis has resolved, stable with nasal packing. Currently receiving 1U pRBC per primary. Will continue to follow CBC's. Platelets WNL. Retic count 1.9%. - hold ASA, Xarelto - iron studies - 1U pRBC per primary - PO iron - nasal packing and Afrin nasal spray  3. Hyperkalemia: K 6.9 on admission. EKG with SR and chronic RBBB. S/p insulin and albuterol and calcium gluconate 1g.  - continue Lokelma 10mg  TID  - follow up afternoon BMP  4. Hyperglycemia  Insulin-Dependent DM: previously uncontrolled with last A1C >15.5. Repeat A1C pending. - Insulin per primary   5. HTN  Severe: Several lows, but is now significantly elevated 200's/90's. - start Nifedipime XL 30mg  QD  HPI:   [name] is a 54 y.o. male presenting with epistaxis while on Xarelto for a.fib with subsequent development of hypotension and near syncope prior to discharge. He was found to have symptomatic anemia with Hgb 8.7 (which appears to be about baseline), AKI with Cr of 2.72 (baseline 0.7), and hyperkalemia of 6.9. He has a PMH notable for Atrial  fibrillation on Xarelto, HTN, HLD, obesity, uncontrolled T2DM with last Hgb A1C of >15.5.   Patient notes he has intermittent nose bleeds over the last several months. Denies any lightheadedness or dizziness. Denis any other bleeding such as hematemesis, hematochezia, melena, or hematemesis. In regards to his kidneys, denies any dysuria, dribbling, hesitancy, or incomplete voiding. Does endorse nocturia of 3-4 times overnight. Denies family history of kidney disease. Notes his last A1C at his PCP's office was 12-13. Currently on insulin for diabetes and endorses compliance. Denies ever having a colonoscopy. Denies any NSAIDs, antibiotics, antipsychotics. Denies any recent illnesses.   Spoke to receptionist at [facility] who provided some prior lab values: 01/2019: Cr 1.69, GFR 45, A1C 12.4, Hgb 10.7 07/2018: Cr 1.59, A1C 7.5, 24h Urine protein: 02/2019 mg/24 06/2018: A1C 10.4, Hgb 10.4   Objective:   BP (!) 206/77   Pulse 87   Temp 98.6 F (37 C) (Oral)   Resp 18   SpO2 97%   Intake/Output Summary (Last 24 hours) at 03/12/2019 1449 Last data filed at 03/12/2019 1337 Gross per 24 hour  Intake 2050 ml  Output -  Net 2050 ml   Weight change:   Physical Exam: General: pleasant older caucasian male, pale appearing, lying comfortably in ED bed in no acute distress with non-toxic appearance HEENT: pale conjunctivitis, MMM CV: regular rate and rhythm without murmurs, rubs, or gallops, no LE edema Lungs: clear to auscultation bilaterally with normal work of breathing on room air Abdomen: soft, non-tender, non-distended, normoactive bowel sounds Skin: pallor throughout, cool, dry, no rashes or lesions MSK: weak overall as he had significant  difficulty pulling himself up in bed to eat  Neuro: Alert and oriented, speech normal  Imaging: US RENAL  Result Date: 03/12/2019 CLINICAL DATA:  Acute renal injury EXAM: RENAL / URINARY TRACT ULTRASOUND COMPLETE COMPARISON:  None. FINDINGS: Right Kidney:  Renal measurements: 10.6 x 5.6 x 6.2 cm = volume: 192 mL. Cortical thinning is noted without obstructive change. Left Kidney: Renal measurements: 13.6 x 6.3 x 7.0 = volume: 313 mL. Echogenicity within normal limits. No mass or hydronephrosis visualized. Bladder: Partially decompressed Other: None. IMPRESSION: Cortical thinning is noted on the right. No obstructive changes are noted. Electronically Signed   By: Inez Catalina M.D.   On: 03/12/2019 13:46    Labs: BMET Recent Labs  Lab 03/12/19 0936 03/12/19 1150  NA 132* 135  K 6.9* 6.0*  CL 101 108  CO2 20* 18*  GLUCOSE 371* 341*  BUN 69* 70*  CREATININE 2.72* 2.60*  CALCIUM 8.2* 8.4*   CBC Recent Labs  Lab 03/12/19 0601 03/12/19 0936 03/12/19 1300  WBC 14.3* 11.1* 13.2*  HGB 9.0* 8.7* 8.4*  HCT 27.8* 26.9* 27.3*  MCV 85.5 84.3 87.8  PLT 293 273 278    Medications:    . albuterol  10 mg Nebulization Once  . enoxaparin (LOVENOX) injection  30 mg Subcutaneous Q24H  . ferrous sulfate  325 mg Oral BID  . insulin aspart  0-15 Units Subcutaneous TID WC  . insulin aspart  0-5 Units Subcutaneous QHS  . insulin detemir  20 Units Subcutaneous QHS  . loratadine  10 mg Oral Daily  . NIFEdipine  30 mg Oral Daily  . sodium zirconium cyclosilicate  10 g Oral TID  . tamsulosin  0.4 mg Oral Daily  . tranexamic acid  500 mg Topical Once     Mina Marble, DO [facility] Resident, PGY2 03/12/2019, 2:49 PM

## 2019-03-12 NOTE — ED Notes (Signed)
Informed Dr. Criss Alvine of unsuccessful attempt at at foley insertion due to resistance. (suspected stricture?)

## 2019-03-12 NOTE — ED Notes (Signed)
ED TO INPATIENT HANDOFF REPORT  ED Nurse Name and Phone #: Clive Parcel [phone]  S Name/Age/Gender [name] 54 y.o. male Room/Bed: 023C/023C  Code Status   Code Status: Full Code  Home/SNF/Other Home Patient oriented to: self, place, time and situation Is this baseline? Yes   Triage Complete: Triage complete  Chief Complaint AKI (acute kidney injury) (HCC) [N17.9]  Triage Note Came in with [facility], currently nosebleeding along with some confusion per wife, right now now disorientated to time. His baseline is A/Ox4. Has hx of taking Xarelto, reportedly been bleeding for an hour en route to the hospital. VSS, on RA, and recent CBG of 175.     Allergies No Known Allergies  Level of Care/Admitting Diagnosis ED Disposition    ED Disposition Condition Comment   Admit  Hospital Area: [facility] [[id number]]  Level of Care: Progressive [102]  Admit to Progressive based on following criteria: NEPHROLOGY stable condition requiring close monitoring for AKI, requiring Hemodialysis or Peritoneal Dialysis either from expected electrolyte imbalance, acidosis, or fluid overload that can be managed by NIPPV or high flow oxygen.  Covid Evaluation: Asymptomatic Screening Protocol (No Symptoms)  Diagnosis: AKI (acute kidney injury) [facility]) [[id number]]  Admitting Physician: Tyson Alias [[id number]]  Attending Physician: Tyson Alias [phone]       B Medical/Surgery History Past Medical History:  Diagnosis Date  . Atrial fib/flutter, transient 09/19/2011  . Elevated cholesterol with elevated triglycerides   . Hypertension   . Kidney stones   . Pancreatitis 1999  . Pneumonia ~ 2003  . Sleep apnea    nO OFFICALLY DIAGNOSIED, SNORES LOUDLY AND WIFE HAS WITNESSED APNEA WHEN HE IS SLEEPING  . Type II diabetes mellitus (HCC)    Past Surgical History:  Procedure Laterality Date  . CARDIOVERSION  09/30/2011   Procedure: CARDIOVERSION;  Surgeon: Wendall Stade, MD;  Location: [facility] ENDOSCOPY;  Service: Cardiovascular;  Laterality: N/A;  . EYE SURGERY    . PARS PLANA VITRECTOMY  12/08/2011   left  . PARS PLANA VITRECTOMY  12/08/2011   Procedure: PARS PLANA VITRECTOMY WITH 25 GAUGE;  Surgeon: Sherrie George, MD;  Location: [facility] OR;  Service: Ophthalmology;  Laterality: Left;  . TEE WITHOUT CARDIOVERSION  09/30/2011   Procedure: TRANSESOPHAGEAL ECHOCARDIOGRAM (TEE);  Surgeon: Wendall Stade, MD;  Location: [facility] ENDOSCOPY;  Service: Cardiovascular;  Laterality: N/A;  kristine/ebp/beverly ( or scheduling)/ time rescheduled from 1300 to 1200 talked ( mary)     A IV Location/Drains/Wounds Patient Lines/Drains/Airways Status   Active Line/Drains/Airways    Name:   Placement date:   Placement time:   Site:   Days:   Peripheral IV 03/12/19 Left Forearm   03/12/19    0849    Forearm   less than 1   Peripheral IV 03/12/19 Right Wrist   03/12/19    1338    Wrist   less than 1   Rectal Tube/Pouch   02/14/17    1200    -   756   Urethral Catheter Chloe Addison Straight-tip   02/06/17    0030    Straight-tip   764   Incision 12/08/11 Eye Left   12/08/11    1321     2651   Pressure Injury 01/19/17 Stage II -  Partial thickness loss of dermis presenting as a shallow open ulcer with a red, pink wound bed without slough.   01/19/17    2133     782  Pressure Injury 01/19/17 Unstageable - Full thickness tissue loss in which the base of the ulcer is covered by slough (yellow, tan, gray, green or brown) and/or eschar (tan, brown or black) in the wound bed.   01/19/17    1900     782   Pressure Injury 01/27/17 Deep Tissue Injury - Purple or maroon localized area of discolored intact skin or blood-filled blister due to damage of underlying soft tissue from pressure and/or shear. PT   01/27/17    1600     774          Intake/Output Last 24 hours  Intake/Output Summary (Last 24 hours) at 03/12/2019 1455 Last data filed at 03/12/2019 1337 Gross per 24 hour  Intake 2050 ml   Output -  Net 2050 ml    Labs/Imaging Results for orders placed or performed during the hospital encounter of 03/12/19 (from the past 48 hour(s))  Type and screen [place]     Status: None (Preliminary result)   Collection Time: 03/12/19  4:46 AM  Result Value Ref Range   ABO/RH(D) O NEG    Antibody Screen NEG    Sample Expiration 03/15/2019,2359    Unit Number [id number]    Blood Component Type RED CELLS,LR    Unit division 00    Status of Unit ISSUED    Transfusion Status OK TO TRANSFUSE    Crossmatch Result      Compatible Performed at [facility], [address] [place], [place] [address]   ABO/Rh     Status: None   Collection Time: 03/12/19  4:46 AM  Result Value Ref Range   ABO/RH(D)      O NEG Performed at [place] [address], [place], [place] [address]   Prepare RBC     Status: None   Collection Time: 03/12/19  4:52 AM  Result Value Ref Range   Order Confirmation      ORDER PROCESSED BY BLOOD BANK Performed at [facility], [place] [address], [place], [place] [address]   CBC     Status: Abnormal   Collection Time: 03/12/19  6:01 AM  Result Value Ref Range   WBC 14.3 (H) 4.0 - 10.5 K/uL   RBC 3.25 (L) 4.22 - 5.81 MIL/uL   Hemoglobin 9.0 (L) 13.0 - 17.0 g/dL   HCT 27.8 (L) 39.0 - 52.0 %   MCV 85.5 80.0 - 100.0 fL   MCH 27.7 26.0 - 34.0 pg   MCHC 32.4 30.0 - 36.0 g/dL   RDW 14.1 11.5 - 15.5 %   Platelets 293 150 - 400 K/uL   nRBC 0.0 0.0 - 0.2 %    Comment: Performed at [facility], [place] [address] [place],  [address]  Basic metabolic panel     Status: Abnormal   Collection Time: 03/12/19  9:36 AM  Result Value Ref Range   Sodium 132 (L) 135 - 145 mmol/L   Potassium 6.9 (HH) 3.5 - 5.1 mmol/L    Comment: NO VISIBLE HEMOLYSIS CRITICAL RESULT CALLED TO, READ BACK BY AND VERIFIED WITH: N [email or web address] 03/12/2019 1053 WILDERK    Chloride 101 98 - 111 mmol/L   CO2 20 (L) 22 - 32 mmol/L   Glucose,  Bld 371 (H) 70 - 99 mg/dL   BUN 69 (H) 6 - 20 mg/dL   Creatinine, Ser 2.72 (H) 0.61 - 1.24 mg/dL   Calcium 8.2 (L) 8.9 - 10.3  mg/dL   GFR calc non Af Amer 25 (L) >60 mL/min   GFR calc Af Amer 29 (L) >60 mL/min   Anion gap 11 5 - 15    Comment: Performed at [facility], [address] [place], [place] [address]  CBC     Status: Abnormal   Collection Time: 03/12/19  9:36 AM  Result Value Ref Range   WBC 11.1 (H) 4.0 - 10.5 K/uL   RBC 3.19 (L) 4.22 - 5.81 MIL/uL   Hemoglobin 8.7 (L) 13.0 - 17.0 g/dL   HCT 24.2 (L) 68.3 - 41.9 %   MCV 84.3 80.0 - 100.0 fL   MCH 27.3 26.0 - 34.0 pg   MCHC 32.3 30.0 - 36.0 g/dL   RDW 62.2 29.7 - 98.9 %   Platelets 273 150 - 400 K/uL   nRBC 0.0 0.0 - 0.2 %    Comment: Performed at [facility], [address] [place], [place] [address]  Basic metabolic panel     Status: Abnormal   Collection Time: 03/12/19 11:50 AM  Result Value Ref Range   Sodium 135 135 - 145 mmol/L   Potassium 6.0 (H) 3.5 - 5.1 mmol/L   Chloride 108 98 - 111 mmol/L   CO2 18 (L) 22 - 32 mmol/L   Glucose, Bld 341 (H) 70 - 99 mg/dL   BUN 70 (H) 6 - 20 mg/dL   Creatinine, Ser 1.74 (H) 0.61 - 1.24 mg/dL   Calcium 8.4 (L) 8.9 - 10.3 mg/dL   GFR calc non Af Amer 27 (L) >60 mL/min   GFR calc Af Amer 31 (L) >60 mL/min   Anion gap 9 5 - 15    Comment: Performed at [facility], [address] [place], [place] [address]  Creatinine, urine, random     Status: None   Collection Time: 03/12/19 12:41 PM  Result Value Ref Range   Creatinine, Urine 40.74 mg/dL    Comment: Performed at [facility], [address], [place], [place] [address]  Urinalysis, Routine w reflex microscopic     Status: Abnormal   Collection Time: 03/12/19 12:50 PM  Result Value Ref Range   Color, Urine YELLOW YELLOW   APPearance HAZY (A) CLEAR   Specific Gravity, Urine 1.013 1.005 - 1.030   pH 6.0 5.0 - 8.0   Glucose, UA 150 (A) NEGATIVE mg/dL   Hgb urine dipstick NEGATIVE NEGATIVE    Bilirubin Urine NEGATIVE NEGATIVE   Ketones, ur NEGATIVE NEGATIVE mg/dL   Protein, ur 314 (A) NEGATIVE mg/dL   Nitrite NEGATIVE NEGATIVE   Leukocytes,Ua NEGATIVE NEGATIVE   RBC / HPF 0-5 0 - 5 RBC/hpf   WBC, UA 0-5 0 - 5 WBC/hpf   Bacteria, UA RARE (A) NONE SEEN   Squamous Epithelial / LPF 0-5 0 - 5   Mucus PRESENT    Amorphous Crystal PRESENT     Comment: Performed at [facility], [address], [place], [place] [address]  CBC     Status: Abnormal   Collection Time: 03/12/19  1:00 PM  Result Value Ref Range   WBC 13.2 (H) 4.0 - 10.5 K/uL   RBC 3.11 (L) 4.22 - 5.81 MIL/uL   Hemoglobin 8.4 (L) 13.0 - 17.0 g/dL   HCT 37.8 (L) 58.8 - 50.2 %   MCV 87.8 80.0 - 100.0 fL   MCH 27.0 26.0 - 34.0 pg   MCHC 30.8 30.0 - 36.0 g/dL  RDW 14.3 11.5 - 15.5 %   Platelets 278 150 - 400 K/uL   nRBC 0.0 0.0 - 0.2 %    Comment: Performed at [facility], [address] [place], [place] [address]  Sodium, urine, random     Status: None   Collection Time: 03/12/19  1:00 PM  Result Value Ref Range   Sodium, Ur 72 mmol/L    Comment: Performed at [facility], [address] [place], [place] [address]  Iron and TIBC     Status: Abnormal   Collection Time: 03/12/19  1:00 PM  Result Value Ref Range   Iron 58 45 - 182 ug/dL   TIBC 130232 (L) 865250 - 784450 ug/dL   Saturation Ratios 25 17.9 - 39.5 %   UIBC 174 ug/dL    Comment: Performed at [facility], [address] [place], [place] [address]  Ferritin     Status: None   Collection Time: 03/12/19  1:00 PM  Result Value Ref Range   Ferritin 172 24 - 336 ng/mL    Comment: Performed at [facility], [address] [place], [place] [address]  Reticulocytes     Status: Abnormal   Collection Time: 03/12/19  1:00 PM  Result Value Ref Range   Retic Ct Pct 1.9 0.4 - 3.1 %   RBC. 3.11 (L) 4.22 - 5.81 MIL/uL   Retic Count, Absolute 60.3 19.0 - 186.0 K/uL   Immature Retic Fract 25.5 (H) 2.3 - 15.9 %    Comment: Performed at  [facility], [address] [place], [place] [address]  Respiratory Panel by RT PCR (Flu A&B, Covid) - Nasopharyngeal Swab     Status: None   Collection Time: 03/12/19  1:09 PM   Specimen: Nasopharyngeal Swab  Result Value Ref Range   SARS Coronavirus 2 by RT PCR NEGATIVE NEGATIVE    Comment: (NOTE) SARS-CoV-2 target nucleic acids are NOT DETECTED. The SARS-CoV-2 RNA is generally detectable in upper respiratoy specimens during the acute phase of infection. The lowest concentration of SARS-CoV-2 viral copies this assay can detect is 131 copies/mL. A negative result does not preclude SARS-Cov-2 infection and should not be used as the sole basis for treatment or other patient management decisions. A negative result may occur with  improper specimen collection/handling, submission of specimen other than nasopharyngeal swab, presence of viral mutation(s) within the areas targeted by this assay, and inadequate number of viral copies (<131 copies/mL). A negative result must be combined with clinical observations, patient history, and epidemiological information. The expected result is Negative. Fact Sheet for Patients:  https://www.moore.com/https://www.fda.gov/media/142436/download Fact Sheet for Healthcare Providers:  https://www.young.biz/https://www.fda.gov/media/142435/download This test is not yet ap proved or cleared by the [place] FDA and  has been authorized for detection and/or diagnosis of SARS-CoV-2 by FDA under an Emergency Use Authorization (EUA). This EUA will remain  in effect (meaning this test can be used) for the duration of the COVID-19 declaration under Section 564(b)(1) of the Act, 21 U.S.C. section 360bbb-3(b)(1), unless the authorization is terminated or revoked sooner.    Influenza A by PCR NEGATIVE NEGATIVE   Influenza B by PCR NEGATIVE NEGATIVE    Comment: (NOTE) The Xpert Xpress SARS-CoV-2/FLU/RSV assay is intended as an aid in  the diagnosis of influenza from Nasopharyngeal swab specimens  and  should not be used as a sole basis for treatment. Nasal washings and  aspirates are unacceptable for Xpert Xpress SARS-CoV-2/FLU/RSV  testing.  Fact Sheet for Patients: https://www.moore.com/ Fact Sheet for Healthcare Providers: https://www.young.biz/ This test is not yet approved or cleared by the [place] FDA and  has been authorized for detection and/or diagnosis of SARS-CoV-2 by  FDA under an Emergency Use Authorization (EUA). This EUA will remain  in effect (meaning this test can be used) for the duration of the  Covid-19 declaration under Section 564(b)(1) of the Act, 21  U.S.C. section 360bbb-3(b)(1), unless the authorization is  terminated or revoked. Performed at [facility], [address], [place], [place] [address]   CBG monitoring, ED     Status: Abnormal   Collection Time: 03/12/19  1:16 PM  Result Value Ref Range   Glucose-Capillary 245 (H) 70 - 99 mg/dL   US RENAL  Result Date: 03/12/2019 CLINICAL DATA:  Acute renal injury EXAM: RENAL / URINARY TRACT ULTRASOUND COMPLETE COMPARISON:  None. FINDINGS: Right Kidney: Renal measurements: 10.6 x 5.6 x 6.2 cm = volume: 192 mL. Cortical thinning is noted without obstructive change. Left Kidney: Renal measurements: 13.6 x 6.3 x 7.0 = volume: 313 mL. Echogenicity within normal limits. No mass or hydronephrosis visualized. Bladder: Partially decompressed Other: None. IMPRESSION: Cortical thinning is noted on the right. No obstructive changes are noted. Electronically Signed   By: Alcide Clever M.D.   On: 03/12/2019 13:46    Pending Labs Unresulted Labs (From admission, onward)    Start     Ordered   03/13/19 0500  CBC  Tomorrow morning,   R     03/12/19 1155   03/13/19 0500  Renal function panel  Tomorrow morning,   R     03/12/19 1450   03/13/19 0500  PTH, intact and calcium  Tomorrow morning,   R     03/12/19 1450   03/12/19 1600  Basic metabolic panel  Once-Timed,   STAT      03/12/19 1114   03/12/19 1441  Protein electrophoresis, serum  Once,   STAT     03/12/19 1440   03/12/19 1441  UPEP/UIFE/Light Chains/TP, 24-Hr Ur  Once,   STAT     03/12/19 1440   03/12/19 1429  Hepatic function panel  Add-on,   AD     03/12/19 1428   03/12/19 1418  Protein / creatinine ratio, urine  Add-on,   AD     03/12/19 1417   03/12/19 1213  Hemoglobin A1c  Once,   STAT    Comments: To assess prior glycemic control    03/12/19 1212   03/12/19 1153  HIV Antibody (routine testing w rflx)  (HIV Antibody (Routine testing w reflex) panel)  Once,   STAT     03/12/19 1155          Vitals/Pain Today's Vitals   03/12/19 1045 03/12/19 1249 03/12/19 1426 03/12/19 1427  BP: (!) 204/110 (!) 208/93  (!) 206/77  Pulse: (!) 103 93 87   Resp: (!) 23 17  18   Temp:   98.6 F (37 C)   TempSrc:   Oral   SpO2: 100% 99% 97%   PainSc:        Isolation Precautions No active isolations  Medications Medications  tranexamic acid (CYKLOKAPRON) 1000 MG/10ML topical solution 500 mg (has no administration in time range)  sodium zirconium cyclosilicate (LOKELMA) packet 10 g (10 g Oral Given 03/12/19 1130)  insulin detemir (LEVEMIR) injection 20 Units (has no administration in time range)  tamsulosin (FLOMAX) capsule 0.4 mg (has no administration in time  range)  ferrous sulfate tablet 325 mg (has no administration in time range)  loratadine (CLARITIN) tablet 10 mg (has no administration in time range)  enoxaparin (LOVENOX) injection 30 mg (has no administration in time range)  acetaminophen (TYLENOL) tablet 650 mg (has no administration in time range)    Or  acetaminophen (TYLENOL) suppository 650 mg (has no administration in time range)  ondansetron (ZOFRAN) tablet 4 mg (has no administration in time range)    Or  ondansetron (ZOFRAN) injection 4 mg (has no administration in time range)  albuterol (PROVENTIL) (2.5 MG/3ML) 0.083% nebulizer solution 10 mg (has no administration in time  range)  insulin aspart (novoLOG) injection 0-15 Units (has no administration in time range)  insulin aspart (novoLOG) injection 0-5 Units (has no administration in time range)  NIFEdipine (PROCARDIA-XL/NIFEDICAL-XL) 24 hr tablet 30 mg (has no administration in time range)  oxymetazoline (AFRIN) 0.05 % nasal spray 1 spray (1 spray Each Nare Given 03/12/19 0645)  sodium chloride 0.9 % bolus 1,000 mL (0 mLs Intravenous Stopped 03/12/19 1111)  calcium gluconate 1 g/ 50 mL sodium chloride IVPB (0 g Intravenous Stopped 03/12/19 1216)  insulin aspart (novoLOG) injection 5 Units (5 Units Intravenous Given 03/12/19 1131)  lactated ringers bolus 1,000 mL (0 mLs Intravenous Stopped 03/12/19 1337)  0.9 %  sodium chloride infusion (Manually program via Guardrails IV Fluids) ( Intravenous New Bag/Given 03/12/19 1431)    Mobility walks with device Moderate fall risk   Focused Assessments Renal Assessment Handoff:   R Recommendations: See Admitting Provider Note  Report given to:   Additional Notes:

## 2019-03-12 NOTE — ED Provider Notes (Signed)
[facility] EMERGENCY DEPARTMENT Provider Note   CSN: [id number] Arrival date & time: 03/12/19  0441     History Chief Complaint  Patient presents with  . Epistaxis    [name] is a 54 y.o. male.  Patient presents to the emergency department with a chief complaint of epistaxis.  He states that his nose started bleeding sometime in the night.  He is uncertain when.  He takes Xarelto for A. fib.  He has been unable to control the bleeding.  Has tried applying pressure.  Denies any pain.  The history is provided by the patient. No language interpreter was used.       Past Medical History:  Diagnosis Date  . Atrial fib/flutter, transient 09/19/2011  . Elevated cholesterol with elevated triglycerides   . Hypertension   . Kidney stones   . Pancreatitis 1999  . Pneumonia ~ 2003  . Sleep apnea    nO OFFICALLY DIAGNOSIED, SNORES LOUDLY AND WIFE HAS WITNESSED APNEA WHEN HE IS SLEEPING  . Type II diabetes mellitus [facility])     Patient Active Problem List   Diagnosis Date Noted  . Adjustment disorder with depressed mood   . Palliative care by specialist   . Goals of care, counseling/discussion   . Advance care planning   . Acute respiratory failure with hypoxia (HCC) 01/30/2017  . Severe sepsis with septic shock (HCC) 01/30/2017  . Type 2 diabetes mellitus with stage 3 chronic kidney disease (HCC) 01/30/2017  . Hypernatremia 01/30/2017  . Hypokalemia 01/30/2017  . Sacral decubitus ulcer, stage III (HCC) 01/30/2017  . ARDS (adult respiratory distress syndrome) (HCC) 01/30/2017  . Acute metabolic encephalopathy 01/30/2017  . AKI (acute kidney injury) (HCC) 01/30/2017  . Pressure injury of skin 01/22/2017  . Malnutrition of moderate degree 01/19/2017  . Pneumonia 01/18/2017  . Atrial flutter (HCC) 09/19/2011  . Obesity 09/19/2011  . Diabetic ketoacidosis with coma associated with type 2 diabetes mellitus (HCC) 09/19/2011  . Mixed hyperlipidemia  09/19/2011  . Hypertension   . Chronic kidney disease   . Pancreatitis   . Elevated cholesterol with elevated triglycerides   . Sleep apnea   . Vitreous hemorrhage (HCC) 08/03/2011    Past Surgical History:  Procedure Laterality Date  . CARDIOVERSION  09/30/2011   Procedure: CARDIOVERSION;  Surgeon: Wendall Stade, MD;  Location: [facility] ENDOSCOPY;  Service: Cardiovascular;  Laterality: N/A;  . EYE SURGERY    . PARS PLANA VITRECTOMY  12/08/2011   left  . PARS PLANA VITRECTOMY  12/08/2011   Procedure: PARS PLANA VITRECTOMY WITH 25 GAUGE;  Surgeon: Sherrie George, MD;  Location: [facility] OR;  Service: Ophthalmology;  Laterality: Left;  . TEE WITHOUT CARDIOVERSION  09/30/2011   Procedure: TRANSESOPHAGEAL ECHOCARDIOGRAM (TEE);  Surgeon: Wendall Stade, MD;  Location: [facility] ENDOSCOPY;  Service: Cardiovascular;  Laterality: N/A;  kristine/ebp/beverly ( or scheduling)/ time rescheduled from 1300 to 1200 talked ( mary)       No family history on file.  Social History   Tobacco Use  . Smoking status: Never Smoker  . Smokeless tobacco: Never Used  Substance Use Topics  . Alcohol use: Yes    Comment: 12/08/2011 "maybe 1-2 beers/month average"  . Drug use: No    Home Medications Prior to Admission medications   Medication Sig Start Date End Date Taking? Authorizing Provider  Amino Acids-Protein Hydrolys (FEEDING SUPPLEMENT, PRO-STAT SUGAR FREE 64,) LIQD Take 30 mLs by mouth 2 (two) times daily. 02/14/17  Rodolph Bong, MD  Ascorbic Acid (VITAMIN C) 1000 MG tablet Take 500 mg by mouth daily.    [provider]  aspirin EC 81 MG tablet Take 81 mg by mouth daily.    [provider]  cholestyramine light (PREVALITE) 4 g packet Take 1 packet (4 g total) by mouth 3 (three) times daily. 02/14/17   Rodolph Bong, MD  citalopram (CELEXA) 20 MG tablet Take 1 tablet (20 mg total) by mouth daily. 02/15/17   Rodolph Bong, MD  collagenase (SANTYL) ointment Apply topically daily.  Apply to coccyx and ischial wounds once daily. 02/15/17   Rodolph Bong, MD  enoxaparin (LOVENOX) 40 MG/0.4ML injection Inject 0.4 mLs (40 mg total) into the skin daily. 02/15/17   Rodolph Bong, MD  fexofenadine (ALLEGRA) 180 MG tablet Take 180 mg by mouth daily.    [provider]  Hydrocortisone (GERHARDT'S BUTT CREAM) CREA Apply 1 application topically 3 (three) times daily. Apply to all the red areas on his buttocks and thighs.  Clean with soap and water, dry before each application. 02/14/17   Rodolph Bong, MD  insulin aspart (NOVOLOG) 100 UNIT/ML injection Inject 0-15 Units into the skin 3 (three) times daily with meals. 02/14/17   Rodolph Bong, MD  insulin aspart (NOVOLOG) 100 UNIT/ML injection Inject 0-5 Units into the skin at bedtime. 02/14/17   Rodolph Bong, MD  insulin glargine (LANTUS) 100 UNIT/ML injection Inject 0.2 mLs (20 Units total) into the skin daily. 02/15/17   Rodolph Bong, MD  Multiple Vitamin (MULTIVITAMIN WITH MINERALS) TABS tablet Take 1 tablet by mouth daily.    [provider]  niacin 500 MG tablet Take 500 mg by mouth 2 (two) times daily with a meal.    [provider]    Allergies    Patient has no known allergies.  Review of Systems   Review of Systems  All other systems reviewed and are negative.   Physical Exam Updated Vital Signs BP (!) 215/105   Pulse (!) 113   SpO2 100%   Physical Exam Vitals and nursing note reviewed.  Constitutional:      General: He is not in acute distress.    Appearance: He is well-developed. He is not ill-appearing.  HENT:     Head: Normocephalic and atraumatic.     Nose:     Comments: Brisk bleed from right nostril, unable to visual location, large clot present Eyes:     Conjunctiva/sclera: Conjunctivae normal.     Comments: Mild R lacrimal bleeding  Cardiovascular:     Comments: tachycardic Pulmonary:     Effort: Pulmonary effort is normal. No respiratory  distress.  Abdominal:     General: There is no distension.  Musculoskeletal:     Cervical back: Neck supple.     Comments: Moves all extremities  Skin:    General: Skin is warm and dry.  Neurological:     Mental Status: He is alert and oriented to person, place, and time.  Psychiatric:        Mood and Affect: Mood normal.        Behavior: Behavior normal.     ED Results / Procedures / Treatments   Labs (all labs ordered are listed, but only abnormal results are displayed) Labs Reviewed  CBC - Abnormal; Notable for the following components:      Result Value   WBC 14.3 (*)    RBC 3.25 (*)  Hemoglobin 9.0 (*)    HCT 27.8 (*)    All other components within normal limits    EKG None  Radiology No results found.  Procedures .Epistaxis Management  Date/Time: 03/12/2019 5:40 AM Performed by: Montine Circle, PA-C Authorized by: Montine Circle, PA-C   Consent:    Consent obtained:  Verbal and emergent situation   Consent given by:  Patient   Risks discussed:  Bleeding and pain   Alternatives discussed:  Observation Anesthesia (see MAR for exact dosages):    Anesthesia method:  Topical application   Topical anesthetic:  Lidocaine gel Procedure details:    Treatment site:  R posterior   Treatment method:  Nasal balloon (7.5 cm rapid rhino)   Treatment complexity:  Extensive   Treatment episode: initial   Post-procedure details:    Assessment:  Bleeding stopped   Patient tolerance of procedure:  Tolerated well, no immediate complications Comments:     Rapid rhino soaked in TXA and packed achieving hemostasis   (including critical care time)  Medications Ordered in ED Medications  oxymetazoline (AFRIN) 0.05 % nasal spray 1 spray (has no administration in time range)  tranexamic acid (CYKLOKAPRON) 1000 MG/10ML topical solution 500 mg (has no administration in time range)    ED Course  I have reviewed the triage vital signs and the nursing  notes.  Pertinent labs & imaging results that were available during my care of the patient were reviewed by me and considered in my medical decision making (see chart for details).    MDM Rules/Calculators/A&P                      Patient on Xarelto.  Rapid blood flow from right nostril.  Unable to visualize the source.  A large clot was removed by me, but the patient still had significant clot burden.  Dr. Dina Rich was called to the bedside for assistance, but fortunately patient sneezed and the rest of the clot burden was eliminated.  Patient was given bedside Afrin nasal spray with some improvement.  I then proceeded to place a rapid Rhino packing, 7.5 cm soaked in TXA.  No further bleeding was noted immediately following the procedure.  No blood flow in oropharynx. will reassess in 15 minutes.  6:25 AM Still no bleeding.  DC to home.  ENT f/u for packing removal in 3 days.  Final Clinical Impression(s) / ED Diagnoses Final diagnoses:  Epistaxis    Rx / DC Orders ED Discharge Orders         Ordered    cephALEXin (KEFLEX) 500 MG capsule  2 times daily     03/12/19 0622           Montine Circle, PA-C 03/12/19 3710    Dina Rich Barbette Hair, MD 03/13/19 [phone]

## 2019-03-12 NOTE — ED Notes (Addendum)
Pt roomed to 23, pale, hypotensive and diaphoretic from lobby after d/c. Lying in semi-fowler's now and IV started. BP and mentation better on recheck

## 2019-03-12 NOTE — Progress Notes (Signed)
After blood transfusion patient BP 219/91, patient alert and oriented, denies pain. MD notified will continue to monitor.

## 2019-03-12 NOTE — Progress Notes (Signed)
Interim Progress Note: Per primary team who has also spoken to his PCP. Patient has not been on any anti-hypertensive's since 1998 in which he was on Lisinopril-HCTZ. This was discontinued at that time due to cough.

## 2019-03-13 ENCOUNTER — Encounter (HOSPITAL_COMMUNITY): Payer: Self-pay | Admitting: Student in an Organized Health Care Education/Training Program

## 2019-03-13 ENCOUNTER — Other Ambulatory Visit: Payer: Self-pay

## 2019-03-13 DIAGNOSIS — I4510 Unspecified right bundle-branch block: Secondary | ICD-10-CM | POA: Diagnosis present

## 2019-03-13 DIAGNOSIS — E875 Hyperkalemia: Secondary | ICD-10-CM | POA: Diagnosis present

## 2019-03-13 DIAGNOSIS — D62 Acute posthemorrhagic anemia: Secondary | ICD-10-CM | POA: Diagnosis present

## 2019-03-13 DIAGNOSIS — D691 Qualitative platelet defects: Secondary | ICD-10-CM | POA: Diagnosis present

## 2019-03-13 DIAGNOSIS — G4730 Sleep apnea, unspecified: Secondary | ICD-10-CM | POA: Diagnosis present

## 2019-03-13 DIAGNOSIS — E781 Pure hyperglyceridemia: Secondary | ICD-10-CM | POA: Diagnosis present

## 2019-03-13 DIAGNOSIS — E1122 Type 2 diabetes mellitus with diabetic chronic kidney disease: Secondary | ICD-10-CM | POA: Diagnosis present

## 2019-03-13 DIAGNOSIS — I4892 Unspecified atrial flutter: Secondary | ICD-10-CM | POA: Diagnosis not present

## 2019-03-13 DIAGNOSIS — Z7901 Long term (current) use of anticoagulants: Secondary | ICD-10-CM | POA: Diagnosis not present

## 2019-03-13 DIAGNOSIS — R040 Epistaxis: Secondary | ICD-10-CM | POA: Diagnosis present

## 2019-03-13 DIAGNOSIS — R809 Proteinuria, unspecified: Secondary | ICD-10-CM | POA: Diagnosis present

## 2019-03-13 DIAGNOSIS — Z7982 Long term (current) use of aspirin: Secondary | ICD-10-CM | POA: Diagnosis not present

## 2019-03-13 DIAGNOSIS — Z794 Long term (current) use of insulin: Secondary | ICD-10-CM | POA: Diagnosis not present

## 2019-03-13 DIAGNOSIS — T45515A Adverse effect of anticoagulants, initial encounter: Secondary | ICD-10-CM | POA: Diagnosis present

## 2019-03-13 DIAGNOSIS — E11319 Type 2 diabetes mellitus with unspecified diabetic retinopathy without macular edema: Secondary | ICD-10-CM | POA: Diagnosis present

## 2019-03-13 DIAGNOSIS — R339 Retention of urine, unspecified: Secondary | ICD-10-CM | POA: Diagnosis present

## 2019-03-13 DIAGNOSIS — D6832 Hemorrhagic disorder due to extrinsic circulating anticoagulants: Secondary | ICD-10-CM | POA: Diagnosis present

## 2019-03-13 DIAGNOSIS — N179 Acute kidney failure, unspecified: Secondary | ICD-10-CM | POA: Diagnosis present

## 2019-03-13 DIAGNOSIS — N183 Chronic kidney disease, stage 3 unspecified: Secondary | ICD-10-CM | POA: Diagnosis present

## 2019-03-13 DIAGNOSIS — Z20822 Contact with and (suspected) exposure to covid-19: Secondary | ICD-10-CM | POA: Diagnosis present

## 2019-03-13 DIAGNOSIS — E7800 Pure hypercholesterolemia, unspecified: Secondary | ICD-10-CM | POA: Diagnosis present

## 2019-03-13 DIAGNOSIS — I480 Paroxysmal atrial fibrillation: Secondary | ICD-10-CM | POA: Diagnosis present

## 2019-03-13 DIAGNOSIS — I70229 Atherosclerosis of native arteries of extremities with rest pain, unspecified extremity: Secondary | ICD-10-CM | POA: Diagnosis present

## 2019-03-13 DIAGNOSIS — I998 Other disorder of circulatory system: Secondary | ICD-10-CM | POA: Diagnosis present

## 2019-03-13 DIAGNOSIS — E46 Unspecified protein-calorie malnutrition: Secondary | ICD-10-CM | POA: Diagnosis present

## 2019-03-13 DIAGNOSIS — Z8249 Family history of ischemic heart disease and other diseases of the circulatory system: Secondary | ICD-10-CM | POA: Diagnosis not present

## 2019-03-13 DIAGNOSIS — I129 Hypertensive chronic kidney disease with stage 1 through stage 4 chronic kidney disease, or unspecified chronic kidney disease: Secondary | ICD-10-CM | POA: Diagnosis present

## 2019-03-13 LAB — BPAM RBC
Blood Product Expiration Date: 202102152359
ISSUE DATE / TIME: 202102091407
Unit Type and Rh: 9500

## 2019-03-13 LAB — RENAL FUNCTION PANEL
Albumin: 2 g/dL — ABNORMAL LOW (ref 3.5–5.0)
Anion gap: 10 (ref 5–15)
BUN: 64 mg/dL — ABNORMAL HIGH (ref 6–20)
CO2: 22 mmol/L (ref 22–32)
Calcium: 8.4 mg/dL — ABNORMAL LOW (ref 8.9–10.3)
Chloride: 105 mmol/L (ref 98–111)
Creatinine, Ser: 2.37 mg/dL — ABNORMAL HIGH (ref 0.61–1.24)
GFR calc Af Amer: 34 mL/min — ABNORMAL LOW (ref >60)
GFR calc non Af Amer: 30 mL/min — ABNORMAL LOW (ref >60)
Glucose, Bld: 311 mg/dL — ABNORMAL HIGH (ref 70–99)
Phosphorus: 3.6 mg/dL (ref 2.5–4.6)
Potassium: 4.9 mmol/L (ref 3.5–5.1)
Sodium: 137 mmol/L (ref 135–145)

## 2019-03-13 LAB — CBC
HCT: 28.2 % — ABNORMAL LOW (ref 39.0–52.0)
Hemoglobin: 9.3 g/dL — ABNORMAL LOW (ref 13.0–17.0)
MCH: 27.1 pg (ref 26.0–34.0)
MCHC: 33 g/dL (ref 30.0–36.0)
MCV: 82.2 fL (ref 80.0–100.0)
Platelets: 242 10*3/uL (ref 150–400)
RBC: 3.43 MIL/uL — ABNORMAL LOW (ref 4.22–5.81)
RDW: 14.3 % (ref 11.5–15.5)
WBC: 9.9 10*3/uL (ref 4.0–10.5)
nRBC: 0 % (ref 0.0–0.2)

## 2019-03-13 LAB — GLUCOSE, CAPILLARY
Glucose-Capillary: 296 mg/dL — ABNORMAL HIGH (ref 70–99)
Glucose-Capillary: 318 mg/dL — ABNORMAL HIGH (ref 70–99)
Glucose-Capillary: 305 mg/dL — ABNORMAL HIGH (ref 70–99)
Glucose-Capillary: 194 mg/dL — ABNORMAL HIGH (ref 70–99)

## 2019-03-13 LAB — TYPE AND SCREEN
ABO/RH(D): O NEG
Antibody Screen: NEGATIVE
Unit division: 0

## 2019-03-13 LAB — HEMOGLOBIN A1C
Hgb A1c MFr Bld: 9.7 % — ABNORMAL HIGH (ref 4.8–5.6)
Mean Plasma Glucose: 232 mg/dL

## 2019-03-13 MED ORDER — AMLODIPINE BESYLATE 10 MG PO TABS
10.0000 mg | ORAL_TABLET | Freq: Every day | ORAL | Status: DC
  Administered 2019-03-13 – 2019-03-14 (×2): 10 mg via ORAL
  Filled 2019-03-13 (×2): qty 1

## 2019-03-13 MED ORDER — INSULIN DETEMIR 100 UNIT/ML ~~LOC~~ SOLN
30.0000 [IU] | Freq: Every day | SUBCUTANEOUS | Status: DC
  Administered 2019-03-13: 30 [IU] via SUBCUTANEOUS
  Filled 2019-03-13 (×2): qty 0.3

## 2019-03-13 MED ORDER — CEPHALEXIN 250 MG PO CAPS
500.0000 mg | ORAL_CAPSULE | Freq: Three times a day (TID) | ORAL | Status: DC
  Administered 2019-03-13 – 2019-03-14 (×4): 500 mg via ORAL
  Filled 2019-03-13 (×4): qty 2

## 2019-03-13 MED ORDER — JUVEN PO PACK
1.0000 | PACK | Freq: Two times a day (BID) | ORAL | Status: DC
  Administered 2019-03-13 – 2019-03-14 (×2): 1 via ORAL
  Filled 2019-03-13 (×2): qty 1

## 2019-03-13 MED ORDER — HYDRALAZINE HCL 25 MG PO TABS
25.0000 mg | ORAL_TABLET | Freq: Four times a day (QID) | ORAL | Status: DC
  Administered 2019-03-14 (×3): 25 mg via ORAL
  Filled 2019-03-13 (×3): qty 1

## 2019-03-13 MED ORDER — INSULIN DETEMIR 100 UNIT/ML ~~LOC~~ SOLN
25.0000 [IU] | Freq: Every day | SUBCUTANEOUS | Status: DC
  Filled 2019-03-13: qty 0.25

## 2019-03-13 MED ORDER — INSULIN DETEMIR 100 UNIT/ML ~~LOC~~ SOLN
30.0000 [IU] | Freq: Every day | SUBCUTANEOUS | Status: DC
  Filled 2019-03-13: qty 0.3

## 2019-03-13 MED ORDER — PRO-STAT SUGAR FREE PO LIQD
30.0000 mL | Freq: Two times a day (BID) | ORAL | Status: DC
  Administered 2019-03-13: 11:00:00 30 mL via ORAL
  Filled 2019-03-13: qty 30

## 2019-03-13 MED ORDER — ADULT MULTIVITAMIN W/MINERALS CH
1.0000 | ORAL_TABLET | Freq: Every day | ORAL | Status: DC
  Administered 2019-03-13 – 2019-03-14 (×2): 1 via ORAL
  Filled 2019-03-13 (×2): qty 1

## 2019-03-13 NOTE — Progress Notes (Signed)
Subjective:   Patient states that he was diagnosed with diabetes over 20 years ago. Patent states he tries to measure his blood sugars every day and readings are usually in the high 100s or low 200s. Patient states he has diabetic retinopathy and has seen an eye doctor for this. 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  Objective:    Vital Signs (last 24 hours): Vitals:   03/12/19 1941 03/12/19 2323 03/13/19 0516 03/13/19 0851  BP: (!) 187/80 (!) 182/80 (!) 168/88 (!) 159/71  Pulse: 75 89 89 83  Resp: 20 16 16 20   Temp: 99.4 F (37.4 C) 98.3 F (36.8 C) 98.3 F (36.8 C) 97.8 F (36.6 C)  TempSrc: Oral Oral Oral Oral  SpO2: 99% 96% 98% 98%  Weight:   94.1 kg   Height:        Physical Exam: General Alert and answers questions appropriately, no acute distress  Cardiac Regular rate and rhythm, no murmurs, rubs, or gallops  Abdominal Soft, non-tender, without distention  Extremities No peripheral edema   Hepatic Function Latest Ref Rng & Units 03/13/2019 03/12/2019 02/08/2017  Total Protein 6.5 - 8.1 g/dL - 5.3(L) 7.1  Albumin 3.5 - 5.0 g/dL 2.0(L) 2.0(L) 1.6(L)  AST 15 - 41 U/L - 16 15  ALT 0 - 44 U/L - 15 11(L)  Alk Phosphatase 38 - 126 U/L - 51 154(H)  Total Bilirubin 0.3 - 1.2 mg/dL - 0.9 0.4  Bilirubin, Direct 0.0 - 0.2 mg/dL - 0.1 -   Urinalysis    Component Value Date/Time   COLORURINE YELLOW 03/12/2019 1250   APPEARANCEUR HAZY (A) 03/12/2019 1250   LABSPEC 1.013 03/12/2019 1250   PHURINE 6.0 03/12/2019 1250   GLUCOSEU 150 (A) 03/12/2019 1250   HGBUR NEGATIVE 03/12/2019 1250   BILIRUBINUR NEGATIVE 03/12/2019 1250   KETONESUR NEGATIVE 03/12/2019 1250   PROTEINUR 100 (A) 03/12/2019 1250   NITRITE NEGATIVE 03/12/2019 1250   LEUKOCYTESUR NEGATIVE 03/12/2019 1250   BMP Latest Ref Rng & Units 03/13/2019 03/12/2019 03/12/2019  Glucose 70 - 99 mg/dL 311(H) 346(H) 341(H)  BUN 6 - 20 mg/dL 64(H) 70(H) 70(H)  Creatinine 0.61 - 1.24 mg/dL 2.37(H) 2.44(H) 2.60(H)  Sodium 135  - 145 mmol/L 137 132(L) 135  Potassium 3.5 - 5.1 mmol/L 4.9 5.2(H) 6.0(H)  Chloride 98 - 111 mmol/L 105 105 108  CO2 22 - 32 mmol/L 22 19(L) 18(L)  Calcium 8.9 - 10.3 mg/dL 8.4(L) 8.3(L) 8.4(L)   CBC Latest Ref Rng & Units 03/13/2019 03/12/2019 03/12/2019  WBC 4.0 - 10.5 K/uL 9.9 - 13.2(H)  Hemoglobin 13.0 - 17.0 g/dL 9.3(L) 8.9(L) 8.4(L)  Hematocrit 39.0 - 52.0 % 28.2(L) 26.6(L) 27.3(L)  Platelets 150 - 400 K/uL 242 - 278   Ferritin: 172 Iron: 58  Urine Protein/Creatinine Ratio: 11.09 (Urine creatinine of 41.20 and protein of 457)  Glucose Range: 245-371, 296 at 0615  Assessment/Plan:   Principal Problem:   Acute on chronic renal failure (HCC) Active Problems:   Atrial flutter (HCC)   Type 2 diabetes mellitus with stage 3 chronic kidney disease (HCC)   Hyperkalemia   Epistaxis   Nephrotic range proteinuria  Patient is a 54 year old male with past medical history significant for diabetes mellitus, paroxysmal A. fib on Xarelto who presented today ER for epistaxis.  He was subsequently found to have an AKI with hyperkalemia on routine blood work.  He was admitted for further evaluation/management.  # Acute kidney injury with hyperkalemia Patient initially hyperkalemic to 6.9 with  EKG changes, given calcium gluconate, started on Lokelma 10 g TID. Renal ultrasound without evidence for obstruction, FeNA of 3.4% consistent with intrinsic renal disease, urinalysis with 100 protein, no RBCs.  PCP note from May 2020 notes 3+ proteinuria and need for 24 hour urine protein collection. Potassium 4.9 today.  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 * Followup ANA, serum protein electrophoresis * Renal followup scheduled per nephrology - will consider biopsy * Nephrology recommends thiazide and RAS blocker at some point in the future.  # Insulin-dependent diabetes mellitus: Home medications include glimepiride 4 mg once daily, Levemir 30 units daily, sliding  scale NovoLog, and Metformin 1000 mg twice daily. *Started yesterday on Levemir 20 units + sliding scale insulin, 20 units of short-acting utilized. Will increase Levemir to 30 units daily * Hemoglobin A1c pending - sent to [facility], should result today *Continue to hold home oral medications  # Hypertension: Initially hypotensive to 91/51, after receiving 2 L patient began hypertensive with systolics in 836D.  * Continue on hydralazine 10 mg Q6HR PO. Will switch Nifedipine 30 mg to amlodipine 10 mg daily  # Paroxysmal atrial fibrillation: Remains in sinus rhythm. No rate/rhythm control medications. CHADS2VASC of 3, holding xarelto given bleed  # Epistaxis resulting in symptomatic anemia: *Nasal packing placed yesterday, epistaxis has resolved.  1 unit PRBC was given to them yesterday, hemoglobin of 9.3 today from 8.4 yesterday. *Spoke with pharmacy regarding dosing and we will start cephalexin 500 mg every 8 hours.  If GFR goes below 30, will space out to every 12 hours.  PT/OT: Consulted Diet: Carb modified DVT Ppx: Lovenox 30 mg daily Dispo: Anticipated discharge in approximately 1-2 day(s).   Jeanmarie Hubert, MD 03/13/2019, 10:50 AM

## 2019-03-13 NOTE — Progress Notes (Signed)
[facility] Progress Note    Assessment/ Plan:   1. AoCKD: Suspect secondary to hemodynamically mediated kidney disease from hypotension/renal hypoperfusion + progressive kidney disease from uncontrolled HTN/DM. Awaiting further work up. Creatinine down trending overnight which is reassuring (Cr 2.72>2.6>2.4>2.37). UOP 2.5L overnight. Renal ultrasound without hydronephrosis, some cortical thinning on the right. Foley catheter placed without difficulty. FENa notable for intrinsic pathology. UA notable for proteinuria. PCR elevated at 11.09. SPEP/Light chains pending. Electrolytes stable (Na 137, K 4.9, Phos 3.6, Corrected Ca 10). Euvolemic on exam.  - follow up PTH, SPEP/Light Chains - cont Flomax - avoid nephrotoxic medications  - continue to monitor Cr overnight - Plan for outpatient nephology follow up  2. Hyperkalemia: K 6.9 on admission, 4.9 today. Suspect secondary to hyperglycemia  - d/c Lokelma 10mg  TID  3. Symptomatic Normocytic Normochromic Anemia 2/2 Epistaxis and CKD: Improved. Hgb 9.3 s/p 1U pRBC, Iron studies: Iron 58, TIBC 232, Sat 25%, Ferritin 172. Retic count 1.9. - cont Iron 325mg  QD  - continue to monitor Hgb  4. Severe Range Blood Pressures  Uncontrolled HTN: BP remains significantly elevated in the 200/80's. Hydralazine 10mg  QID added overnight + Nifedipine XL 30mg  QD. BP 168/88 this AM.  5. Nutrition  Protein Calorie Malnutrition  Hypoalbuminemia: Albumin 2.0 - Prostat BID - Carb Modified diet  6. A-fib on Xarelto - Xarelto held due ot acute bleed - per primary  7. Uncontrolled T2DM: - Insulin per primary  8. Dispo: pending further work up  Subjective:   Patient doing well this AM. Denies any concerns or complaints. Notes he is feeling better this AM.   Objective:   BP (!) 168/88 (BP Location: Right Arm)   Pulse 89   Temp 98.3 F (36.8 C) (Oral)   Resp 16   Ht 5\' 3"  (1.6 m)   Wt 94.1 kg   SpO2 98%   BMI 36.74 kg/m    Intake/Output Summary (Last 24 hours) at 03/13/2019 0800 Last data filed at 03/13/2019 5366 Gross per 24 hour  Intake 2965 ml  Output 2500 ml  Net 465 ml   Weight change:   Physical Exam: General: pleasant caucasian male, well nourished, well developed, in no acute distress with non-toxic appearance, lying comfortably in exam bed CV: regular rate and rhythm without murmurs, rubs, or gallops, no lower extremity edema Lungs: clear to auscultation bilaterally with normal work of breathing Abdomen: soft, non-tender, non-distended, normoactive bowel sounds Skin: warm, dry Extremities: warm and well perfused  Imaging: US RENAL  Result Date: 03/12/2019 CLINICAL DATA:  Acute renal injury EXAM: RENAL / URINARY TRACT ULTRASOUND COMPLETE COMPARISON:  None. FINDINGS: Right Kidney: Renal measurements: 10.6 x 5.6 x 6.2 cm = volume: 192 mL. Cortical thinning is noted without obstructive change. Left Kidney: Renal measurements: 13.6 x 6.3 x 7.0 = volume: 313 mL. Echogenicity within normal limits. No mass or hydronephrosis visualized. Bladder: Partially decompressed Other: None. IMPRESSION: Cortical thinning is noted on the right. No obstructive changes are noted. Electronically Signed   By: Inez Catalina M.D.   On: 03/12/2019 13:46    Labs: BMET Recent Labs  Lab 03/12/19 0936 03/12/19 1150 03/12/19 2020 03/13/19 0604  NA 132* 135 132* 137  K 6.9* 6.0* 5.2* 4.9  CL 101 108 105 105  CO2 20* 18* 19* 22  GLUCOSE 371* 341* 346* 311*  BUN 69* 70* 70* 64*  CREATININE 2.72* 2.60* 2.44* 2.37*  CALCIUM 8.2* 8.4* 8.3* 8.4*  PHOS  --   --   --  3.6   CBC Recent Labs  Lab 03/12/19 0601 03/12/19 0936 03/12/19 1300 03/12/19 2202  WBC 14.3* 11.1* 13.2*  --   HGB 9.0* 8.7* 8.4* 8.9*  HCT 27.8* 26.9* 27.3* 26.6*  MCV 85.5 84.3 87.8  --   PLT 293 273 278  --     Medications:    . albuterol  10 mg Nebulization Once  . Chlorhexidine Gluconate Cloth  6 each Topical Daily  . enoxaparin (LOVENOX)  injection  30 mg Subcutaneous Q24H  . ferrous sulfate  325 mg Oral BID  . hydrALAZINE  10 mg Oral Q6H  . insulin aspart  0-15 Units Subcutaneous TID WC  . insulin aspart  0-5 Units Subcutaneous QHS  . insulin detemir  25 Units Subcutaneous QHS  . loratadine  10 mg Oral Daily  . NIFEdipine  30 mg Oral Daily  . sodium zirconium cyclosilicate  10 g Oral TID  . tamsulosin  0.4 mg Oral Daily  . tranexamic acid  500 mg Topical Once     Orpah Cobb, DO [facility] Family Medicine Resident, PGY2 03/13/2019, 8:00 AM

## 2019-03-13 NOTE — Progress Notes (Signed)
Patient awake for shift report, denies complaints.  

## 2019-03-13 NOTE — Progress Notes (Addendum)
Initial Nutrition Assessment  DOCUMENTATION CODES:   Obesity unspecified  INTERVENTION:   -D/c Prostat -MVI with minerals daily -1 packet Juven BID, each packet provides 95 calories, 2.5 grams of protein (collagen), and 9.8 grams of carbohydrate (3 grams sugar); also contains 7 grams of L-arginine and L-glutamine, 300 mg vitamin C, 15 mg vitamin E, 1.2 mcg vitamin B-12, 9.5 mg zinc, 200 mg calcium, and 1.5 g  Calcium Beta-hydroxy-Beta-methylbutyrate to support wound healing  NUTRITION DIAGNOSIS:   Increased nutrient needs related to wound healing as evidenced by estimated needs.  GOAL:   Patient will meet greater than or equal to 90% of their needs  MONITOR:   PO intake, Supplement acceptance, Labs, Weight trends, Skin, I & O's  REASON FOR ASSESSMENT:   Consult Assessment of nutrition requirement/status  ASSESSMENT:   [name] is a 54 y.o male with HTN, DM, and PAF on Xarelto who presented to the ED with epistaxis. History was obtained via the patient and through chart review.  Pt admitted with epistaxis resulting in symptomatic anemia and AKI with hyperkalemia.   Reviewed I/O's: +465 ml x 24 hours  UOP: 2.5 L x 24 hours  Spoke with pt, who reports feeling very tired at time of visit, states he did not sleep well last night. He has a good appetite, he consumed all of his breakfast per his report (meal completions documented 80-100%).   PTA, pt with good appetite, consuming 3 meals per day per his report (pt reports meals consist of a meat, starch, and vegetable). Pt sleepy throughout interview and often fell asleep during interview.   Pt denies any weight loss. Pt estimates UBW is around 180-200#. No wt loss recorded over the past 2 years.   Albumin has a half-life of 21 days and is strongly affected by stress response and inflammatory process, therefore, do not expect to see an improvement in this lab value during acute hospitalization. When a patient presents with  low albumin, it is likely skewed due to the acute inflammatory response.  Unless it is suspected that patient had poor PO intake or malnutrition prior to admission, then RD should not be consulted solely for low albumin. Note that low albumin is no longer used to diagnose malnutrition; [facility] uses the new malnutrition guidelines published by the American Society for Parenteral and Enteral Nutrition (A.S.P.E.N.) and the Academy of Nutrition and Dietetics (AND).    Lab Results  Component Value Date   HGBA1C 9.7 (H) 03/12/2019   PTA DM medications are 30 units insulin detemir q HS, 0-15 units insulin aspart TID with meals, 4 mg amaryl daily, and 1000 mg metformin BID.   Labs reviewed: CBGS: 296-325 (inpatient orders for glycemic control are 0-15 units insulin aspart TID with meals, 0-5 units insulin aspart q HS, and 25 units insulin detemir q HS).   NUTRITION - FOCUSED PHYSICAL EXAM:    Most Recent Value  Orbital Region  No depletion  Upper Arm Region  No depletion  Thoracic and Lumbar Region  No depletion  Buccal Region  No depletion  Temple Region  No depletion  Clavicle Bone Region  No depletion  Clavicle and Acromion Bone Region  No depletion  Scapular Bone Region  No depletion  Dorsal Hand  No depletion  Patellar Region  No depletion  Anterior Thigh Region  No depletion  Posterior Calf Region  No depletion  Edema (RD Assessment)  Mild  Hair  Reviewed  Eyes  Reviewed  Mouth  Reviewed  Skin  Reviewed  Nails  Reviewed       Diet Order:   Diet Order            Diet Carb Modified Fluid consistency: Thin; Room service appropriate? Yes  Diet effective now              EDUCATION NEEDS:   No education needs have been identified at this time  Skin:  Skin Assessment: Skin Integrity Issues: Skin Integrity Issues:: DTI DTI: coccyx  Last BM:  03/11/19  Height:   Ht Readings from Last 1 Encounters:  03/12/19 5\' 3"  (1.6 m)    Weight:   Wt Readings from Last 1  Encounters:  03/13/19 94.1 kg    Ideal Body Weight:  56.4 kg  BMI:  Body mass index is 36.74 kg/m.  Estimated Nutritional Needs:   Kcal:  1750-1950  Protein:  85-100 grams  Fluid:  > 1.7 L    05/11/19, RD, LDN, CDCES Registered Dietitian II Certified Diabetes Care and Education Specialist Please refer to [facility] for RD and/or RD on-call/weekend/after hours pager

## 2019-03-13 NOTE — Progress Notes (Addendum)
Inpatient Diabetes Program Recommendations  AACE/ADA: New Consensus Statement on Inpatient Glycemic Control (2015)  Target Ranges:  Prepandial:   less than 140 mg/dL      Peak postprandial:   less than 180 mg/dL (1-2 hours)      Critically ill patients:  140 - 180 mg/dL   Results for [name], [name] (MRN [id number]) as of 03/13/2019 09:35  Ref. Range 03/12/2019 13:16 03/12/2019 16:09 03/12/2019 21:17 03/13/2019 06:15  Glucose-Capillary Latest Ref Range: 70 - 99 mg/dL 245 (H)  5 units NOVOLOG  304 (H)  11 units NOVOLOG  325 (H)  4 units NOVOLOG +  20 units LEVEMIR 296 (H)  8 units NOVOLOG     Admit with: Epistaxis resulting in symptomatic anemia/ Acute kidney injury with hyperkalemia  History: DM  Home DM Meds: Amaryl 2 mg Daily       Levemir 30 units Daily       Novolog 0-15 units TID per SSI        Metformin 1000 mg BID  Current Orders: Levemir 30 units QHS      Novolog Moderate Correction Scale/ SSI (0-15 units) TID AC + HS    PCP: Dr. Claris Gower (Family Medicine)  Current A1c pending.  Note Levemir started last PM (20 units)--Dose increased for tonight to 25 units QHS     MD- Per documentation, patient eating 80-100% meals so far.  May also consider starting Novolog Meal Coverage in addition to the Novolog Correction (SSI):  Novolog 4 units TID with meals  (Please add the following Hold Parameters: Hold if pt eats <50% of meal, Hold if pt NPO)     Addendum 2:30pm--Met w/ pt to discuss current A1c of 9.7%. Pt told me he thinks his last A1c was higher.  Stated he knows it's best if his A1c is closer to 7%.  Verified home meds with pt (see above).  Has CBG meter at home and uses Novolog in a sliding scale at home based on CBG.  Thinks he may have seen his PCP sometime in the middle of last year (couldn't remember the exact date).  Gets all DM meds refilled thru his PCP.  Discussed w/ pt that we have him on Levemir and Novolog right now and will make adjustments  based on his CBGs.  Pt did not have any diabetes questions for me at this time.    --Will follow patient during hospitalization--  Wyn Quaker RN, MSN, CDE Diabetes Coordinator Inpatient Glycemic Control Team Team Pager: [phone] (8a-5p)

## 2019-03-13 NOTE — Evaluation (Signed)
Physical Therapy Evaluation Patient Details Name: [name] MRN: [id number] DOB: [birth year] Today's Date: 03/13/2019   History of Present Illness  Pt is a 54 y/o male admitted secondary to nosebleed and acute on chronic renal failure. PMH includes a fib, DM, and CKD.   Clinical Impression  Pt admitted secondary to problem above with deficits below. Pt requiring min to min guard A for mobility tasks using RW. Pt with PF tightness at baseline and walks on toes. Had pt perform standing heel cord stretch using RW. Feel pt would benefit from outpatient PT at d/c. Will continue to follow acutely to maximize functional mobility independence and safety.     Follow Up Recommendations Outpatient PT;Supervision for mobility/OOB    Equipment Recommendations  None recommended by PT    Recommendations for Other Services       Precautions / Restrictions Precautions Precautions: Fall Restrictions Weight Bearing Restrictions: No      Mobility  Bed Mobility Overal bed mobility: Needs Assistance Bed Mobility: Supine to Sit;Sit to Supine     Supine to sit: Supervision Sit to supine: Supervision   General bed mobility comments: Supervision for safety. Increased time required to perform.   Transfers Overall transfer level: Needs assistance Equipment used: Rolling walker (2 wheeled) Transfers: Sit to/from Stand Sit to Stand: Min assist;From elevated surface         General transfer comment: Light min A for steadying assist.   Ambulation/Gait Ambulation/Gait assistance: Min guard Gait Distance (Feet): 30 Feet Assistive device: Rolling walker (2 wheeled) Gait Pattern/deviations: Step-through pattern;Decreased stride length;Decreased dorsiflexion - right;Decreased dorsiflexion - left Gait velocity: Decreased   General Gait Details: Pt walking on toes, but reports this is baseline. Min guard for safety. Educated about using RW for mobility vs rollator to increase safety.   Stairs            Wheelchair Mobility    Modified Rankin (Stroke Patients Only)       Balance Overall balance assessment: Needs assistance Sitting-balance support: No upper extremity supported;Feet supported Sitting balance-Leahy Scale: Good     Standing balance support: Bilateral upper extremity supported Standing balance-Leahy Scale: Poor Standing balance comment: Reliant on BUE support                              Pertinent Vitals/Pain Pain Assessment: No/denies pain    Home Living Family/patient expects to be discharged to:: Private residence Living Arrangements: Spouse/significant other Available Help at Discharge: Family Type of Home: House Home Access: Ramped entrance     Home Layout: One level Home Equipment: Environmental consultant - 2 wheels;Walker - 4 wheels;Cane - single point;Bedside commode;Shower seat      Prior Function Level of Independence: Independent with assistive device(s)         Comments: Was using rollator for ambulation.      Hand Dominance        Extremity/Trunk Assessment   Upper Extremity Assessment Upper Extremity Assessment: Defer to OT evaluation            Communication   Communication: No difficulties  Cognition Arousal/Alertness: Awake/alert Behavior During Therapy: WFL for tasks assessed/performed Overall Cognitive Status: Within Functional Limits for tasks assessed                                        General Comments  Exercises Other Exercises Other Exercises: Performed standing gastroc stretch X1 minute using RW. Pt unable to get heels to floor, but did show improvement in DF.    Assessment/Plan    PT Assessment Patient needs continued PT services  PT Problem List Decreased strength;Decreased balance;Decreased mobility;Decreased knowledge of use of DME;Decreased coordination       PT Treatment Interventions Gait training;Functional mobility training;Therapeutic activities;Therapeutic  exercise;Balance training;Patient/family education;DME instruction    PT Goals (Current goals can be found in the Care Plan section)  Acute Rehab PT Goals Patient Stated Goal: to go home PT Goal Formulation: With patient Time For Goal Achievement: 03/27/19 Potential to Achieve Goals: Good    Frequency Min 3X/week   Barriers to discharge        Co-evaluation               AM-PAC PT "6 Clicks" Mobility  Outcome Measure Help needed turning from your back to your side while in a flat bed without using bedrails?: None Help needed moving from lying on your back to sitting on the side of a flat bed without using bedrails?: A Little Help needed moving to and from a bed to a chair (including a wheelchair)?: A Little Help needed standing up from a chair using your arms (e.g., wheelchair or bedside chair)?: A Little Help needed to walk in hospital room?: A Little Help needed climbing 3-5 steps with a railing? : A Lot 6 Click Score: 18    End of Session Equipment Utilized During Treatment: Gait belt Activity Tolerance: Patient tolerated treatment well Patient left: in bed;with call bell/phone within reach;with family/visitor present Nurse Communication: Mobility status PT Visit Diagnosis: Unsteadiness on feet (R26.81);Muscle weakness (generalized) (M62.81)    Time: 1130-1150 PT Time Calculation (min) (ACUTE ONLY): 20 min   Charges:   PT Evaluation $PT Eval Moderate Complexity: 1 Mod          Farley Ly, PT, DPT  Acute Rehabilitation Services  Pager: [phone] Office: [phone]   Lehman Prom 03/13/2019, 2:00 PM

## 2019-03-13 NOTE — Progress Notes (Signed)
Patient's father at bedside. Notable dynamic.  Pt with increased BP during fathers visit. Observation made by PT as well.

## 2019-03-14 DIAGNOSIS — Z79899 Other long term (current) drug therapy: Secondary | ICD-10-CM

## 2019-03-14 LAB — PROTEIN ELECTROPHORESIS, SERUM
A/G Ratio: 0.7 (ref 0.7–1.7)
Albumin ELP: 2.1 g/dL — ABNORMAL LOW (ref 2.9–4.4)
Alpha-1-Globulin: 0.2 g/dL (ref 0.0–0.4)
Alpha-2-Globulin: 1.1 g/dL — ABNORMAL HIGH (ref 0.4–1.0)
Beta Globulin: 0.7 g/dL (ref 0.7–1.3)
Gamma Globulin: 0.9 g/dL (ref 0.4–1.8)
Globulin, Total: 3 g/dL (ref 2.2–3.9)
Total Protein ELP: 5.1 g/dL — ABNORMAL LOW (ref 6.0–8.5)

## 2019-03-14 LAB — GLUCOSE, CAPILLARY
Glucose-Capillary: 165 mg/dL — ABNORMAL HIGH (ref 70–99)
Glucose-Capillary: 301 mg/dL — ABNORMAL HIGH (ref 70–99)

## 2019-03-14 LAB — CBC
HCT: 26 % — ABNORMAL LOW (ref 39.0–52.0)
Hemoglobin: 8.5 g/dL — ABNORMAL LOW (ref 13.0–17.0)
MCH: 26.8 pg (ref 26.0–34.0)
MCHC: 32.7 g/dL (ref 30.0–36.0)
MCV: 82 fL (ref 80.0–100.0)
Platelets: 247 10*3/uL (ref 150–400)
RBC: 3.17 MIL/uL — ABNORMAL LOW (ref 4.22–5.81)
RDW: 14 % (ref 11.5–15.5)
WBC: 10.1 10*3/uL (ref 4.0–10.5)
nRBC: 0 % (ref 0.0–0.2)

## 2019-03-14 LAB — RENAL FUNCTION PANEL
Albumin: 1.9 g/dL — ABNORMAL LOW (ref 3.5–5.0)
Anion gap: 9 (ref 5–15)
BUN: 59 mg/dL — ABNORMAL HIGH (ref 6–20)
CO2: 23 mmol/L (ref 22–32)
Calcium: 8.5 mg/dL — ABNORMAL LOW (ref 8.9–10.3)
Chloride: 107 mmol/L (ref 98–111)
Creatinine, Ser: 2.47 mg/dL — ABNORMAL HIGH (ref 0.61–1.24)
GFR calc Af Amer: 33 mL/min — ABNORMAL LOW (ref >60)
GFR calc non Af Amer: 28 mL/min — ABNORMAL LOW (ref >60)
Glucose, Bld: 185 mg/dL — ABNORMAL HIGH (ref 70–99)
Phosphorus: 4.3 mg/dL (ref 2.5–4.6)
Potassium: 4.2 mmol/L (ref 3.5–5.1)
Sodium: 139 mmol/L (ref 135–145)

## 2019-03-14 LAB — PTH, INTACT AND CALCIUM
Calcium, Total (PTH): 8.1 mg/dL — ABNORMAL LOW (ref 8.7–10.2)
PTH: 25 pg/mL (ref 15–65)

## 2019-03-14 MED ORDER — HYDRALAZINE HCL 25 MG PO TABS: 25.0000 mg | ORAL_TABLET | Freq: Three times a day (TID) | ORAL | 0 refills | Status: AC

## 2019-03-14 MED ORDER — RIVAROXABAN 15 MG PO TABS: 15.0000 mg | ORAL_TABLET | Freq: Every day | ORAL | Status: DC

## 2019-03-14 MED ORDER — AMLODIPINE BESYLATE 10 MG PO TABS: 10.0000 mg | ORAL_TABLET | Freq: Every day | ORAL | 0 refills | Status: AC

## 2019-03-14 MED ORDER — XARELTO 20 MG PO TABS: 20.0000 mg | ORAL_TABLET | Freq: Every day | ORAL | 0 refills | Status: AC

## 2019-03-14 NOTE — Discharge Summary (Signed)
Name: [name] MRN: [id number] DOB: [birth year] 54 y.o. PCP: Kaleen Mask, MD  Date of Admission: 03/12/2019  4:41 AM Date of Discharge: 03/14/2019 Attending Physician: Tyson Alias, *  Discharge Diagnosis: 1. Epistaxis resulting in symptomatic anemia 2. Hypertension 3. Acute kidney injury with hyperkalemia 4. Diabetes mellitus  Discharge Medications: Allergies as of 03/14/2019   No Known Allergies     Medication List    STOP taking these medications   collagenase ointment Commonly known as: SANTYL   enoxaparin 40 MG/0.4ML injection Commonly known as: LOVENOX   Gerhardt's butt cream Crea   glimepiride 4 MG tablet Commonly known as: AMARYL   insulin glargine 100 UNIT/ML injection Commonly known as: LANTUS   metFORMIN 1000 MG tablet Commonly known as: GLUCOPHAGE     TAKE these medications   amLODipine 10 MG tablet Commonly known as: NORVASC Take 1 tablet (10 mg total) by mouth daily. Start taking on: March 15, 2019   aspirin EC 81 MG tablet Take 81 mg by mouth daily.   cephALEXin 500 MG capsule Commonly known as: KEFLEX Take 1 capsule (500 mg total) by mouth 2 (two) times daily.   cholestyramine light 4 g packet Commonly known as: PREVALITE Take 1 packet (4 g total) by mouth 3 (three) times daily.   citalopram 20 MG tablet Commonly known as: CELEXA Take 1 tablet (20 mg total) by mouth daily.   feeding supplement (PRO-STAT SUGAR FREE 64) Liqd Take 30 mLs by mouth 2 (two) times daily.   hydrALAZINE 25 MG tablet Commonly known as: APRESOLINE Take 1 tablet (25 mg total) by mouth 3 (three) times daily.   insulin aspart 100 UNIT/ML injection Commonly known as: novoLOG Inject 0-15 Units into the skin 3 (three) times daily with meals.   insulin aspart 100 UNIT/ML injection Commonly known as: novoLOG Inject 0-5 Units into the skin at bedtime.   Iron 325 (65 Fe) MG Tabs Take 65 mg by mouth 2 (two) times daily.   Levemir 100  UNIT/ML injection Generic drug: insulin detemir Inject 30 Units into the skin at bedtime.   loratadine 10 MG tablet Commonly known as: CLARITIN Take 10 mg by mouth daily.   multivitamin with minerals Tabs tablet Take 1 tablet by mouth daily.   niacin 500 MG tablet Take 500 mg by mouth 2 (two) times daily with a meal.   omeprazole 20 MG capsule Commonly known as: PRILOSEC Take 20 mg by mouth daily.   tamsulosin 0.4 MG Caps capsule Commonly known as: FLOMAX Take 0.4 mg by mouth daily.   vitamin C 1000 MG tablet Take 500 mg by mouth daily.   Xarelto 20 MG Tabs tablet Generic drug: rivaroxaban Take 1 tablet (20 mg total) by mouth daily. Start taking on: March 20, 2019 What changed: These instructions start on March 20, 2019. If you are unsure what to do until then, ask your doctor or other care provider.     Disposition and follow-up:   Mr.[name] was discharged from [facility] in Stable condition.  At the hospital follow up visit please address:  1.  Epistaxis. Ensure the patient follows up with ENT. He can restart his Xarelto and ASA 3 days after the nasal packing has been removed if he has no further bleeding. HTN/CKD. Ensure the patient follows up with nephrology and consider starting a ACE/ARB +/- a thiazide diuretic if the patients renal function improves further.   2.  Labs / imaging needed  at time of follow-up: BMP  3.  Pending labs/ test needing follow-up: IFE/Light chains, ANA  Follow-up Appointments: Follow-up Information    [name], MD In 3 days.   Specialty: Otolaryngology Contact information: [address] [place] [address] [phone]        [name], MD. Go on 04/23/2019.   Specialty: Nephrology Why: Appointment at 12:30 PM, you will have labs done 1 week prior to your visit. Contact information: [place] [address] [phone]        [name], MD. Schedule an  appointment as soon as possible for a visit.   Specialty: Family Medicine Contact information: [place] [address] [place] Hospital Course by problem list:  1. Epistaxis resulting in symptomatic anemia. [name] is a 54 y.o male with HTN, DM, and PAF on Xarelto who presented to the ED with epistaxis. Nasal packing was put in place and his Xarelto was held. Case was discussed with ENT and he will follow-up with them on 03/18/2019 and restart his Xarelto on 03/21/2019 if he does not have any recurrent bleeding.   2. Hypertension. Patient was found to be hypertensive on admission with sustatin BP >180/100. He was started on amlodipine and hydralazine with good response. He will need to follow-up for further management and consideration of starting an ACE/ARB +/- a thiazide diuretic.   3. Acute kidney injury with hyperkalemia. Patient was found to have an elevated creatinine of 2.72 with hyperkalemia on admission. His hyperkalemia was treated acutely and nephrology was consulted. His worsening renal function with nephrotic range proteinuria was felt to be secondary to uncontrolled DM and HTN. He will follow-up with nephrology for further management and repeat BMP.  4. Diabetes mellitus. Given the patient's worsening renal function we have discontinued his metformin. We have also discontinued his glipizide due to the risk of hypoglycemia in combination with insulin.   Discharge Vitals:   BP (!) 180/90 (BP Location: Right Arm) Comment: RN notified  Pulse 98   Temp 97.9 F (36.6 C) (Oral)   Resp 20   Ht 5\' 3"  (1.6 m)   Wt 93.1 kg   SpO2 99%   BMI 36.36 kg/m   Pertinent Labs, Studies, and Procedures:  BMP Latest Ref Rng & Units 03/14/2019 03/13/2019 03/13/2019  Glucose 70 - 99 mg/dL 185(H) 311(H) -  BUN 6 - 20 mg/dL 59(H) 64(H) -  Creatinine 0.61 - 1.24 mg/dL 2.47(H) 2.37(H) -  Sodium 135 - 145 mmol/L 139 137 -  Potassium 3.5 - 5.1 mmol/L 4.2 4.9 -    Chloride 98 - 111 mmol/L 107 105 -  CO2 22 - 32 mmol/L 23 22 -  Calcium 8.9 - 10.3 mg/dL 8.5(L) 8.4(L) 8.1(L)   CBC Latest Ref Rng & Units 03/14/2019 03/13/2019 03/12/2019  WBC 4.0 - 10.5 K/uL 10.1 9.9 -  Hemoglobin 13.0 - 17.0 g/dL 8.5(L) 9.3(L) 8.9(L)  Hematocrit 39.0 - 52.0 % 26.0(L) 28.2(L) 26.6(L)  Platelets 150 - 400 K/uL 247 242 -   Discharge Instructions: Discharge Instructions    Diet - low sodium heart healthy   Complete by: As directed    Discharge instructions   Complete by: As directed    Thank you for allowing [place] to provide your care. We have made some changes to your medications.   - STOP your metformin and Glipizide  - HOLD your Xarelto until you have followed up with  ENT on Monday, 2/15 - START Keflex and continue it until you have had your nasal packing removed   FOLLOW-UP with nephrology and ENT at the times listed below.   Increase activity slowly   Complete by: As directed     Signed: Levora Dredge, MD 03/14/2019, 2:17 PM

## 2019-03-14 NOTE — Progress Notes (Signed)
Subjective:  Patient seen at bedside. Patient states he feels well. Denies chest pain, difficulty breathing.  Objective:    Vital Signs (last 24 hours): Vitals:   03/13/19 1725 03/13/19 1946 03/14/19 0015 03/14/19 0342  BP: (!) 172/75 (!) 166/75 (!) 172/91 (!) 166/86  Pulse: 98 87 91 88  Resp: 16 18 19 18   Temp:  97.8 F (36.6 C) 98.7 F (37.1 C) 98.3 F (36.8 C)  TempSrc:  Oral Oral Oral  SpO2:  100% 98% 98%  Weight:   93.1 kg   Height:        Physical Exam: General Resting in bed, no acute distress  Pulmonary Breathing comfortably on room air, no cough, no distress   Neurology Alert and answers questions appropriately, no gross deficit    CBC Latest Ref Rng & Units 03/13/2019 03/12/2019 03/12/2019  WBC 4.0 - 10.5 K/uL 9.9 - 13.2(H)  Hemoglobin 13.0 - 17.0 g/dL 05/10/2019) 8.9(L) 8.4(L)  Hematocrit 39.0 - 52.0 % 28.2(L) 26.6(L) 27.3(L)  Platelets 150 - 400 K/uL 242 - 278   BMP Latest Ref Rng & Units 03/14/2019 03/13/2019 03/13/2019  Glucose 70 - 99 mg/dL 05/11/2019) 854(O) -  BUN 6 - 20 mg/dL 270(J) 50(K) -  Creatinine 0.61 - 1.24 mg/dL 93(G) 1.82(X) -  Sodium 135 - 145 mmol/L 139 137 -  Potassium 3.5 - 5.1 mmol/L 4.2 4.9 -  Chloride 98 - 111 mmol/L 107 105 -  CO2 22 - 32 mmol/L 23 22 -  Calcium 8.9 - 10.3 mg/dL 9.37(J) 6.9(C) 8.1(L)     Assessment/Plan:   Principal Problem:   Acute on chronic renal failure (HCC) Active Problems:   Atrial flutter (HCC)   Type 2 diabetes mellitus with stage 3 chronic kidney disease (HCC)   Hyperkalemia   Epistaxis   Nephrotic range proteinuria   Critical lower limb ischemia  Patient is a 54 year old male with past medical history significant for diabetes mellitus, paroxysmal A. fib who presented to the ER for epistaxis.  He was subsequently found to have an AKI with hyperkalemia on routine blood work.  He was admitted for further evaluation/management.  # Acute kidney injury with hyperkalemia: Work-up consistent with nephrotic syndrome  with urine protein/creatinine ratio 11.09.  Patient with normal potassium, stable creatinine today. * Renal followup scheduled per nephrology - will consider biopsy outpatient * Nephrology recommends thiazide and RAS blocker to be considered at followup appointment  # Insulin-dependent diabetes mellitus: Home medications include glimepiride 4 mg once daily, Levemir 30 units daily, sliding scale NovoLog, and Metformin 1000 mg twice daily *Continue patient on Levemir 30 units daily + sliding scale insulin.  Glucose well controlled overnight *Continue to hold home oral diabetic medications  # Hypertension: Patient has been hypertensive during admission.  We will continue patient on amlodipine 10 mg daily + hydralazine 25 mg every 6 hours (increased from 12.5 mg yesterday).  # Paroxysmal atrial fibrillation: Spoke with pharmacy regarding patient's degree of renal impairment and they recommended when restarting to use xarelto 15 mg once daily. Will restart couple days after ENT followup appointment when nasal packing removed.  # Epistaxis resulting in symptomatic anemia:  *Nasal packing placed on 2/9, epistaxis has resolved.  Patient is status post transfusion 1 unit PRBC.  Hemoglobin of 9.3 today.  Patient is currently on cephalexin 5 mg every 8 hours while nasal packing remains in place.  PT/OT: PT recommends outpatient PT, OT eval pending Diet: Carb modified DVT Ppx: Lovenox 30 mg daily Dispo: Anticipated discharge  today  Jeanmarie Hubert, MD 03/14/2019, 6:46 AM

## 2019-03-14 NOTE — Evaluation (Signed)
Occupational Therapy Evaluation Patient Details Name: [name] MRN: [id number] DOB: [birth year] Today's Date: 03/14/2019    History of Present Illness Pt is a 54 y/o male admitted secondary to nosebleed and acute on chronic renal failure. PMH includes a fib, DM, and CKD.    Clinical Impression   Patient lives in a single level house with ramp access with his spouse and teenage son. Patient is modified independent at baseline with use of rollator. Currently patient is modified independent with basic self care tasks with use of rolling walker, did not require any physical assist. Patient denies any questions/concerns at this time. Will discontinue acute OT services   Follow Up Recommendations  No OT follow up    Equipment Recommendations  None recommended by OT       Precautions / Restrictions Precautions Precautions: Fall Restrictions Weight Bearing Restrictions: No      Mobility Bed Mobility Overal bed mobility: Modified Independent             General bed mobility comments: increased time to elevate trunk to sitting, no physical assist needed  Transfers Overall transfer level: Modified independent Equipment used: Rolling walker (2 wheeled) Transfers: Sit to/from Stand Sit to Stand: Modified independent (Device/Increase time)         General transfer comment: increased time to power up from edge of bed    Balance Overall balance assessment: Needs assistance Sitting-balance support: No upper extremity supported;Feet supported Sitting balance-Leahy Scale: Good     Standing balance support: Bilateral upper extremity supported;During functional activity Standing balance-Leahy Scale: Poor Standing balance comment: Reliant on BUE support                            ADL either performed or assessed with clinical judgement   ADL Overall ADL's : At baseline;Modified independent                                       General ADL  Comments: patient able to perform LB dressing, sinkside g/h tasks, transfers without physical assist, no loss of balance noted                  Pertinent Vitals/Pain Pain Assessment: No/denies pain     Hand Dominance Right   Extremity/Trunk Assessment Upper Extremity Assessment Upper Extremity Assessment: Overall WFL for tasks assessed   Lower Extremity Assessment Lower Extremity Assessment: Defer to PT evaluation       Communication Communication Communication: No difficulties   Cognition Arousal/Alertness: Awake/alert Behavior During Therapy: WFL for tasks assessed/performed Overall Cognitive Status: Within Functional Limits for tasks assessed                                     General Comments  patient reports feeling a little below his baseline, but feels comfortable/confident in caring for himself. Has son at home to assist if needed            [place] expects to be discharged to:: Private residence Living Arrangements: Spouse/significant other;Children Available Help at Discharge: Family Type of Home: House Home Access: [place]: One level     Bathroom Shower/Tub: Occupational psychologist: Handicapped height [place] Accessibility: Yes How Accessible: Accessible via walker  Home Equipment: Walker - 2 wheels;Walker - 4 wheels;Cane - single point;Bedside commode;Shower seat          Prior Functioning/Environment Level of Independence: Independent with assistive device(s)        Comments: Was using rollator for ambulation.         OT Problem List: Decreased activity tolerance       AM-PAC OT "6 Clicks" Daily Activity     Outcome Measure Help from another person eating meals?: None Help from another person taking care of personal grooming?: None Help from another person toileting, which includes using toliet, bedpan, or urinal?: None Help from another person bathing  (including washing, rinsing, drying)?: None Help from another person to put on and taking off regular upper body clothing?: None Help from another person to put on and taking off regular lower body clothing?: None 6 Click Score: 24   End of Session Equipment Utilized During Treatment: Rolling walker Nurse Communication: Mobility status  Activity Tolerance: Patient tolerated treatment well Patient left: in bed;with call bell/phone within reach;with bed alarm set  OT Visit Diagnosis: Other abnormalities of gait and mobility (R26.89)                Time: 6387-5643 OT Time Calculation (min): 19 min Charges:  OT General Charges $OT Visit: 1 Visit OT Evaluation $OT Eval Moderate Complexity: 1 Mod  Myrtie Neither OT OT office: [phone]  Carmelia Roller 03/14/2019, 2:29 PM

## 2019-03-14 NOTE — Progress Notes (Signed)
[facility] Progress Note    Assessment/ Plan:   1. AoCKD: Appears secondary to progressive nephrosclerosis from uncontrolled HTN/DM based on nephrotic proteinuria and progressive rise in Cr. SPEP/Free light chains, ANA pending for further work up of alternative causes. Rise in creatinine overnight 2.37>2.47. BUN 64>59. PTH WNL.  Corrected calcium: 10.2. Phos 4.3. Remaining electrolytes stable. Euvolemic on exam. At this time believe patient can be discharged home.  Goal going forward is to optimize patient's diabetes and HTN. Follow up on pending labs and continue to follow closely with nephrology outpatient. Patient will need to ambulate prior to discharge.  - continue flomax - follow up SPEP/light chains, ANA - avoid nephrotoxic agents - outpatient nephrology follow up  - ambulate patient  2. Hyperkalemia: Resolved. K 4.2 this AM - continue to monitor   3. Normocytic Normochromic Anemia of CKD and acute bleed: Hgb pending. No further epistaxis since admission. - continue iron 325mg  QD - continue to monitor  5. HTN: BP continues to remain elevated 160-170's/80's. Currently on Hydralazine 10mg  QID and transitioned to Amlodipine 10mg . This will take several days to take affect. Continue to monitor.  Would benefit from ACE/ARB for kidney protection +/- thiazide once kidney function stabilizes.  6. Nutrition  Protein Calorie Malnutrition  Hypoalbuminemia: Albumin 1.9 - Prostat BID - Carb modified diet   6. A-fib on Xarelto - Xarelto held due ot acute bleed - per primary  7. Uncontrolled T2DM: - Insulin per primary  8. Dispo: pending further work up  Subjective:   Patient dong well this AM. Lying in bed comfortably. Denies any concerns or complaints.    Objective:   BP (!) 166/86 (BP Location: Right Arm)   Pulse 88   Temp 98.3 F (36.8 C) (Oral)   Resp 18   Ht 5\' 3"  (1.6 m)   Wt 93.1 kg   SpO2 98%   BMI 36.36 kg/m   Intake/Output Summary (Last 24  hours) at 03/14/2019 Last data filed at 03/14/2019 0330 Gross per 24 hour  Intake 960 ml  Output 3390 ml  Net -2430 ml   Weight change: -2.3 kg  Physical Exam: General: pleasant obese male, in no acute distress with non-toxic appearance, lying comfortably in exam bed HEENT: nasal packing in place CV: regular rate and rhythm without murmurs, rubs, or gallops, no lower extremity edema Lungs: clear to auscultation bilaterally with normal work of breathing Abdomen: soft, non-tender, non-distended, normoactive bowel sounds Skin: warm, dry Extremities: warm and well perfused  Imaging: RENAL  Result Date: 03/12/2019 CLINICAL DATA:  Acute renal injury EXAM: RENAL / URINARY TRACT ULTRASOUND COMPLETE COMPARISON:  None. FINDINGS: Right Kidney: Renal measurements: 10.6 x 5.6 x 6.2 cm = volume: 192 mL. Cortical thinning is noted without obstructive change. Left Kidney: Renal measurements: 13.6 x 6.3 x 7.0 = volume: 313 mL. Echogenicity within normal limits. No mass or hydronephrosis visualized. Bladder: Partially decompressed Other: None. IMPRESSION: Cortical thinning is noted on the right. No obstructive changes are noted. Electronically Signed   By: [id number] M.D.   On: 03/12/2019 13:46    Labs: BMET Recent Labs  Lab 03/12/19 0936 03/12/19 1150 03/12/19 2020 03/13/19 0604 03/14/19 0421  NA 132* 135 132* 137 139  K 6.9* 6.0* 5.2* 4.9 4.2  CL 101 108 105 105 107  CO2 20* 18* 19* 22 23  GLUCOSE 371* 341* 346* 311* 185*  BUN 69* 70* 70* 64* 59*  CREATININE 2.72* 2.60* 2.44* 2.37* 2.47*  CALCIUM  8.2* 8.4* 8.3* 8.4*  8.1* 8.5*  PHOS  --   --   --  3.6 4.3   CBC Recent Labs  Lab 03/12/19 0601 03/12/19 0601 03/12/19 0936 03/12/19 1300 03/12/19 2202 03/13/19 0604  WBC 14.3*  --  11.1* 13.2*  --  9.9  HGB 9.0*   < > 8.7* 8.4* 8.9* 9.3*  HCT 27.8*   < > 26.9* 27.3* 26.6* 28.2*  MCV 85.5  --  84.3 87.8  --  82.2  PLT 293  --  273 278  --  242   < > = values in this interval  not displayed.    Medications:    . albuterol  10 mg Nebulization Once  . amLODipine  10 mg Oral Daily  . cephALEXin  500 mg Oral Q8H  . Chlorhexidine Gluconate Cloth  6 each Topical Daily  . ferrous sulfate  325 mg Oral BID  . hydrALAZINE  25 mg Oral Q6H  . insulin aspart  0-15 Units Subcutaneous TID WC  . insulin aspart  0-5 Units Subcutaneous QHS  . insulin detemir  30 Units Subcutaneous QHS  . loratadine  10 mg Oral Daily  . multivitamin with minerals  1 tablet Oral Daily  . nutrition supplement (JUVEN)  1 packet Oral BID BM  . rivaroxaban  15 mg Oral QAC supper  . tamsulosin  0.4 mg Oral Daily  . tranexamic acid  500 mg Topical Once     Mina Marble, DO [facility] Family Medicine Resident, PGY2 03/14/2019, 8:06 AM

## 2019-03-14 NOTE — Progress Notes (Signed)
03/14/19 1622  PT Visit Information  Last PT Received On 03/14/19  Assistance Needed +1  History of Present Illness Pt is a 54 y/o male admitted secondary to nosebleed and acute on chronic renal failure. PMH includes a fib, DM, and CKD.   Subjective Data  Patient Stated Goal to go home  Precautions  Precautions Fall  Restrictions  Weight Bearing Restrictions No  Pain Assessment  Pain Assessment No/denies pain  Cognition  Arousal/Alertness Awake/alert  Behavior During Therapy WFL for tasks assessed/performed  Overall Cognitive Status Within Functional Limits for tasks assessed  Bed Mobility  Overal bed mobility Modified Independent  General bed mobility comments increased time to elevate trunk to sitting, no physical assist needed  Transfers  Overall transfer level Needs assistance  Equipment used Rolling walker (2 wheeled)  Transfers Sit to/from Stand  Sit to Stand Min assist  General transfer comment Min A for steadying assist. Increased time to power into standing.   Ambulation/Gait  Ambulation/Gait assistance Min guard  Gait Distance (Feet) 75 Feet  Assistive device Rolling walker (2 wheeled)  Gait Pattern/deviations Step-through pattern;Decreased stride length;Decreased dorsiflexion - right;Decreased dorsiflexion - left  General Gait Details Pt requiring min guard for safety. Walking on toes, however, pt reports this is baseline.   Gait velocity Decreased  Balance  Overall balance assessment Needs assistance  Sitting-balance support No upper extremity supported;Feet supported  Sitting balance-Leahy Scale Good  Standing balance support Bilateral upper extremity supported;During functional activity  Standing balance-Leahy Scale Poor  Standing balance comment Reliant on BUE support   Exercises  Exercises Other exercises  Other Exercises  Other Exercises Standing heel cord stretch X3 for 30 seconds each.   PT - End of Session  Equipment Utilized During Treatment Gait  belt  Activity Tolerance Patient tolerated treatment well  Patient left in bed;with call bell/phone within reach;with family/visitor present  Nurse Communication Mobility status   PT - Assessment/Plan  PT Plan Current plan remains appropriate  PT Visit Diagnosis Unsteadiness on feet (R26.81);Muscle weakness (generalized) (M62.81)  PT Frequency (ACUTE ONLY) Min 3X/week  Follow Up Recommendations Outpatient PT;Supervision for mobility/OOB  PT equipment None recommended by PT  AM-PAC PT "6 Clicks" Mobility Outcome Measure (Version 2)  Help needed turning from your back to your side while in a flat bed without using bedrails? 4  Help needed moving from lying on your back to sitting on the side of a flat bed without using bedrails? 4  Help needed moving to and from a bed to a chair (including a wheelchair)? 3  Help needed standing up from a chair using your arms (e.g., wheelchair or bedside chair)? 3  Help needed to walk in hospital room? 3  Help needed climbing 3-5 steps with a railing?  2  6 Click Score 19  Consider Recommendation of Discharge To: Home with [facility]  PT Goal Progression  Progress towards PT goals Progressing toward goals  Acute Rehab PT Goals  PT Goal Formulation With patient  Time For Goal Achievement 03/27/19  Potential to Achieve Goals Good  PT Time Calculation  PT Start Time (ACUTE ONLY) 1433  PT Stop Time (ACUTE ONLY) 1447  PT Time Calculation (min) (ACUTE ONLY) 14 min  PT General Charges  $$ ACUTE PT VISIT 1 Visit  PT Treatments  $Gait Training 8-22 mins   Pt progressing towards goals. Continues to require min guard for safety with mobility using RW. Was able to increase ambulation distance. Continue to feel pt would benefit  from outpatient PT services. Will continue to follow acutely to maximize functional mobility independence and safety.   Farley Ly, PT, DPT  Acute Rehabilitation Services  Pager: [phone] Office: [phone]

## 2019-03-15 LAB — ANA: Anti Nuclear Antibody (ANA): NEGATIVE

## 2019-03-15 LAB — KAPPA/LAMBDA LIGHT CHAINS
Kappa free light chain: 73.2 mg/L — ABNORMAL HIGH (ref 3.3–19.4)
Kappa, lambda light chain ratio: 1.42 (ref 0.26–1.65)
Lambda free light chains: 51.4 mg/L — ABNORMAL HIGH (ref 5.7–26.3)

## 2019-05-09 ENCOUNTER — Other Ambulatory Visit: Payer: Self-pay | Admitting: Internal Medicine

## 2019-05-15 ENCOUNTER — Other Ambulatory Visit: Payer: Self-pay | Admitting: Internal Medicine

## 2019-05-23 ENCOUNTER — Encounter (INDEPENDENT_AMBULATORY_CARE_PROVIDER_SITE_OTHER): Payer: BC Managed Care – PPO | Admitting: Ophthalmology

## 2019-05-23 DIAGNOSIS — E113513 Type 2 diabetes mellitus with proliferative diabetic retinopathy with macular edema, bilateral: Secondary | ICD-10-CM

## 2019-05-23 DIAGNOSIS — E11311 Type 2 diabetes mellitus with unspecified diabetic retinopathy with macular edema: Secondary | ICD-10-CM

## 2019-05-23 DIAGNOSIS — H43813 Vitreous degeneration, bilateral: Secondary | ICD-10-CM

## 2019-05-23 DIAGNOSIS — H35033 Hypertensive retinopathy, bilateral: Secondary | ICD-10-CM

## 2019-05-23 DIAGNOSIS — I10 Essential (primary) hypertension: Secondary | ICD-10-CM

## 2019-06-19 ENCOUNTER — Encounter (INDEPENDENT_AMBULATORY_CARE_PROVIDER_SITE_OTHER): Payer: BC Managed Care – PPO | Admitting: Ophthalmology

## 2019-06-19 ENCOUNTER — Other Ambulatory Visit: Payer: Self-pay

## 2019-06-19 DIAGNOSIS — H35033 Hypertensive retinopathy, bilateral: Secondary | ICD-10-CM

## 2019-06-19 DIAGNOSIS — E11311 Type 2 diabetes mellitus with unspecified diabetic retinopathy with macular edema: Secondary | ICD-10-CM

## 2019-06-19 DIAGNOSIS — I10 Essential (primary) hypertension: Secondary | ICD-10-CM

## 2019-06-19 DIAGNOSIS — E113513 Type 2 diabetes mellitus with proliferative diabetic retinopathy with macular edema, bilateral: Secondary | ICD-10-CM | POA: Diagnosis not present

## 2019-06-19 DIAGNOSIS — H43813 Vitreous degeneration, bilateral: Secondary | ICD-10-CM

## 2019-07-17 ENCOUNTER — Other Ambulatory Visit: Payer: Self-pay

## 2019-07-17 ENCOUNTER — Encounter (INDEPENDENT_AMBULATORY_CARE_PROVIDER_SITE_OTHER): Payer: Medicare HMO | Admitting: Ophthalmology

## 2019-07-17 DIAGNOSIS — H35033 Hypertensive retinopathy, bilateral: Secondary | ICD-10-CM

## 2019-07-17 DIAGNOSIS — I10 Essential (primary) hypertension: Secondary | ICD-10-CM | POA: Diagnosis not present

## 2019-07-17 DIAGNOSIS — E11311 Type 2 diabetes mellitus with unspecified diabetic retinopathy with macular edema: Secondary | ICD-10-CM | POA: Diagnosis not present

## 2019-07-17 DIAGNOSIS — H43813 Vitreous degeneration, bilateral: Secondary | ICD-10-CM

## 2019-07-17 DIAGNOSIS — E113513 Type 2 diabetes mellitus with proliferative diabetic retinopathy with macular edema, bilateral: Secondary | ICD-10-CM

## 2019-08-14 ENCOUNTER — Other Ambulatory Visit: Payer: Self-pay

## 2019-08-14 ENCOUNTER — Encounter (INDEPENDENT_AMBULATORY_CARE_PROVIDER_SITE_OTHER): Payer: Medicare HMO | Admitting: Ophthalmology

## 2019-08-14 DIAGNOSIS — E11311 Type 2 diabetes mellitus with unspecified diabetic retinopathy with macular edema: Secondary | ICD-10-CM | POA: Diagnosis not present

## 2019-08-14 DIAGNOSIS — I10 Essential (primary) hypertension: Secondary | ICD-10-CM | POA: Diagnosis not present

## 2019-08-14 DIAGNOSIS — H35033 Hypertensive retinopathy, bilateral: Secondary | ICD-10-CM | POA: Diagnosis not present

## 2019-08-14 DIAGNOSIS — E113513 Type 2 diabetes mellitus with proliferative diabetic retinopathy with macular edema, bilateral: Secondary | ICD-10-CM

## 2019-08-14 DIAGNOSIS — H43813 Vitreous degeneration, bilateral: Secondary | ICD-10-CM

## 2019-09-12 ENCOUNTER — Encounter (INDEPENDENT_AMBULATORY_CARE_PROVIDER_SITE_OTHER): Payer: Medicare HMO | Admitting: Ophthalmology

## 2019-09-12 ENCOUNTER — Other Ambulatory Visit: Payer: Self-pay

## 2019-09-12 DIAGNOSIS — E11311 Type 2 diabetes mellitus with unspecified diabetic retinopathy with macular edema: Secondary | ICD-10-CM

## 2019-09-12 DIAGNOSIS — E113593 Type 2 diabetes mellitus with proliferative diabetic retinopathy without macular edema, bilateral: Secondary | ICD-10-CM

## 2019-09-12 DIAGNOSIS — I10 Essential (primary) hypertension: Secondary | ICD-10-CM | POA: Diagnosis not present

## 2019-09-12 DIAGNOSIS — H35033 Hypertensive retinopathy, bilateral: Secondary | ICD-10-CM | POA: Diagnosis not present

## 2019-09-12 DIAGNOSIS — H43813 Vitreous degeneration, bilateral: Secondary | ICD-10-CM

## 2019-10-10 ENCOUNTER — Encounter (INDEPENDENT_AMBULATORY_CARE_PROVIDER_SITE_OTHER): Payer: Medicare HMO | Admitting: Ophthalmology

## 2019-10-10 ENCOUNTER — Other Ambulatory Visit: Payer: Self-pay

## 2019-10-10 DIAGNOSIS — E113513 Type 2 diabetes mellitus with proliferative diabetic retinopathy with macular edema, bilateral: Secondary | ICD-10-CM | POA: Diagnosis not present

## 2019-10-10 DIAGNOSIS — I10 Essential (primary) hypertension: Secondary | ICD-10-CM

## 2019-10-10 DIAGNOSIS — H43813 Vitreous degeneration, bilateral: Secondary | ICD-10-CM

## 2019-10-10 DIAGNOSIS — E11311 Type 2 diabetes mellitus with unspecified diabetic retinopathy with macular edema: Secondary | ICD-10-CM

## 2019-10-10 DIAGNOSIS — H35033 Hypertensive retinopathy, bilateral: Secondary | ICD-10-CM | POA: Diagnosis not present

## 2019-11-06 ENCOUNTER — Other Ambulatory Visit: Payer: Self-pay

## 2019-11-06 ENCOUNTER — Encounter (INDEPENDENT_AMBULATORY_CARE_PROVIDER_SITE_OTHER): Payer: Medicare HMO | Admitting: Ophthalmology

## 2019-11-06 DIAGNOSIS — E113513 Type 2 diabetes mellitus with proliferative diabetic retinopathy with macular edema, bilateral: Secondary | ICD-10-CM

## 2019-11-06 DIAGNOSIS — I10 Essential (primary) hypertension: Secondary | ICD-10-CM

## 2019-11-06 DIAGNOSIS — H35033 Hypertensive retinopathy, bilateral: Secondary | ICD-10-CM

## 2019-11-06 DIAGNOSIS — H43813 Vitreous degeneration, bilateral: Secondary | ICD-10-CM

## 2019-11-06 DIAGNOSIS — E11311 Type 2 diabetes mellitus with unspecified diabetic retinopathy with macular edema: Secondary | ICD-10-CM

## 2019-12-04 ENCOUNTER — Encounter (INDEPENDENT_AMBULATORY_CARE_PROVIDER_SITE_OTHER): Payer: Medicare HMO | Admitting: Ophthalmology

## 2019-12-04 ENCOUNTER — Other Ambulatory Visit: Payer: Self-pay

## 2019-12-04 DIAGNOSIS — E113513 Type 2 diabetes mellitus with proliferative diabetic retinopathy with macular edema, bilateral: Secondary | ICD-10-CM | POA: Diagnosis not present

## 2019-12-04 DIAGNOSIS — H35033 Hypertensive retinopathy, bilateral: Secondary | ICD-10-CM | POA: Diagnosis not present

## 2019-12-04 DIAGNOSIS — I10 Essential (primary) hypertension: Secondary | ICD-10-CM | POA: Diagnosis not present

## 2019-12-04 DIAGNOSIS — E11311 Type 2 diabetes mellitus with unspecified diabetic retinopathy with macular edema: Secondary | ICD-10-CM

## 2019-12-04 DIAGNOSIS — H43811 Vitreous degeneration, right eye: Secondary | ICD-10-CM

## 2020-01-01 ENCOUNTER — Other Ambulatory Visit: Payer: Self-pay

## 2020-01-01 ENCOUNTER — Encounter (INDEPENDENT_AMBULATORY_CARE_PROVIDER_SITE_OTHER): Payer: Medicare HMO | Admitting: Ophthalmology

## 2020-01-01 DIAGNOSIS — I10 Essential (primary) hypertension: Secondary | ICD-10-CM | POA: Diagnosis not present

## 2020-01-01 DIAGNOSIS — H43811 Vitreous degeneration, right eye: Secondary | ICD-10-CM

## 2020-01-01 DIAGNOSIS — E113513 Type 2 diabetes mellitus with proliferative diabetic retinopathy with macular edema, bilateral: Secondary | ICD-10-CM

## 2020-01-01 DIAGNOSIS — H35033 Hypertensive retinopathy, bilateral: Secondary | ICD-10-CM | POA: Diagnosis not present

## 2020-01-01 DIAGNOSIS — E11311 Type 2 diabetes mellitus with unspecified diabetic retinopathy with macular edema: Secondary | ICD-10-CM | POA: Diagnosis not present

## 2020-02-05 ENCOUNTER — Encounter (INDEPENDENT_AMBULATORY_CARE_PROVIDER_SITE_OTHER): Payer: Medicare HMO | Admitting: Ophthalmology

## 2020-02-05 ENCOUNTER — Other Ambulatory Visit: Payer: Self-pay

## 2020-02-05 DIAGNOSIS — H35033 Hypertensive retinopathy, bilateral: Secondary | ICD-10-CM

## 2020-02-05 DIAGNOSIS — I10 Essential (primary) hypertension: Secondary | ICD-10-CM

## 2020-02-05 DIAGNOSIS — E113513 Type 2 diabetes mellitus with proliferative diabetic retinopathy with macular edema, bilateral: Secondary | ICD-10-CM

## 2020-02-05 DIAGNOSIS — H43811 Vitreous degeneration, right eye: Secondary | ICD-10-CM | POA: Diagnosis not present

## 2020-02-11 ENCOUNTER — Encounter (INDEPENDENT_AMBULATORY_CARE_PROVIDER_SITE_OTHER): Payer: Medicare HMO | Admitting: Ophthalmology

## 2020-03-31 DEATH — deceased

## 2020-06-30 IMAGING — US US RENAL
1 series · 14 of 25 positions shown · non-contrast
Comparison: None.

CLINICAL DATA: Acute renal injury

EXAM:
RENAL / URINARY TRACT ULTRASOUND COMPLETE

[Series 1: us renal · 14 of 33 slices shown]
[im 1/33]
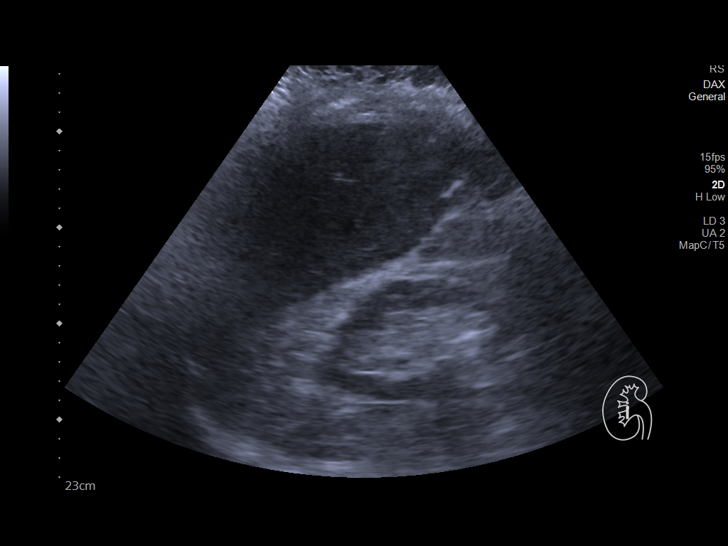
[im 3/33]
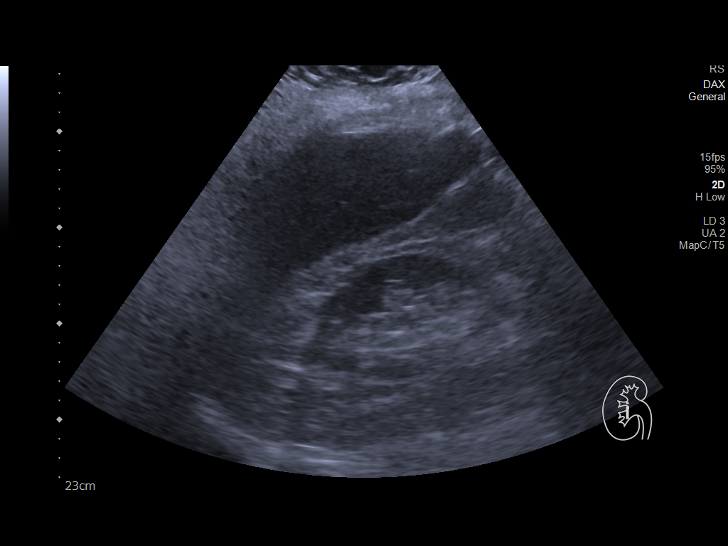
[im 6/33]
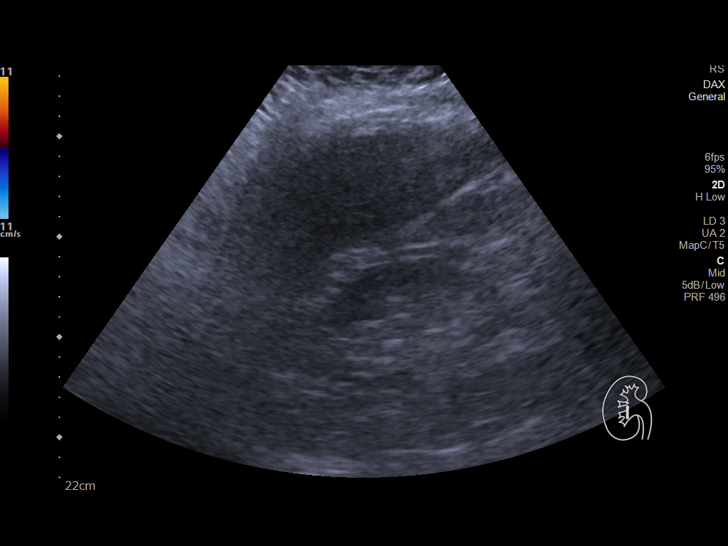
[im 9/33]
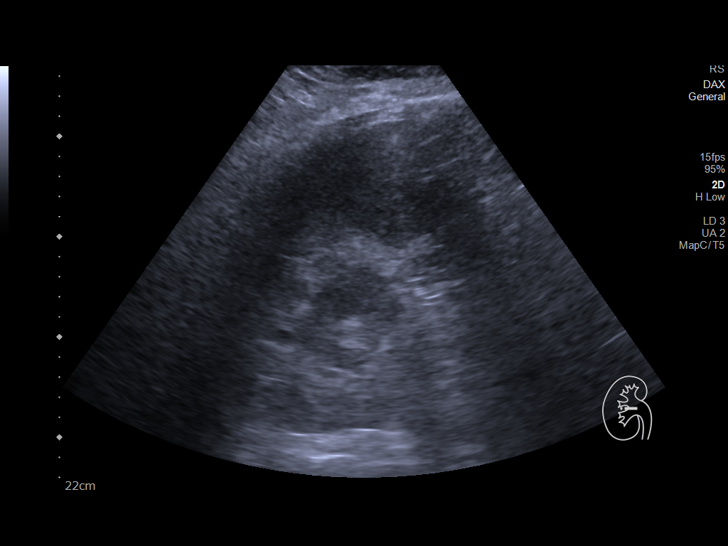
[im 11/33]
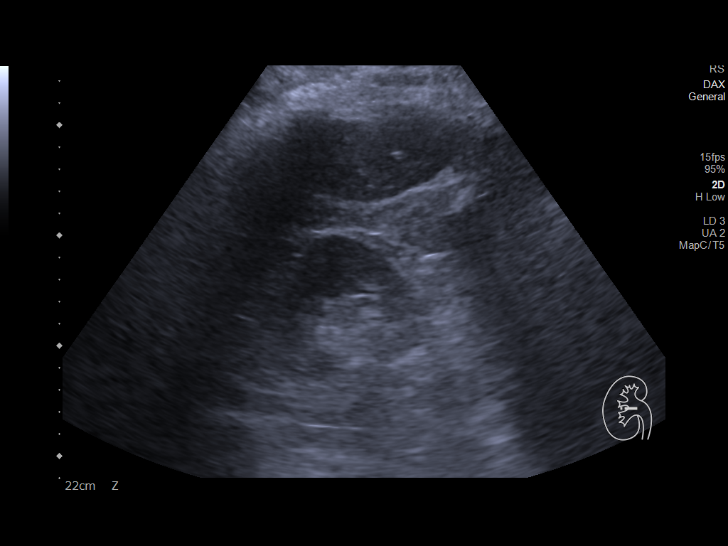
[im 13/33]
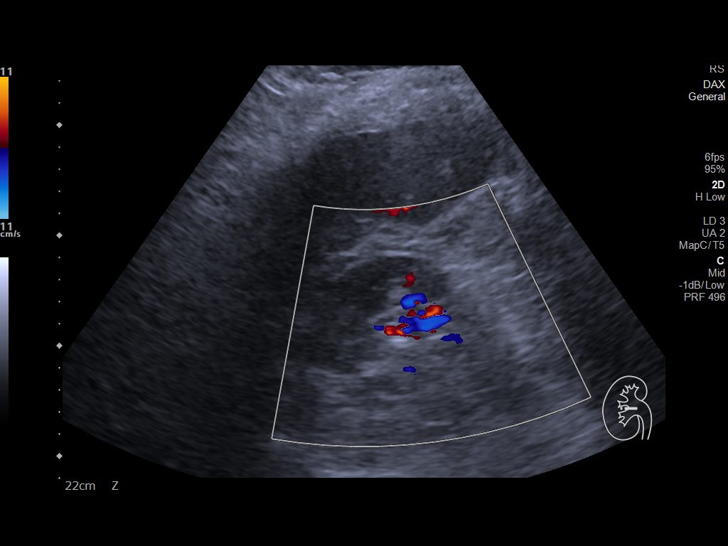
[im 15/33]
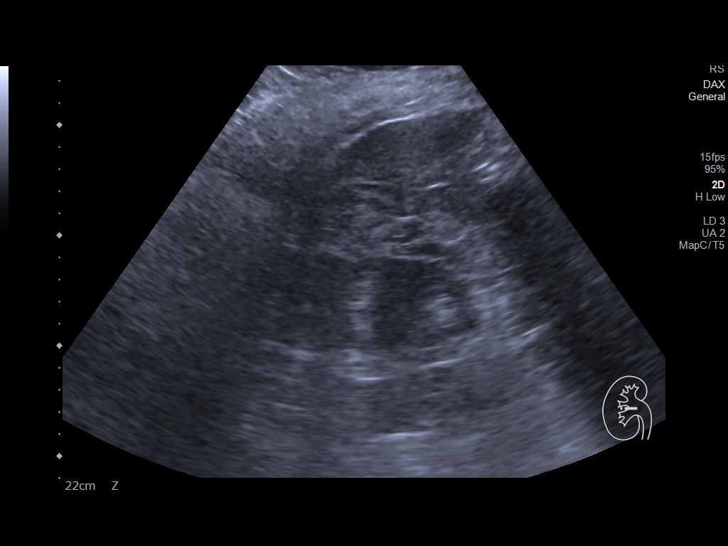
[im 18/33]
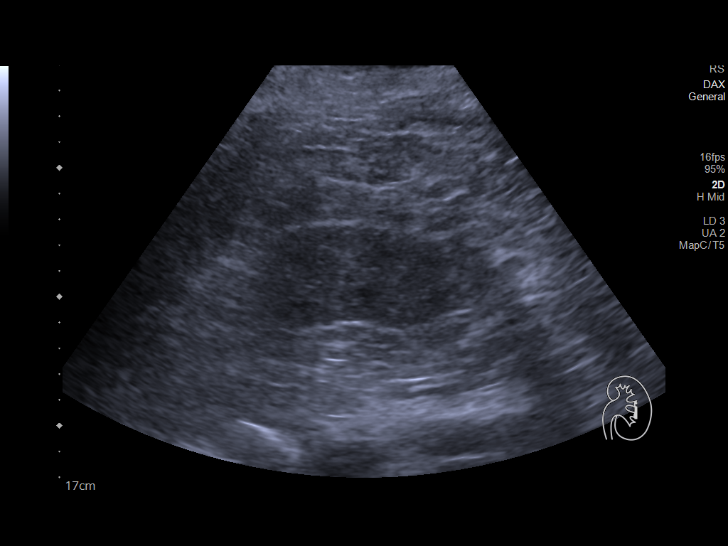
[im 21/33]
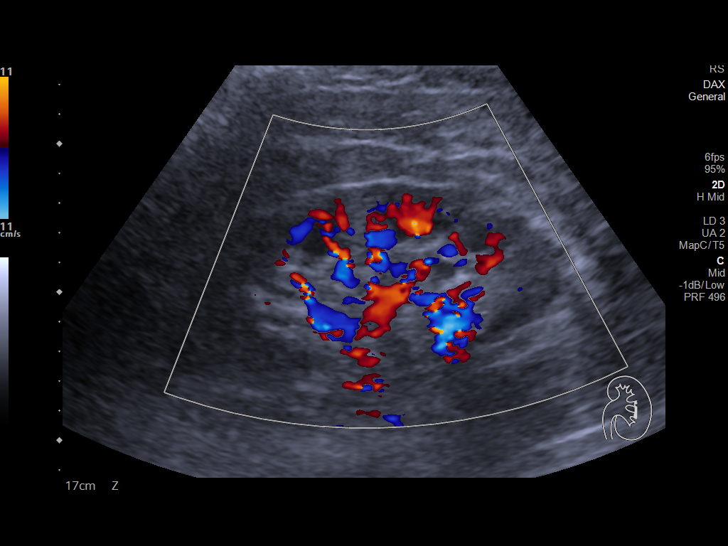
[im 22/33]
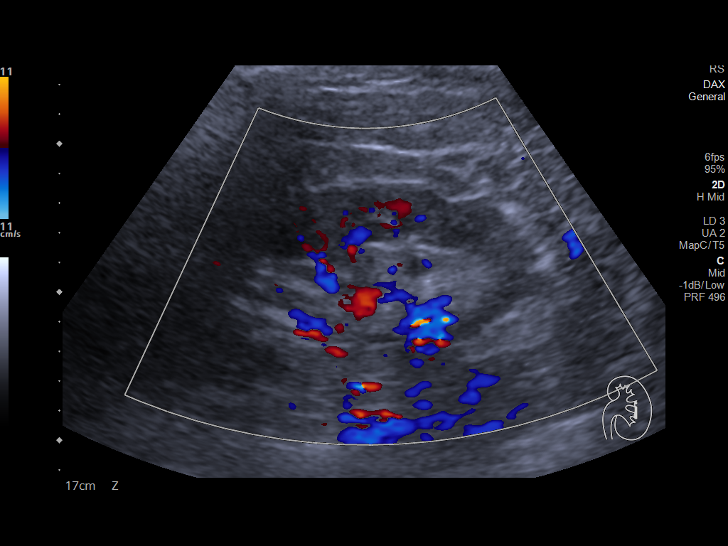
[im 25/33]
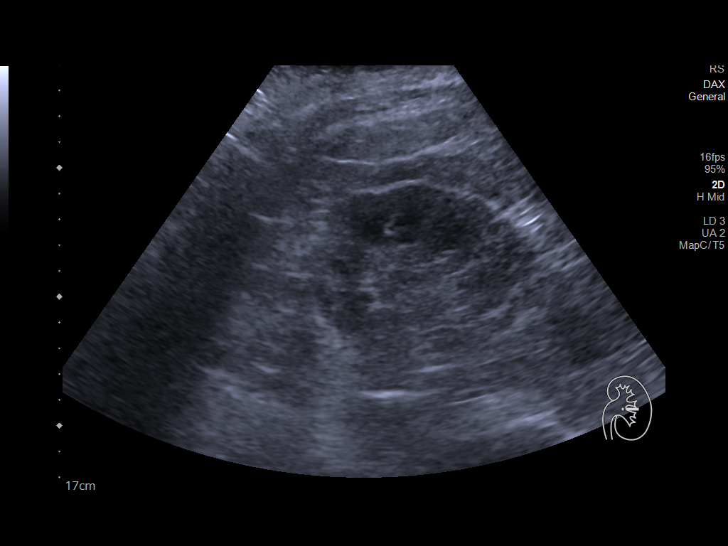
[im 27/33]
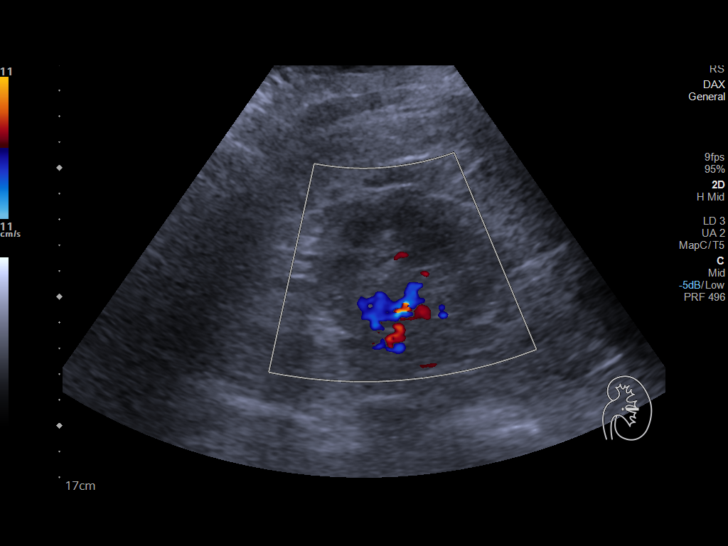
[im 30/33]
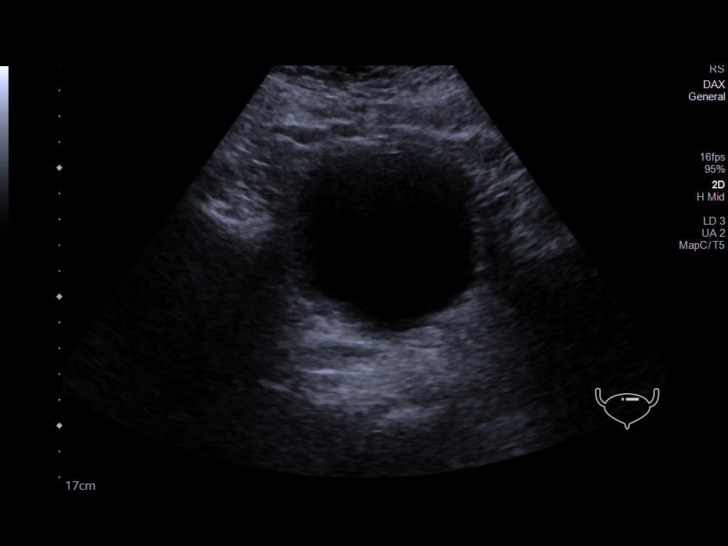
[im 33/33]
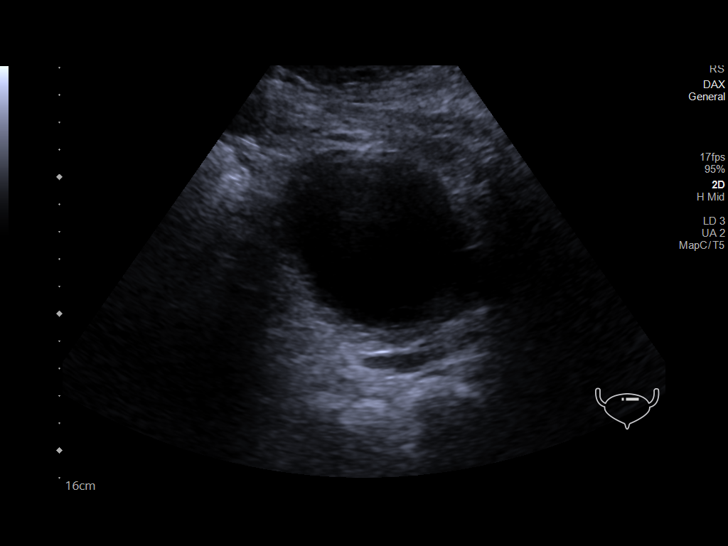

[14 of 25 positions shown; findings below may reference images not displayed]

FINDINGS: Right Kidney:

Renal measurements: 10.6 x 5.6 x 6.2 cm = volume: 192 mL. Cortical
thinning is noted without obstructive change.

Left Kidney:

Renal measurements: 13.6 x 6.3 x 7.0 = volume: 313 mL. Echogenicity
within normal limits. No mass or hydronephrosis visualized.

Bladder:

Partially decompressed

Other:

None.
IMPRESSION: Cortical thinning is noted on the right. No obstructive changes are
noted.
